# Patient Record
Sex: Male | Born: 1970 | Race: Black or African American | Hispanic: No | Marital: Single | Smoking: Former smoker
Health system: Southern US, Community
[De-identification: ages and names within clinical notes are randomized; demographics above are authoritative.]

## PROBLEM LIST (undated history)

## (undated) DIAGNOSIS — I10 Essential (primary) hypertension: Secondary | ICD-10-CM

## (undated) DIAGNOSIS — N400 Enlarged prostate without lower urinary tract symptoms: Secondary | ICD-10-CM

## (undated) DIAGNOSIS — F1910 Other psychoactive substance abuse, uncomplicated: Secondary | ICD-10-CM

## (undated) HISTORY — PX: HEART TRANSPLANT: SHX268

---

## 2019-08-17 ENCOUNTER — Other Ambulatory Visit: Payer: Self-pay

## 2019-08-17 ENCOUNTER — Emergency Department (HOSPITAL_COMMUNITY)
Admission: EM | Admit: 2019-08-17 | Discharge: 2019-08-17 | Disposition: A | Discharge status: Home / Self Care | Payer: Self-pay | Attending: Emergency Medicine | Admitting: Emergency Medicine

## 2019-08-17 ENCOUNTER — Emergency Department (HOSPITAL_COMMUNITY): Payer: Self-pay

## 2019-08-17 ENCOUNTER — Encounter (HOSPITAL_COMMUNITY): Payer: Self-pay | Admitting: *Deleted

## 2019-08-17 DIAGNOSIS — N134 Hydroureter: Secondary | ICD-10-CM | POA: Insufficient documentation

## 2019-08-17 DIAGNOSIS — N1330 Unspecified hydronephrosis: Secondary | ICD-10-CM | POA: Insufficient documentation

## 2019-08-17 DIAGNOSIS — R112 Nausea with vomiting, unspecified: Secondary | ICD-10-CM | POA: Insufficient documentation

## 2019-08-17 DIAGNOSIS — N1339 Other hydronephrosis: Secondary | ICD-10-CM

## 2019-08-17 DIAGNOSIS — R109 Unspecified abdominal pain: Secondary | ICD-10-CM

## 2019-08-17 DIAGNOSIS — R10817 Generalized abdominal tenderness: Secondary | ICD-10-CM | POA: Insufficient documentation

## 2019-08-17 LAB — LIPASE, BLOOD: Lipase: 37 U/L (ref 11–51)

## 2019-08-17 LAB — URINALYSIS, ROUTINE W REFLEX MICROSCOPIC
Bilirubin Urine: NEGATIVE
Glucose, UA: NEGATIVE mg/dL
Hgb urine dipstick: NEGATIVE
Ketones, ur: 5 mg/dL — AB
Leukocytes,Ua: NEGATIVE
Nitrite: NEGATIVE
Protein, ur: NEGATIVE mg/dL
Specific Gravity, Urine: 1.008 (ref 1.005–1.030)
pH: 5 (ref 5.0–8.0)

## 2019-08-17 LAB — COMPREHENSIVE METABOLIC PANEL
ALT: 47 U/L — ABNORMAL HIGH (ref 0–44)
AST: 61 U/L — ABNORMAL HIGH (ref 15–41)
Albumin: 4.1 g/dL (ref 3.5–5.0)
Alkaline Phosphatase: 42 U/L (ref 38–126)
Anion gap: 12 (ref 5–15)
BUN: 26 mg/dL — ABNORMAL HIGH (ref 6–20)
CO2: 23 mmol/L (ref 22–32)
Calcium: 9 mg/dL (ref 8.9–10.3)
Chloride: 100 mmol/L (ref 98–111)
Creatinine, Ser: 2.08 mg/dL — ABNORMAL HIGH (ref 0.61–1.24)
GFR calc Af Amer: 42 mL/min — ABNORMAL LOW (ref >60)
GFR calc non Af Amer: 36 mL/min — ABNORMAL LOW (ref >60)
Glucose, Bld: 111 mg/dL — ABNORMAL HIGH (ref 70–99)
Potassium: 3.9 mmol/L (ref 3.5–5.1)
Sodium: 135 mmol/L (ref 135–145)
Total Bilirubin: 0.6 mg/dL (ref 0.3–1.2)
Total Protein: 7.2 g/dL (ref 6.5–8.1)

## 2019-08-17 LAB — CBC
HCT: 44.8 % (ref 39.0–52.0)
Hemoglobin: 15.2 g/dL (ref 13.0–17.0)
MCH: 28.4 pg (ref 26.0–34.0)
MCHC: 33.9 g/dL (ref 30.0–36.0)
MCV: 83.6 fL (ref 80.0–100.0)
Platelets: 250 10*3/uL (ref 150–400)
RBC: 5.36 MIL/uL (ref 4.22–5.81)
RDW: 13 % (ref 11.5–15.5)
WBC: 9.2 10*3/uL (ref 4.0–10.5)
nRBC: 0 % (ref 0.0–0.2)

## 2019-08-17 MED ORDER — MORPHINE SULFATE (PF) 4 MG/ML IV SOLN
4.0000 mg | Freq: Once | INTRAVENOUS | Status: AC
  Administered 2019-08-17: 4 mg via INTRAVENOUS
  Filled 2019-08-17: qty 1

## 2019-08-17 MED ORDER — ONDANSETRON HCL 4 MG/2ML IJ SOLN
4.0000 mg | Freq: Once | INTRAMUSCULAR | Status: AC
  Administered 2019-08-17: 4 mg via INTRAVENOUS
  Filled 2019-08-17: qty 2

## 2019-08-17 MED ORDER — SODIUM CHLORIDE 0.9 % IV BOLUS
1000.0000 mL | Freq: Once | INTRAVENOUS | Status: AC
  Administered 2019-08-17: 1000 mL via INTRAVENOUS

## 2019-08-17 MED ORDER — TAMSULOSIN HCL 0.4 MG PO CAPS: 0.4000 mg | ORAL_CAPSULE | Freq: Every day | ORAL | 0 refills | Status: DC

## 2019-08-17 MED ORDER — SODIUM CHLORIDE 0.9% FLUSH
3.0000 mL | Freq: Once | INTRAVENOUS | Status: AC
  Administered 2019-08-17: 3 mL via INTRAVENOUS

## 2019-08-17 NOTE — ED Notes (Signed)
Pt stated that he was able to urinate 2 separate times; he estimates each time to be approximately 10 to 20 mL

## 2019-08-17 NOTE — ED Notes (Signed)
Patient verbalizes understanding of discharge instructions. Opportunity for questioning and answers were provided. Armband removed by staff. Patient discharged from ED.  

## 2019-08-17 NOTE — ED Provider Notes (Addendum)
[facility] EMERGENCY DEPARTMENT Provider Note   CSN: [id number] Arrival date & time: 08/17/19  1427     History Chief Complaint  Patient presents with  . Abdominal Pain    Dylan Chaney is a 49 y.o. male.  49 y.o male with no PMH presents to the ED with a chief complaint of bilateral flank pain, abdominal pain for the past 3 days.  Patient reports sudden onset of symptoms on Thursday night, reports he has been unable to eat, has had multiple episodes of nonbloody, nonbilious emesis.  Describes a sharp sensation from bilateral back radiating into lower abdomen, there is no exacerbating or alleviating factors.  He also endorses difficulty urinating, states he is noted his urine to be darker in color in the past couple of days, has not seen any visible blood.  Has taken Tylenol to help with symptoms without much improvement.  Endorses no fever but does report feeling chills while at night.  No prior surgical history of his abdomen.  No prior history of kidney stones.  No pain no discharge, he is currently not sexually active.  No diarrhea.  The history is provided by the patient and medical records.       History reviewed. No pertinent past medical history.  There are no problems to display for this patient.   History reviewed. No pertinent surgical history.     No family history on file.  Social History   Tobacco Use  . Smoking status: Never Smoker  . Smokeless tobacco: Never Used  Substance Use Topics  . Alcohol use: Never  . Drug use: Not on file    Home Medications Prior to Admission medications   Not on File    Allergies    Patient has no known allergies.  Review of Systems   Review of Systems  Constitutional: Positive for appetite change (Anorexia.) and chills. Negative for fever.  HENT: Negative for sore throat.   Respiratory: Negative for shortness of breath.   Cardiovascular: Negative for chest pain.  Gastrointestinal: Positive for  abdominal pain, nausea and vomiting. Negative for blood in stool, constipation and diarrhea.  Genitourinary: Positive for difficulty urinating and flank pain. Negative for discharge, hematuria, penile pain, penile swelling, scrotal swelling and testicular pain.  Musculoskeletal: Negative for back pain.  Skin: Negative for pallor and wound.  Neurological: Negative for light-headedness and headaches.  All other systems reviewed and are negative.   Physical Exam Updated Vital Signs BP 133/74 (BP Location: Right Arm)   Pulse 63   Temp 99.5 F (37.5 C) (Oral)   Resp 14   Ht 6\' 1"  (1.854 m)   Wt 86.2 kg   SpO2 99%   BMI 25.07 kg/m   Physical Exam Vitals and nursing note reviewed.  Constitutional:      Appearance: He is well-developed. He is not ill-appearing or toxic-appearing.  HENT:     Head: Normocephalic and atraumatic.  Cardiovascular:     Rate and Rhythm: Normal rate.  Pulmonary:     Effort: Pulmonary effort is normal.     Breath sounds: No wheezing or rales.     Comments: No wheezing, rhonchi, rales. Abdominal:     General: Abdomen is flat. Bowel sounds are increased. There is no distension.     Palpations: Abdomen is soft.     Tenderness: There is abdominal tenderness in the right lower quadrant, suprapubic area and left lower quadrant. There is right CVA tenderness and guarding. There is no left  CVA tenderness. Negative signs include McBurney's sign.     Hernia: No hernia is present.     Comments: Bowel sounds are hyperactive, generalized tenderness to palpation on abdomen region, focally more so in the lower abdomen.  Skin:    General: Skin is warm and dry.  Neurological:     Mental Status: He is alert and oriented to person, place, and time.     ED Results / Procedures / Treatments   Labs (all labs ordered are listed, but only abnormal results are displayed) Labs Reviewed  COMPREHENSIVE METABOLIC PANEL - Abnormal; Notable for the following components:       Result Value   Glucose, Bld 111 (*)    BUN 26 (*)    Creatinine, Ser 2.08 (*)    AST 61 (*)    ALT 47 (*)    GFR calc non Af Amer 36 (*)    GFR calc Af Amer 42 (*)    All other components within normal limits  URINALYSIS, ROUTINE W REFLEX MICROSCOPIC - Abnormal; Notable for the following components:   Ketones, ur 5 (*)    All other components within normal limits  LIPASE, BLOOD  CBC    EKG None  Radiology CT Renal Stone Study  Result Date: 08/17/2019 CLINICAL DATA:  Bilateral flank pain and dysuria. EXAM: CT ABDOMEN AND PELVIS WITHOUT CONTRAST TECHNIQUE: Multidetector CT imaging of the abdomen and pelvis was performed following the standard protocol without IV contrast. COMPARISON:  None. FINDINGS: Lower chest: No acute abnormality. Hepatobiliary: No focal liver abnormality is seen. No gallstones, gallbladder wall thickening, or biliary dilatation. Pancreas: Unremarkable. No pancreatic ductal dilatation or surrounding inflammatory changes. Spleen: Normal in size without focal abnormality. Adrenals/Urinary Tract: Adrenal glands are unremarkable. Kidneys are normal in size without focal lesions. Mild to moderate severity bilateral hydronephrosis and hydroureter are seen. There is moderate to marked severity distention of the urinary bladder. Stomach/Bowel: Stomach is within normal limits. Appendix appears normal. No evidence of bowel wall thickening, distention, or inflammatory changes. Noninflamed diverticula are seen throughout the sigmoid colon. Vascular/Lymphatic: There is mild to moderate severity calcification of the abdominal aorta. No enlarged abdominal or pelvic lymph nodes. Reproductive: Moderate to marked severity prostate gland enlargement is noted. Other: No abdominal wall hernia or abnormality. No abdominopelvic ascites. Musculoskeletal: No acute or significant osseous findings. IMPRESSION: 1. Mild to moderate severity bilateral hydronephrosis and hydroureter, with moderate to marked  severity distention of the urinary bladder. 2. Moderate to marked severity prostate gland enlargement. 3. Noninflamed sigmoid diverticulosis. 4. Aortic atherosclerosis. Aortic Atherosclerosis (ICD10-I70.0). Electronically Signed   By: Virgina Norfolk M.D.   On: 08/17/2019 17:50    Procedures Procedures (including critical care time)  Medications Ordered in ED Medications  sodium chloride flush (NS) 0.9 % injection 3 mL (3 mLs Intravenous Given 08/17/19 1707)  sodium chloride 0.9 % bolus 1,000 mL (0 mLs Intravenous Stopped 08/17/19 1812)  morphine 4 MG/ML injection 4 mg (4 mg Intravenous Given 08/17/19 1706)  ondansetron (ZOFRAN) injection 4 mg (4 mg Intravenous Given 08/17/19 1706)  morphine 4 MG/ML injection 4 mg (4 mg Intravenous Given 08/17/19 1847)    ED Course  I have reviewed the triage vital signs and the nursing notes.  Pertinent labs & imaging results that were available during my care of the patient were reviewed by me and considered in my medical decision making (see chart for details).    MDM Rules/Calculators/A&P   Patient with no past medical history presents to  the ED with complaints of bilateral flank pain, lower abdominal pain which began 3 days ago.  Reports his pain is consistent, no alleviating or exacerbating factors.  Has taken Tylenol without improvement in symptoms.  Also endorses darker urine, reports he is having difficulty urinating and reports that he feels like he has to strain.  He has not seen any visible blood in his urine.  Has no prior history of kidney stones, no prior history of UTI.  He is currently not sexually active and has not been in several years, denies any penile drainage or testicular swelling.  During evaluation he is overall nontoxic, ill-appearing.  Does have right CVA tenderness on examination, generalized abdominal tenderness on palpation along with lower abdomen pain.  Lungs are clear to auscultation, rest of his exam is benign.  Urinalysis today  showed no nitrites, leukocytes, signs of infection.  CBC is without any leukocytosis, no signs of anemia.  He has had repetitive vomiting for the past 3 days along with some anorexia and no prior surgery to his abdomen.  Some suspicion for kidney stone versus, appendicitis versus diverticulitis.  He was provided with morphine, fluids, Zofran.   CT Renal showed:  1. Mild to moderate severity bilateral hydronephrosis and  hydroureter, with moderate to marked severity distention of the  urinary bladder.  2. Moderate to marked severity prostate gland enlargement.  3. Noninflamed sigmoid diverticulosis.  4. Aortic atherosclerosis.   6:56 PM Spoke to Dr. Clancy [facility], who recommended insertion of Foley catheter at this time.  He will also need to go home on tamsulosin 0.4 mg for the next 14 days.  Patient is to call the office on Monday morning and schedule an appointment for further discussion of his bladder distention and hydronephrosis management.   10:16 PM delayed in placing catheter on patient, he is refusing procedure at this time.  I have discussed with him that he will likely worsen if we are unable to drain his distended bladder, he would like to wait until he sees a specialist.  I discussed with Dr. Laverle Patter the catheter placement along with start him on tamsulosin. He is refusing care at this time, patient will be leaving the ED AGAINST MEDICAL ADVICE.  Vitals remained stable.   Portions of this note were generated with Scientist, clinical (histocompatibility and immunogenetics). Dictation errors may occur despite best attempts at proofreading.  Final Clinical Impression(s) / ED Diagnoses Final diagnoses:  Bilateral flank pain  Other hydronephrosis    Rx / DC Orders ED Discharge Orders         Ordered    tamsulosin (FLOMAX) 0.4 MG CAPS capsule  Daily,   Status:  Discontinued     08/17/19 1923           Claude Manges, PA-C 08/17/19 2218    Claude Manges, PA-C 08/17/19 2219    Claude Manges,  PA-C 08/17/19 2219    Charlynne Pander, MD 08/17/19 2228

## 2019-08-17 NOTE — ED Notes (Addendum)
Pt refused foley catheter by Joe the RN and this tech who assisted. This tech educated the pt on the purpose of the foley, and the consequences of further bladder complications if the foley was not inserted. When asked if pt felt pain in the abdomen, pt said " yes I noticed it four days ago." When the RN inserts the foley, pt grabs his penis and told us to stop and the sterile field was no longer sterile. Pt is getting dressed.

## 2019-08-17 NOTE — ED Notes (Signed)
Pt transported to CT ?

## 2019-08-17 NOTE — ED Triage Notes (Signed)
The pt is c/o abd pain abd flank pain diff urination with n and v  For 2-3 days

## 2019-08-17 NOTE — Discharge Instructions (Addendum)
Your CT today showed bilateral hydronephrosis.  We attempted to place a catheter to drain the urine, you refused this procedure.  Dr. Vevelyn Royals number is attached to your chart, please schedule an appointment tomorrow in order to be seen this upcoming week.

## 2019-10-15 ENCOUNTER — Encounter: Payer: Self-pay | Admitting: *Deleted

## 2019-10-15 NOTE — Congregational Nurse Program (Signed)
  Dept: [phone]   Congregational Nurse Program Note  Date of Encounter: 10/15/2019  Past Medical History: No past medical history on file.  Encounter Details:  CNP Questionnaire - 10/15/19 1057      Questionnaire   Patient Status Not Applicable    Race Black or African American    Location Patient Served At [employer] Not Applicable    Uninsured Uninsured (NEW 1x/quarter)    Food No food insecurities    Housing/Utilities No permanent housing    Transportation No transportation needs    Interpersonal Safety Yes, feel physically and emotionally safe where you currently live    Medication No medication insecurities    Medical Provider Yes    Referrals Not Applicable    ED Visit Averted Not Applicable    Life-Saving Intervention Made Not Applicable          Client's name was highlighted to see mental health nurse. When writer asked to see client, he reported he had not made an appointment but would have his blood pressure taken. Blood pressure checked WNL. Client sees Lavinia Sharps as his PCP.

## 2020-12-15 ENCOUNTER — Inpatient Hospital Stay (HOSPITAL_COMMUNITY)
Admission: EM | Admit: 2020-12-15 | Discharge: 2021-01-05 | DRG: 215 | Disposition: A | Discharge status: Home Health | Payer: Medicaid Other | Attending: Internal Medicine | Admitting: Internal Medicine

## 2020-12-15 ENCOUNTER — Emergency Department (HOSPITAL_COMMUNITY): Payer: Medicaid Other

## 2020-12-15 ENCOUNTER — Encounter (HOSPITAL_COMMUNITY): Payer: Self-pay | Admitting: Oncology

## 2020-12-15 ENCOUNTER — Other Ambulatory Visit: Payer: Self-pay

## 2020-12-15 DIAGNOSIS — K567 Ileus, unspecified: Secondary | ICD-10-CM

## 2020-12-15 DIAGNOSIS — I509 Heart failure, unspecified: Secondary | ICD-10-CM

## 2020-12-15 DIAGNOSIS — I471 Supraventricular tachycardia: Secondary | ICD-10-CM | POA: Diagnosis present

## 2020-12-15 DIAGNOSIS — Z7901 Long term (current) use of anticoagulants: Secondary | ICD-10-CM

## 2020-12-15 DIAGNOSIS — I130 Hypertensive heart and chronic kidney disease with heart failure and stage 1 through stage 4 chronic kidney disease, or unspecified chronic kidney disease: Secondary | ICD-10-CM | POA: Diagnosis present

## 2020-12-15 DIAGNOSIS — R778 Other specified abnormalities of plasma proteins: Secondary | ICD-10-CM | POA: Diagnosis present

## 2020-12-15 DIAGNOSIS — N1832 Chronic kidney disease, stage 3b: Secondary | ICD-10-CM | POA: Diagnosis present

## 2020-12-15 DIAGNOSIS — R54 Age-related physical debility: Secondary | ICD-10-CM | POA: Diagnosis present

## 2020-12-15 DIAGNOSIS — J9601 Acute respiratory failure with hypoxia: Secondary | ICD-10-CM | POA: Diagnosis not present

## 2020-12-15 DIAGNOSIS — I472 Ventricular tachycardia, unspecified: Secondary | ICD-10-CM | POA: Diagnosis not present

## 2020-12-15 DIAGNOSIS — N170 Acute kidney failure with tubular necrosis: Secondary | ICD-10-CM | POA: Diagnosis present

## 2020-12-15 DIAGNOSIS — I9589 Other hypotension: Secondary | ICD-10-CM | POA: Diagnosis not present

## 2020-12-15 DIAGNOSIS — K7200 Acute and subacute hepatic failure without coma: Secondary | ICD-10-CM

## 2020-12-15 DIAGNOSIS — N189 Chronic kidney disease, unspecified: Secondary | ICD-10-CM | POA: Diagnosis present

## 2020-12-15 DIAGNOSIS — E43 Unspecified severe protein-calorie malnutrition: Secondary | ICD-10-CM | POA: Insufficient documentation

## 2020-12-15 DIAGNOSIS — D62 Acute posthemorrhagic anemia: Secondary | ICD-10-CM | POA: Diagnosis not present

## 2020-12-15 DIAGNOSIS — K5900 Constipation, unspecified: Secondary | ICD-10-CM | POA: Diagnosis not present

## 2020-12-15 DIAGNOSIS — I82403 Acute embolism and thrombosis of unspecified deep veins of lower extremity, bilateral: Secondary | ICD-10-CM | POA: Diagnosis present

## 2020-12-15 DIAGNOSIS — I2699 Other pulmonary embolism without acute cor pulmonale: Secondary | ICD-10-CM | POA: Diagnosis present

## 2020-12-15 DIAGNOSIS — E872 Acidosis, unspecified: Secondary | ICD-10-CM | POA: Diagnosis not present

## 2020-12-15 DIAGNOSIS — E871 Hypo-osmolality and hyponatremia: Secondary | ICD-10-CM | POA: Diagnosis not present

## 2020-12-15 DIAGNOSIS — Z419 Encounter for procedure for purposes other than remedying health state, unspecified: Secondary | ICD-10-CM

## 2020-12-15 DIAGNOSIS — R7989 Other specified abnormal findings of blood chemistry: Secondary | ICD-10-CM | POA: Diagnosis present

## 2020-12-15 DIAGNOSIS — Z5900 Homelessness unspecified: Secondary | ICD-10-CM

## 2020-12-15 DIAGNOSIS — F1910 Other psychoactive substance abuse, uncomplicated: Secondary | ICD-10-CM | POA: Diagnosis present

## 2020-12-15 DIAGNOSIS — I429 Cardiomyopathy, unspecified: Secondary | ICD-10-CM | POA: Diagnosis present

## 2020-12-15 DIAGNOSIS — J9811 Atelectasis: Secondary | ICD-10-CM

## 2020-12-15 DIAGNOSIS — I2694 Multiple subsegmental pulmonary emboli without acute cor pulmonale: Secondary | ICD-10-CM

## 2020-12-15 DIAGNOSIS — I5043 Acute on chronic combined systolic (congestive) and diastolic (congestive) heart failure: Principal | ICD-10-CM | POA: Diagnosis present

## 2020-12-15 DIAGNOSIS — R570 Cardiogenic shock: Secondary | ICD-10-CM | POA: Diagnosis present

## 2020-12-15 DIAGNOSIS — R002 Palpitations: Secondary | ICD-10-CM | POA: Diagnosis present

## 2020-12-15 DIAGNOSIS — Z87891 Personal history of nicotine dependence: Secondary | ICD-10-CM

## 2020-12-15 DIAGNOSIS — K7290 Hepatic failure, unspecified without coma: Secondary | ICD-10-CM

## 2020-12-15 DIAGNOSIS — Z20822 Contact with and (suspected) exposure to covid-19: Secondary | ICD-10-CM | POA: Diagnosis present

## 2020-12-15 DIAGNOSIS — R0789 Other chest pain: Secondary | ICD-10-CM | POA: Diagnosis present

## 2020-12-15 DIAGNOSIS — N400 Enlarged prostate without lower urinary tract symptoms: Secondary | ICD-10-CM | POA: Diagnosis present

## 2020-12-15 DIAGNOSIS — Z79899 Other long term (current) drug therapy: Secondary | ICD-10-CM

## 2020-12-15 DIAGNOSIS — Z452 Encounter for adjustment and management of vascular access device: Secondary | ICD-10-CM

## 2020-12-15 DIAGNOSIS — I82441 Acute embolism and thrombosis of right tibial vein: Secondary | ICD-10-CM | POA: Diagnosis present

## 2020-12-15 DIAGNOSIS — D696 Thrombocytopenia, unspecified: Secondary | ICD-10-CM | POA: Diagnosis not present

## 2020-12-15 DIAGNOSIS — I5082 Biventricular heart failure: Secondary | ICD-10-CM | POA: Diagnosis present

## 2020-12-15 DIAGNOSIS — F1010 Alcohol abuse, uncomplicated: Secondary | ICD-10-CM | POA: Diagnosis present

## 2020-12-15 DIAGNOSIS — I2693 Single subsegmental pulmonary embolism without acute cor pulmonale: Secondary | ICD-10-CM | POA: Diagnosis present

## 2020-12-15 DIAGNOSIS — I493 Ventricular premature depolarization: Secondary | ICD-10-CM | POA: Diagnosis present

## 2020-12-15 DIAGNOSIS — I2721 Secondary pulmonary arterial hypertension: Secondary | ICD-10-CM

## 2020-12-15 DIAGNOSIS — D689 Coagulation defect, unspecified: Secondary | ICD-10-CM | POA: Diagnosis not present

## 2020-12-15 HISTORY — DX: Benign prostatic hyperplasia without lower urinary tract symptoms: N40.0

## 2020-12-15 HISTORY — DX: Other psychoactive substance abuse, uncomplicated: F19.10

## 2020-12-15 LAB — COMPREHENSIVE METABOLIC PANEL
ALT: 202 U/L — ABNORMAL HIGH (ref 0–44)
AST: 126 U/L — ABNORMAL HIGH (ref 15–41)
Albumin: 3.6 g/dL (ref 3.5–5.0)
Alkaline Phosphatase: 38 U/L (ref 38–126)
Anion gap: 12 (ref 5–15)
BUN: 20 mg/dL (ref 6–20)
CO2: 21 mmol/L — ABNORMAL LOW (ref 22–32)
Calcium: 9.5 mg/dL (ref 8.9–10.3)
Chloride: 107 mmol/L (ref 98–111)
Creatinine, Ser: 1.82 mg/dL — ABNORMAL HIGH (ref 0.61–1.24)
GFR, Estimated: 44 mL/min — ABNORMAL LOW (ref >60)
Glucose, Bld: 176 mg/dL — ABNORMAL HIGH (ref 70–99)
Potassium: 4.7 mmol/L (ref 3.5–5.1)
Sodium: 140 mmol/L (ref 135–145)
Total Bilirubin: 4.2 mg/dL — ABNORMAL HIGH (ref 0.3–1.2)
Total Protein: 6.3 g/dL — ABNORMAL LOW (ref 6.5–8.1)

## 2020-12-15 LAB — CBC WITH DIFFERENTIAL/PLATELET
Abs Immature Granulocytes: 0.03 10*3/uL (ref 0.00–0.07)
Basophils Absolute: 0 10*3/uL (ref 0.0–0.1)
Basophils Relative: 1 %
Eosinophils Absolute: 0 10*3/uL (ref 0.0–0.5)
Eosinophils Relative: 0 %
HCT: 45.1 % (ref 39.0–52.0)
Hemoglobin: 15.5 g/dL (ref 13.0–17.0)
Immature Granulocytes: 0 %
Lymphocytes Relative: 18 %
Lymphs Abs: 1.4 10*3/uL (ref 0.7–4.0)
MCH: 29.4 pg (ref 26.0–34.0)
MCHC: 34.4 g/dL (ref 30.0–36.0)
MCV: 85.6 fL (ref 80.0–100.0)
Monocytes Absolute: 1.1 10*3/uL — ABNORMAL HIGH (ref 0.1–1.0)
Monocytes Relative: 14 %
Neutro Abs: 5.2 10*3/uL (ref 1.7–7.7)
Neutrophils Relative %: 67 %
Platelets: 168 10*3/uL (ref 150–400)
RBC: 5.27 MIL/uL (ref 4.22–5.81)
RDW: 14 % (ref 11.5–15.5)
WBC: 7.8 10*3/uL (ref 4.0–10.5)
nRBC: 0 % (ref 0.0–0.2)

## 2020-12-15 LAB — LIPASE, BLOOD: Lipase: 73 U/L — ABNORMAL HIGH (ref 11–51)

## 2020-12-15 LAB — TROPONIN I (HIGH SENSITIVITY)
Troponin I (High Sensitivity): 43 ng/L — ABNORMAL HIGH (ref <18)
Troponin I (High Sensitivity): 47 ng/L — ABNORMAL HIGH (ref <18)

## 2020-12-15 LAB — BRAIN NATRIURETIC PEPTIDE: B Natriuretic Peptide: 2024.7 pg/mL — ABNORMAL HIGH (ref 0.0–100.0)

## 2020-12-15 MED ORDER — IOHEXOL 350 MG/ML SOLN
100.0000 mL | Freq: Once | INTRAVENOUS | Status: AC | PRN
  Administered 2020-12-15: 80 mL via INTRAVENOUS

## 2020-12-15 MED ORDER — ONDANSETRON HCL 4 MG/2ML IJ SOLN
4.0000 mg | Freq: Once | INTRAMUSCULAR | Status: AC
  Administered 2020-12-15: 4 mg via INTRAVENOUS
  Filled 2020-12-15: qty 2

## 2020-12-15 NOTE — ED Provider Notes (Signed)
Emergency Medicine Provider Triage Evaluation Note  Dylan Chaney , a 50 y.o. male  was evaluated in triage.  Pt complains of shortness of breath.  Patient stated symptoms started about 12 weeks ago.  Reports associated nausea/vomiting.  States he was evaluated at [facility] recently and was started on Phenergan but states this does not improve his symptoms.  States his symptoms worsen with any p.o. intake.  Reports upper abdominal pain that radiates through the chest and into the neck.  States he is not a smoker.  Denies a pulmonary history.  Physical Exam  There were no vitals taken for this visit. Gen:   Awake, no distress   Resp:  Normal effort  MSK:   Moves extremities without difficulty  Other:    Medical Decision Making  Medically screening exam initiated at 4:52 PM.  Appropriate orders placed.  Wilberto Console was informed that the remainder of the evaluation will be completed by another provider, this initial triage assessment does not replace that evaluation, and the importance of remaining in the ED until their evaluation is complete.   Placido Sou, PA-C 12/15/20 1652    Cheryll Cockayne, MD 12/15/20 1655

## 2020-12-15 NOTE — ED Triage Notes (Signed)
Per EMS, has not felt well for the last 12 weeks-was seen at [facility] a couple of days ago-was given an antiemetic-spitting up phlegm-states he has not gotten better since he was seen at [facility]

## 2020-12-16 ENCOUNTER — Encounter (HOSPITAL_COMMUNITY): Payer: Self-pay | Admitting: Family Medicine

## 2020-12-16 ENCOUNTER — Inpatient Hospital Stay (HOSPITAL_COMMUNITY): Payer: Medicaid Other

## 2020-12-16 DIAGNOSIS — R570 Cardiogenic shock: Secondary | ICD-10-CM | POA: Diagnosis present

## 2020-12-16 DIAGNOSIS — R0602 Shortness of breath: Secondary | ICD-10-CM | POA: Diagnosis present

## 2020-12-16 DIAGNOSIS — F1010 Alcohol abuse, uncomplicated: Secondary | ICD-10-CM | POA: Diagnosis present

## 2020-12-16 DIAGNOSIS — I5043 Acute on chronic combined systolic (congestive) and diastolic (congestive) heart failure: Secondary | ICD-10-CM | POA: Diagnosis present

## 2020-12-16 DIAGNOSIS — D689 Coagulation defect, unspecified: Secondary | ICD-10-CM | POA: Diagnosis not present

## 2020-12-16 DIAGNOSIS — I509 Heart failure, unspecified: Secondary | ICD-10-CM

## 2020-12-16 DIAGNOSIS — J9601 Acute respiratory failure with hypoxia: Secondary | ICD-10-CM | POA: Diagnosis not present

## 2020-12-16 DIAGNOSIS — I493 Ventricular premature depolarization: Secondary | ICD-10-CM | POA: Diagnosis present

## 2020-12-16 DIAGNOSIS — I2693 Single subsegmental pulmonary embolism without acute cor pulmonale: Secondary | ICD-10-CM | POA: Diagnosis not present

## 2020-12-16 DIAGNOSIS — E871 Hypo-osmolality and hyponatremia: Secondary | ICD-10-CM | POA: Diagnosis not present

## 2020-12-16 DIAGNOSIS — I97638 Postprocedural hematoma of a circulatory system organ or structure following other circulatory system procedure: Secondary | ICD-10-CM | POA: Diagnosis not present

## 2020-12-16 DIAGNOSIS — N183 Chronic kidney disease, stage 3 unspecified: Secondary | ICD-10-CM | POA: Diagnosis not present

## 2020-12-16 DIAGNOSIS — I5082 Biventricular heart failure: Secondary | ICD-10-CM | POA: Diagnosis present

## 2020-12-16 DIAGNOSIS — R778 Other specified abnormalities of plasma proteins: Secondary | ICD-10-CM

## 2020-12-16 DIAGNOSIS — D62 Acute posthemorrhagic anemia: Secondary | ICD-10-CM | POA: Diagnosis not present

## 2020-12-16 DIAGNOSIS — Z20822 Contact with and (suspected) exposure to covid-19: Secondary | ICD-10-CM | POA: Diagnosis present

## 2020-12-16 DIAGNOSIS — I9589 Other hypotension: Secondary | ICD-10-CM | POA: Diagnosis not present

## 2020-12-16 DIAGNOSIS — M79606 Pain in leg, unspecified: Secondary | ICD-10-CM

## 2020-12-16 DIAGNOSIS — N1832 Chronic kidney disease, stage 3b: Secondary | ICD-10-CM | POA: Diagnosis present

## 2020-12-16 DIAGNOSIS — N189 Chronic kidney disease, unspecified: Secondary | ICD-10-CM

## 2020-12-16 DIAGNOSIS — I2694 Multiple subsegmental pulmonary emboli without acute cor pulmonale: Secondary | ICD-10-CM

## 2020-12-16 DIAGNOSIS — I130 Hypertensive heart and chronic kidney disease with heart failure and stage 1 through stage 4 chronic kidney disease, or unspecified chronic kidney disease: Secondary | ICD-10-CM | POA: Diagnosis present

## 2020-12-16 DIAGNOSIS — I472 Ventricular tachycardia, unspecified: Secondary | ICD-10-CM | POA: Diagnosis not present

## 2020-12-16 DIAGNOSIS — D696 Thrombocytopenia, unspecified: Secondary | ICD-10-CM | POA: Diagnosis not present

## 2020-12-16 DIAGNOSIS — I82441 Acute embolism and thrombosis of right tibial vein: Secondary | ICD-10-CM | POA: Diagnosis present

## 2020-12-16 DIAGNOSIS — I429 Cardiomyopathy, unspecified: Secondary | ICD-10-CM | POA: Diagnosis present

## 2020-12-16 DIAGNOSIS — R7989 Other specified abnormal findings of blood chemistry: Secondary | ICD-10-CM | POA: Diagnosis present

## 2020-12-16 DIAGNOSIS — N170 Acute kidney failure with tubular necrosis: Secondary | ICD-10-CM | POA: Diagnosis present

## 2020-12-16 DIAGNOSIS — I471 Supraventricular tachycardia: Secondary | ICD-10-CM | POA: Diagnosis present

## 2020-12-16 DIAGNOSIS — I2699 Other pulmonary embolism without acute cor pulmonale: Secondary | ICD-10-CM | POA: Diagnosis present

## 2020-12-16 DIAGNOSIS — K7200 Acute and subacute hepatic failure without coma: Secondary | ICD-10-CM | POA: Diagnosis not present

## 2020-12-16 DIAGNOSIS — E872 Acidosis, unspecified: Secondary | ICD-10-CM | POA: Diagnosis not present

## 2020-12-16 DIAGNOSIS — F1910 Other psychoactive substance abuse, uncomplicated: Secondary | ICD-10-CM | POA: Diagnosis present

## 2020-12-16 DIAGNOSIS — I82403 Acute embolism and thrombosis of unspecified deep veins of lower extremity, bilateral: Secondary | ICD-10-CM | POA: Diagnosis present

## 2020-12-16 LAB — COMPREHENSIVE METABOLIC PANEL
ALT: 443 U/L — ABNORMAL HIGH (ref 0–44)
AST: 404 U/L — ABNORMAL HIGH (ref 15–41)
Albumin: 3.7 g/dL (ref 3.5–5.0)
Alkaline Phosphatase: 36 U/L — ABNORMAL LOW (ref 38–126)
Anion gap: 12 (ref 5–15)
BUN: 32 mg/dL — ABNORMAL HIGH (ref 6–20)
CO2: 19 mmol/L — ABNORMAL LOW (ref 22–32)
Calcium: 8.9 mg/dL (ref 8.9–10.3)
Chloride: 104 mmol/L (ref 98–111)
Creatinine, Ser: 1.83 mg/dL — ABNORMAL HIGH (ref 0.61–1.24)
GFR, Estimated: 44 mL/min — ABNORMAL LOW (ref >60)
Glucose, Bld: 161 mg/dL — ABNORMAL HIGH (ref 70–99)
Potassium: 4.7 mmol/L (ref 3.5–5.1)
Sodium: 135 mmol/L (ref 135–145)
Total Bilirubin: 4 mg/dL — ABNORMAL HIGH (ref 0.3–1.2)
Total Protein: 6 g/dL — ABNORMAL LOW (ref 6.5–8.1)

## 2020-12-16 LAB — PROTIME-INR
INR: 1.6 — ABNORMAL HIGH (ref 0.8–1.2)
Prothrombin Time: 18.6 seconds — ABNORMAL HIGH (ref 11.4–15.2)

## 2020-12-16 LAB — MAGNESIUM: Magnesium: 2.2 mg/dL (ref 1.7–2.4)

## 2020-12-16 LAB — CBC
HCT: 43.8 % (ref 39.0–52.0)
Hemoglobin: 15 g/dL (ref 13.0–17.0)
MCH: 28.9 pg (ref 26.0–34.0)
MCHC: 34.2 g/dL (ref 30.0–36.0)
MCV: 84.4 fL (ref 80.0–100.0)
Platelets: 163 10*3/uL (ref 150–400)
RBC: 5.19 MIL/uL (ref 4.22–5.81)
RDW: 14 % (ref 11.5–15.5)
WBC: 9.2 10*3/uL (ref 4.0–10.5)
nRBC: 0 % (ref 0.0–0.2)

## 2020-12-16 LAB — RESP PANEL BY RT-PCR (FLU A&B, COVID) ARPGX2
Influenza A by PCR: NEGATIVE
Influenza B by PCR: NEGATIVE
SARS Coronavirus 2 by RT PCR: NEGATIVE

## 2020-12-16 LAB — TSH: TSH: 2.489 u[IU]/mL (ref 0.350–4.500)

## 2020-12-16 LAB — HEPARIN LEVEL (UNFRACTIONATED)
Heparin Unfractionated: 0.5 IU/mL (ref 0.30–0.70)
Heparin Unfractionated: 0.48 IU/mL (ref 0.30–0.70)

## 2020-12-16 LAB — APTT: aPTT: 30 seconds (ref 24–36)

## 2020-12-16 LAB — HIV ANTIBODY (ROUTINE TESTING W REFLEX): HIV Screen 4th Generation wRfx: NONREACTIVE

## 2020-12-16 MED ORDER — SODIUM CHLORIDE 0.9 % IV BOLUS
250.0000 mL | Freq: Once | INTRAVENOUS | Status: AC
  Administered 2020-12-16: 250 mL via INTRAVENOUS

## 2020-12-16 MED ORDER — ACETAMINOPHEN 325 MG PO TABS
650.0000 mg | ORAL_TABLET | Freq: Four times a day (QID) | ORAL | Status: DC | PRN
  Administered 2020-12-17: 650 mg via ORAL
  Filled 2020-12-16: qty 2

## 2020-12-16 MED ORDER — ACETAMINOPHEN 650 MG RE SUPP: 650.0000 mg | Freq: Four times a day (QID) | RECTAL | Status: DC | PRN

## 2020-12-16 MED ORDER — FUROSEMIDE 10 MG/ML IJ SOLN
40.0000 mg | Freq: Once | INTRAMUSCULAR | Status: AC
  Administered 2020-12-16: 40 mg via INTRAVENOUS
  Filled 2020-12-16: qty 4

## 2020-12-16 MED ORDER — PANTOPRAZOLE SODIUM 40 MG PO TBEC
40.0000 mg | DELAYED_RELEASE_TABLET | Freq: Every day | ORAL | Status: DC
  Administered 2020-12-16 – 2021-01-05 (×20): 40 mg via ORAL
  Filled 2020-12-16 (×19): qty 1

## 2020-12-16 MED ORDER — ONDANSETRON HCL 4 MG PO TABS
4.0000 mg | ORAL_TABLET | Freq: Four times a day (QID) | ORAL | Status: DC | PRN
Start: 2020-12-16 — End: 2020-12-30

## 2020-12-16 MED ORDER — FUROSEMIDE 40 MG PO TABS
40.0000 mg | ORAL_TABLET | Freq: Two times a day (BID) | ORAL | Status: DC
  Administered 2020-12-16: 40 mg via ORAL
  Filled 2020-12-16: qty 1

## 2020-12-16 MED ORDER — FENTANYL CITRATE PF 50 MCG/ML IJ SOSY
25.0000 ug | PREFILLED_SYRINGE | INTRAMUSCULAR | Status: DC | PRN
  Administered 2020-12-16 (×3): 25 ug via INTRAVENOUS
  Filled 2020-12-16 (×3): qty 1

## 2020-12-16 MED ORDER — TAMSULOSIN HCL 0.4 MG PO CAPS
0.8000 mg | ORAL_CAPSULE | Freq: Every day | ORAL | Status: DC
  Administered 2020-12-16 – 2020-12-19 (×4): 0.8 mg via ORAL
  Filled 2020-12-16 (×4): qty 2

## 2020-12-16 MED ORDER — HEPARIN (PORCINE) 25000 UT/250ML-% IV SOLN
750.0000 [IU]/h | INTRAVENOUS | Status: DC
  Administered 2020-12-16 – 2020-12-20 (×4): 1500 [IU]/h via INTRAVENOUS
  Administered 2020-12-21: 1300 [IU]/h via INTRAVENOUS
  Filled 2020-12-16 (×9): qty 250

## 2020-12-16 MED ORDER — HEPARIN BOLUS VIA INFUSION
4000.0000 [IU] | Freq: Once | INTRAVENOUS | Status: AC
  Administered 2020-12-16: 4000 [IU] via INTRAVENOUS
  Filled 2020-12-16: qty 4000

## 2020-12-16 MED ORDER — ONDANSETRON HCL 4 MG/2ML IJ SOLN
4.0000 mg | Freq: Four times a day (QID) | INTRAMUSCULAR | Status: DC | PRN
Start: 2020-12-16 — End: 2020-12-30
  Administered 2020-12-17 – 2020-12-30 (×8): 4 mg via INTRAVENOUS
  Filled 2020-12-16 (×8): qty 2

## 2020-12-16 NOTE — Plan of Care (Signed)
  Problem: Education: Goal: Knowledge of General Education information will improve Description Including pain rating scale, medication(s)/side effects and non-pharmacologic comfort measures Outcome: Progressing   

## 2020-12-16 NOTE — Progress Notes (Addendum)
PROGRESS NOTE    Dylan Chaney  RWE:[id number] DOB: 1970-03-25 DOA: 12/15/2020 PCP: Lavinia Sharps, NP    Brief Narrative:   Dylan Chaney is a 50 year old male, presenting with shortness of breath, diagnosed with bilateral PE.  50 year old male, hx of htn, ckd, prostate hypertrophy, presented with new onset shortness of breath. He reports several weeks of difficult fluid intake, followed by leg pain with exertion, does not report the current lower extremity edema being present previously, he reports night fevers, chills, denies night sweats, and reports recent fever 10/3 and 10/4. He initially presented tachycardic, tachypneic, and lower normotensive to slightly hypotensive.  Current labs, 10/6. Na 135, k 4.7, cl 104, co2 19, glucose 161, crt 1.83, bun 32, mag 2.2, no notable cbc findings, wbc 9.2, hgb 15, plt 163.   BNP 2024 1700 10/5  Ast 404, alt 443, tbili 4  Covid negative  CTA chest, reveals Moderate amount of bilateral pulmonary embolism, Moderate severity cardiomegaly, Small right pleural effusion  Ekg, sinus tachycardia, multifocal pvc's not seen on 12 lead are prominent on monitor at bedside, frequent couplets, occasional trigemini. Poor r wave progression, and lvh.  Currently 98% on 3lpm o2 via nasal canula, not on home oxygen.  Heparin bolus given in ED and drip currently.  Cardiac Echo ordered and not performed as of 1100 10/6  Ct abd showed: Mass effect is seen involving the floor the urinary bladder by a markedly enlarged prostate gland.   LE [place] with several DVT    Assessment & Plan:   Principal Problem:   Pulmonary Embolism Active Problems:   Acute Congestive Heart Failure   Elevated LFTs   Elevated troponin   Chronic renal insufficiency    1: PE Current PESI score 181, high risk 30 day mortality New oxygen requirement JVD indicative of right heart strain Tachycardia and ectopic beats more prevalent Lower extremity pain with gastrocnemius  palpation, positive Homan sign  Plan: -avoid lasix that was considered with elevated bnp and LE edema, probably secondary to LE DVT -LE US shows DVT's -Continue Heparin -Consider consulting vascular surgery if DVT load is significant or worsening progression of PE   2: Acute CHF Probably acute in the setting of PE Signs of long term htn and development of hypertrophy Currently no signs of pulmonary edema JVD present Elevated BNP  Plan: -monitor -avoiding diuretics currently with PE -Cardiac echo ordered and awaiting  3:elevated LFT  1 year ago, ast 61, alt 47, tbili 0.6  Initial 1700 10/5 ast 126, alt 202, tbili 4.2   Current 0634 10/6 ast404, alt 443, tbili 4  Likely due to hypoperfusion secondary to PE   Plan: Monitor, trend, resolve PE   4:elevated troponin  43, 47,  secondary to PE  Plan: Monitor if any new changes in chest pain   5: chronic renal insufficiency: Previous creatinine 1 year ago, 2.08 Current 1.83   Plan: Renal dosage if required of indicated meds Monitor Revisit after PE resolution    Patient continue to be at high risk for worsening heart failure, worsening hepatic function, worsening renal function, death  Status is: Inpatient  Remains inpatient appropriate because:Hemodynamically unstable, Ongoing active pain requiring inpatient pain management, Ongoing diagnostic testing needed not appropriate for outpatient work up, IV treatments appropriate due to intensity of illness or inability to take PO, and Inpatient level of care appropriate due to severity of illness  Dispo: The patient is from: Home  Anticipated d/c is to: Home              Patient currently is not medically stable to d/c.   Difficult to place patient No       DVT prophylaxis: heparin Code Status:   full Family Communication:     Nutrition Status:  Heart healthy  Not interested currently with Nausea and vomiting     Skin  Documentation: No obvious rashes, lesions, wounds   Consultants:  Cardiology Consider Vascular after more imaging  Procedures:  CTA for pe Cardiac Echo LE [place] for dvt   Subjective: Uncomfortable short of breath, chest pain, nausea, vomiting, ill appearing.  Objective: Vitals:   12/16/20 0517 12/16/20 0530 12/16/20 0700 12/16/20 0827  BP: (!) 89/74 94/76 93/72  108/76  Pulse: (!) 103 (!) 101 83 (!) 102  Resp: 17 14 16 14   Temp:      TempSrc:      SpO2: 96% 97% 97% 94%  Weight:      Height:       No intake or output data in the 24 hours ending 12/16/20 1010 Filed Weights   12/15/20 1653  Weight: 92.1 kg    Examination:   General: Acutely ill appearing, pink, cool, dry to the touch, responsive, uncomfortable  Neurology: Awake and alert, non focal   E ENT: no pallor, slight ictarus, mucous membranes moist  Cardiovascular: Significant JVD, no s3, no murmur noted, no rubs, no gallops, regular rate with irregularities on noted ectopic beats, 1 lower ext edema. Positive Homan  Pulmonary: vesicular breath sounds bilaterally, adequate air movement, no wheezing, rhonchi or rales.  Gastrointestinal. Abdomen flat, no organomegaly, LUQ pain and tender to palpation, guarding of abd, RUQ tender to palpation, lower quadrants nontender  Skin. No rashes  Musculoskeletal: no joint deformities     Data Reviewed: I have personally reviewed following labs and imaging studies  CBC: Recent Labs  Lab 12/15/20 1716 12/16/20 0634  WBC 7.8 9.2  NEUTROABS 5.2  --   HGB 15.5 15.0  HCT 45.1 43.8  MCV 85.6 84.4  PLT 168 163   Basic Metabolic Panel: Recent Labs  Lab 12/15/20 1716 12/16/20 0634  NA 140 135  K 4.7 4.7  CL 107 104  CO2 21* 19*  GLUCOSE 176* 161*  BUN 20 32*  CREATININE 1.82* 1.83*  CALCIUM 9.5 8.9  MG  --  2.2   GFR: Estimated Creatinine Clearance: 52.4 mL/min (A) (by C-G formula based on SCr of 1.83 mg/dL (H)). Liver Function Tests: Recent Labs  Lab  12/15/20 1716 12/16/20 0634  AST 126* 404*  ALT 202* 443*  ALKPHOS 38 36*  BILITOT 4.2* 4.0*  PROT 6.3* 6.0*  ALBUMIN 3.6 3.7   Recent Labs  Lab 12/15/20 1716  LIPASE 73*   No results for input(s): AMMONIA in the last 168 hours. Coagulation Profile: Recent Labs  Lab 12/16/20 0040  INR 1.6*   Cardiac Enzymes: No results for input(s): CKTOTAL, CKMB, CKMBINDEX, TROPONINI in the last 168 hours. BNP (last 3 results) No results for input(s): PROBNP in the last 8760 hours. HbA1C: No results for input(s): HGBA1C in the last 72 hours. CBG: No results for input(s): GLUCAP in the last 168 hours. Lipid Profile: No results for input(s): CHOL, HDL, LDLCALC, TRIG, CHOLHDL, LDLDIRECT in the last 72 hours. Thyroid Function Tests: Recent Labs    12/16/20 0827  TSH 2.489   Anemia Panel: No results for input(s): VITAMINB12, FOLATE, FERRITIN, TIBC, IRON, RETICCTPCT in the  last 72 hours.    Radiology Studies: I have reviewed all of the imaging during this hospital visit personally     Scheduled Meds:  furosemide  40 mg Oral BID   pantoprazole  40 mg Oral Daily   tamsulosin  0.8 mg Oral QHS   Continuous Infusions:  heparin 1,500 Units/hr (12/16/20 0309)     LOS: 0 days        Randell Loop, student

## 2020-12-16 NOTE — ED Provider Notes (Signed)
[facility]-EMERGENCY DEPT Provider Note   CSN: [id number] Arrival date & time: 12/15/20  1627     History Chief Complaint  Patient presents with  . Chest Pain    Dylan Chaney is a 50 y.o. male here presenting with chest pain and abdominal pain.  Patient has been having chest pain and also abdominal pain for the last several weeks.  Patient actually went to [facility] several days ago and was thought to have stomach virus and was prescribed Phenergan which did not help.  He states that the pain is in the upper abdomen and radiates to the chest.  Patient has shortness of breath as well.  Patient states that his symptoms got worse so he came to the ER for evaluation.  Patient was noted to be tachycardic in triage.  The history is provided by the patient.      History reviewed. No pertinent past medical history.  There are no problems to display for this patient.   History reviewed. No pertinent surgical history.     No family history on file.  Social History   Tobacco Use  . Smoking status: Never  . Smokeless tobacco: Never  Vaping Use  . Vaping Use: Never used  Substance Use Topics  . Alcohol use: Not Currently  . Drug use: Not Currently    Home Medications Prior to Admission medications   Not on File    Allergies    Patient has no known allergies.  Review of Systems   Review of Systems  Respiratory:  Positive for shortness of breath.   Cardiovascular:  Positive for chest pain.  Gastrointestinal:  Positive for abdominal pain.  All other systems reviewed and are negative.  Physical Exam Updated Vital Signs BP 108/82   Pulse (!) 122   Temp 97.7 F (36.5 C) (Oral)   Resp 16   Ht 6' (1.829 m)   Wt 92.1 kg   SpO2 95%   BMI 27.53 kg/m   Physical Exam Vitals and nursing note reviewed.  Constitutional:      Comments: Chronically ill-appearing  HENT:     Head: Normocephalic.  Eyes:     Extraocular Movements: Extraocular movements  intact.     Pupils: Pupils are equal, round, and reactive to light.  Cardiovascular:     Rate and Rhythm: Regular rhythm. Tachycardia present.     Heart sounds: Normal heart sounds.  Pulmonary:     Comments: Bibasilar crackles. Abdominal:     Palpations: Abdomen is soft.     Comments: Mild epigastric tenderness  Musculoskeletal:        General: Normal range of motion.     Cervical back: Normal range of motion and neck supple.     Comments: 1+ edema bilaterally  Skin:    General: Skin is warm.     Capillary Refill: Capillary refill takes less than 2 seconds.  Neurological:     Mental Status: He is alert.  Psychiatric:        Mood and Affect: Mood normal.        Behavior: Behavior normal.    ED Results / Procedures / Treatments   Labs (all labs ordered are listed, but only abnormal results are displayed) Labs Reviewed  COMPREHENSIVE METABOLIC PANEL - Abnormal; Notable for the following components:      Result Value   CO2 21 (*)    Glucose, Bld 176 (*)    Creatinine, Ser 1.82 (*)    Total Protein 6.3 (*)  AST 126 (*)    ALT 202 (*)    Total Bilirubin 4.2 (*)    GFR, Estimated 44 (*)    All other components within normal limits  CBC WITH DIFFERENTIAL/PLATELET - Abnormal; Notable for the following components:   Monocytes Absolute 1.1 (*)    All other components within normal limits  LIPASE, BLOOD - Abnormal; Notable for the following components:   Lipase 73 (*)    All other components within normal limits  BRAIN NATRIURETIC PEPTIDE - Abnormal; Notable for the following components:   B Natriuretic Peptide 2,024.7 (*)    All other components within normal limits  TROPONIN I (HIGH SENSITIVITY) - Abnormal; Notable for the following components:   Troponin I (High Sensitivity) 43 (*)    All other components within normal limits  TROPONIN I (HIGH SENSITIVITY) - Abnormal; Notable for the following components:   Troponin I (High Sensitivity) 47 (*)    All other components  within normal limits  APTT  PROTIME-INR  CBC    EKG EKG Interpretation  Date/Time:  Wednesday December 15 2020 22:29:39 EDT Ventricular Rate:  115 PR Interval:  160 QRS Duration: 96 QT Interval:  352 QTC Calculation: 487 R Axis:   113 Text Interpretation: Sinus tachycardia Ventricular premature complex Probable left atrial enlargement Anterior infarct, old Nonspecific T abnormalities, lateral leads No previous ECGs available Confirmed by Richardean Canal ([id number]) on 12/15/2020 10:56:17 PM  Radiology DG Chest 2 View  Result Date: 12/15/2020 CLINICAL DATA:  Shortness of breath EXAM: CHEST - 2 VIEW COMPARISON:  None. FINDINGS: No pleural effusion. Cardiomegaly with globular cardiac configuration. No focal airspace disease or pneumothorax. IMPRESSION: Cardiomegaly with globular cardiac configuration which may be due to multi chamber enlargement or pericardial effusion. Electronically Signed   By: Jasmine Pang M.D.   On: 12/15/2020 17:41   CT Angio Chest PE W and/or Wo Contrast  Result Date: 12/16/2020 CLINICAL DATA:  Shortness of breath. EXAM: CT ANGIOGRAPHY CHEST WITH CONTRAST TECHNIQUE: Multidetector CT imaging of the chest was performed using the standard protocol during bolus administration of intravenous contrast. Multiplanar CT image reconstructions and MIPs were obtained to evaluate the vascular anatomy. CONTRAST:  51mL OMNIPAQUE IOHEXOL 350 MG/ML SOLN COMPARISON:  None. FINDINGS: Cardiovascular: A moderate amount of intraluminal low attenuation is seen within anterior upper lobe branches of the bilateral pulmonary arteries (axial CT images 30 through 33 and 40 through 42, CT series 4) and multiple lower lobe branches of the bilateral pulmonary arteries. There is mild to moderate severity cardiomegaly. No associated right heart strain or saddle embolus is identified. No pericardial effusion. Mediastinum/Nodes: No enlarged mediastinal, hilar, or axillary lymph nodes. Thyroid gland, trachea, and  esophagus demonstrate no significant findings. Lungs/Pleura: Very mild areas of atelectasis are seen within the bilateral lung bases. There is a small right pleural effusion. No pneumothorax is identified. Upper Abdomen: A 1.3 cm diameter cyst is seen within the posterior aspect of the mid right kidney. Musculoskeletal: No chest wall abnormality. No acute or significant osseous findings. Review of the MIP images confirms the above findings. IMPRESSION: 1. Moderate amount of bilateral pulmonary embolism. 2. Small right pleural effusion. 3. Moderate severity cardiomegaly. Electronically Signed   By: Aram Candela M.D.   On: 12/16/2020 00:29   CT ABDOMEN PELVIS W CONTRAST  Result Date: 12/16/2020 CLINICAL DATA:  Abdominal distension and shortness of breath. EXAM: CT ABDOMEN AND PELVIS WITH CONTRAST TECHNIQUE: Multidetector CT imaging of the abdomen and pelvis was performed using the  standard protocol following bolus administration of intravenous contrast. CONTRAST:  60mL OMNIPAQUE IOHEXOL 350 MG/ML SOLN COMPARISON:  August 17, 2019 FINDINGS: Lower chest: Intraluminal filling defects are seen involving multiple lower lobe branches of the bilateral pulmonary arteries. There is a small right pleural effusion. Mild posterolateral right basilar atelectasis is noted. Moderate severity cardiomegaly is noted. Hepatobiliary: No focal liver abnormality is seen. No gallstones, gallbladder wall thickening, or biliary dilatation. Pancreas: Unremarkable. No pancreatic ductal dilatation or surrounding inflammatory changes. Spleen: Normal in size without focal abnormality. Adrenals/Urinary Tract: Adrenal glands are unremarkable. Kidneys are normal in size, without renal calculi or hydronephrosis. A 1.4 cm diameter simple cyst is seen within the posterior aspect of the mid right kidney. Mass effect is seen involving the floor the urinary bladder by a markedly enlarged prostate gland. Stomach/Bowel: Stomach is within normal limits.  Appendix appears normal. No evidence of bowel wall thickening, distention, or inflammatory changes. Noninflamed diverticula are seen throughout the large bowel. Vascular/Lymphatic: Aortic atherosclerosis. No enlarged abdominal or pelvic lymph nodes. Reproductive: The prostate gland is markedly enlarged with extension into the base of the urinary bladder. This is increased in size when compared to the prior exam. Other: No abdominal wall hernia or abnormality. A small amount of posterior pelvic free fluid is seen. Musculoskeletal: No acute or significant osseous findings. IMPRESSION: 1. Multiple bilateral lower lobe pulmonary emboli. 2. Small right pleural effusion. 3. Moderate severity cardiomegaly. 4. Colonic diverticulosis. 5. Markedly enlarged prostate gland. 6. Small amount of posterior pelvic free fluid. Aortic Atherosclerosis (ICD10-I70.0). Electronically Signed   By: Aram Candela M.D.   On: 12/16/2020 00:34    Procedures Procedures   CRITICAL CARE Performed by: Richardean Canal   Total critical care time: 30 minutes  Critical care time was exclusive of separately billable procedures and treating other patients.  Critical care was necessary to treat or prevent imminent or life-threatening deterioration.  Critical care was time spent personally by me on the following activities: development of treatment plan with patient and/or surrogate as well as nursing, discussions with consultants, evaluation of patient's response to treatment, examination of patient, obtaining history from patient or surrogate, ordering and performing treatments and interventions, ordering and review of laboratory studies, ordering and review of radiographic studies, pulse oximetry and re-evaluation of patient's condition.    EMERGENCY DEPARTMENT [place] CARDIAC EXAM "Study: Limited Ultrasound of the Heart and Pericardium"  INDICATIONS:Chest pain Multiple views of the heart and pericardium were obtained in real-time with a  multi-frequency probe.  PERFORMED ST:MHDQQI IMAGES ARCHIVED?: Yes LIMITATIONS:  Body habitus VIEWS USED: Subcostal 4 chamber, Parasternal long axis, and Parasternal short axis INTERPRETATION: Probable elevated CVP and Decreased contractility EF around 20 %    Medications Ordered in ED Medications  heparin bolus via infusion 4,000 Units (has no administration in time range)  heparin ADULT infusion 100 units/mL (25000 units/25mL) (has no administration in time range)  ondansetron (ZOFRAN) injection 4 mg (4 mg Intravenous Given 12/15/20 2336)  iohexol (OMNIPAQUE) 350 MG/ML injection 100 mL (80 mLs Intravenous Contrast Given 12/15/20 2359)  furosemide (LASIX) injection 40 mg (40 mg Intravenous Given 12/16/20 0030)    ED Course  I have reviewed the triage vital signs and the nursing notes.  Pertinent labs & imaging results that were available during my care of the patient were reviewed by me and considered in my medical decision making (see chart for details).    MDM Rules/Calculators/A&P  Dylan Chaney is a 50 y.o. male here presenting with shortness of breath and epigastric pain.  Patient has been vomiting but he has some crackles bilateral bases. His chest x-ray showed possible pericardial effusion and I did a bedside echo and his EF appears to be around 20%.  I am concerned that his symptoms likely from new onset heart failure.  We will get a CTA chest to further assess and will get CT abdomen pelvis as well  1:02 AM CT showed bilateral PEs.  He also has cardiomegaly as well.  He also has pleural effusion.  Patient's BNP is 2000 and troponin is mildly elevated.  Patient started on heparin and given Lasix.  Hospitalist to admit for new onset heart failure likely from PE.   Final Clinical Impression(s) / ED Diagnoses Final diagnoses:  None    Rx / DC Orders ED Discharge Orders     None        Charlynne Pander, MD 12/16/20 [phone]

## 2020-12-16 NOTE — Progress Notes (Signed)
   12/16/20 2100  Assess: MEWS Score  Temp (!) 97.5 F (36.4 C)  BP (!) 87/62  Pulse Rate (!) 103  ECG Heart Rate (!) 102  Resp 20  Assess: MEWS Score  MEWS Temp 0  MEWS Systolic 1  MEWS Pulse 1  MEWS RR 0  MEWS LOC 0  MEWS Score 2  MEWS Score Color Yellow  Assess: if the MEWS score is Yellow or Red  Were vital signs taken at a resting state? Yes  Focused Assessment No change from prior assessment  Does the patient meet 2 or more of the SIRS criteria? No  MEWS guidelines implemented *See Row Information* No, vital signs rechecked  Treat  MEWS Interventions Other (Comment) (Notified charge nurse)  Pain Scale 0-10  Pain Score 0  Take Vital Signs  Increase Vital Sign Frequency  Yellow: Q 2hr X 2 then Q 4hr X 2, if remains yellow, continue Q 4hrs  Escalate  MEWS: Escalate Yellow: discuss with charge nurse/RN and consider discussing with provider and RRT  Notify: Charge Nurse/RN  Name of Charge Nurse/RN Notified Tam RN  Date Charge Nurse/RN Notified 12/16/20  Time Charge Nurse/RN Notified 2230  Assess: SIRS CRITERIA  SIRS Temperature  0  SIRS Pulse 1  SIRS Respirations  0  SIRS WBC 0  SIRS Score Sum  1

## 2020-12-16 NOTE — Plan of Care (Signed)
  Problem: Education: Goal: Knowledge of General Education information will improve Description: Including pain rating scale, medication(s)/side effects and non-pharmacologic comfort measures Outcome: Progressing   Problem: Health Behavior/Discharge Planning: Goal: Ability to manage health-related needs will improve Outcome: Progressing   Problem: Clinical Measurements: Goal: Diagnostic test results will improve Outcome: Progressing   

## 2020-12-16 NOTE — Progress Notes (Addendum)
ANTICOAGULATION CONSULT NOTE - Follow Up Consult  Pharmacy Consult for Heparin Indication: pulmonary embolus  No Known Allergies  Patient Measurements: Height: 6' (182.9 cm) Weight: 92.1 kg (203 lb) IBW/kg (Calculated) : 77.6 Heparin Dosing Weight: 92.1 kg  Vital Signs: BP: 108/76 (10/06 0827) Pulse Rate: 102 (10/06 0827)  Labs: Recent Labs    12/15/20 1716 12/15/20 2256 12/16/20 0040 12/16/20 0634 12/16/20 0827  HGB 15.5  --   --  15.0  --   HCT 45.1  --   --  43.8  --   PLT 168  --   --  163  --   APTT  --   --  30  --   --   LABPROT  --   --  18.6*  --   --   INR  --   --  1.6*  --   --   HEPARINUNFRC  --   --   --   --  0.50  CREATININE 1.82*  --   --  1.83*  --   TROPONINIHS 43* 47*  --   --   --     Estimated Creatinine Clearance: 52.4 mL/min (A) (by C-G formula based on SCr of 1.83 mg/dL (H)).   Medications:  Infusions:   heparin 1,500 Units/hr (12/16/20 0309)    Assessment: 50 yo male presents to ED on 10/5 with complains of SOB, chest pain, abdominal pain, N/V.  CT showed bilateral PEs.  Pharmacy consulted to dose heparin drip for PE.  No prior AC noted.   Today, 12/16/2020: Heparin level 0.5, therapeutic on heparin 1500 unit/hr CBC stable, WNL No bleeding or IV complications reported by RN Scr elevated, stable at 1.83 AST/ALT increased to 404/443  Goal of Therapy:  Heparin level 0.3-0.7 units/ml Monitor platelets by anticoagulation protocol: Yes   Plan:  Continue heparin IV infusion at 1500 units/hr Confirmatory Heparin level in 6 hours Daily heparin level and CBC   Lynann Beaver PharmD, BCPS Clinical Pharmacist [facility] main pharmacy [phone] 12/16/2020 9:51 AM    Addendum: Confirmatory heparin level 0.48 remains therapeutic on heparin 1500 units/hr. LE dopplers positive for bilateral DVTs No new bleeding or complications reported.   Plan: Continue heparin IV infusion at 1500 units/hr Daily heparin level and CBC Follow up long-term  anticoagulation plans.   Lynann Beaver PharmD, BCPS Clinical Pharmacist [facility] main pharmacy [phone] 12/16/2020 4:44 PM

## 2020-12-16 NOTE — Progress Notes (Signed)
BLE venous duplex has been completed.  Dr. Ella Jubilee given preliminary findings through secure chat.   Results can be found under chart review under CV PROC. 12/16/2020 12:26 PM Dreyson Mishkin RVT, RDMS

## 2020-12-16 NOTE — H&P (Signed)
History and Physical    Dylan Chaney VQM:[id number] DOB: 01/04/1971 DOA: 12/15/2020  PCP: Lavinia Sharps, NP   Patient coming from: Home   Chief Complaint: SOB, chest and abdominal pain  HPI: Dylan Chaney is a 50 y.o. male with medical history significant for remote drug and alcohol abuse, prostate enlargement, and renal insufficiency, now presenting to the emergency department with shortness of breath and pain all over, most notably in his chest and abdomen.  Patient reports that he developed shortness of breath and pain roughly 3 months ago.  He has had a cough productive of clear sputum but no fevers or chills.  He reports vague discomfort involving the upper abdomen, worse with any oral intake, and associated with nausea, few episodes of nonbloody vomiting, and poor appetite.  He also describes pain in his low back as well as pain with swelling in the bilateral lower legs.  All symptoms began roughly 3 months ago.  He denies any known history of heart or lung disease.  Reports that he previously used excessive amounts of alcohol but not in the past 7 or 8 years.  He has not used any illicit substances in a few years.  He has had some palpitations over the past 3 months but no loss of consciousness.  ED Course: Upon arrival to the ED, patient is found to be afebrile, saturating well on room air, mildly tachycardic, and with blood pressure 180/82.  EKG features sinus tachycardia 315 and PVCs.  Chemistry panel notable for creatinine 1.82, AST 126, ALT 202, and total bilirubin 4.2.  INR is 1.6, troponin 43 and then 47, and BNP 2025.  CTA chest reveals multiple bilateral lower lobe PEs without right heart strain.  CTA is also notable for cardiomegaly of moderate severity and small right pleural effusion.  CT of the abdomen and pelvis demonstrates markedly enlarged prostate without hydronephrosis.  Patient was treated with Zofran, IV Lasix, and started on IV heparin in the ED.  Review of Systems:   All other systems reviewed and apart from HPI, are negative.  Past Medical History:  Diagnosis Date   Prostate enlargement    Substance abuse (HCC)     History reviewed. No pertinent surgical history.  Social History:   reports that he has quit smoking. His smoking use included cigarettes. He has never used smokeless tobacco. He reports that he does not currently use alcohol. He reports that he does not currently use drugs after having used the following drugs: Cocaine and Marijuana.  No Known Allergies  Family History  Problem Relation Age of Onset   Sudden Cardiac Death Neg Hx      Prior to Admission medications   Medication Sig Start Date End Date Taking? Authorizing Provider  omeprazole (PRILOSEC) 20 MG capsule Take 20 mg by mouth daily. 12/06/20  Yes [provider]  promethazine (PHENERGAN) 25 MG tablet Take 25 mg by mouth 4 (four) times daily as needed. 12/06/20  Yes [provider]  tamsulosin (FLOMAX) 0.4 MG CAPS capsule Take 0.8 mg by mouth at bedtime. 11/23/20  Yes [provider]    Physical Exam: Vitals:   12/16/20 0015 12/16/20 0100 12/16/20 0130 12/16/20 0200  BP: 108/82 93/74 103/84 98/85  Pulse: (!) 122 (!) 113 (!) 112 (!) 111  Resp: 16 (!) 25 (!) 22 (!) 28  Temp:      TempSrc:      SpO2: 95% 93% 95% 93%  Weight:      Height:  Constitutional: NAD, calm  Eyes: PERTLA, lids and conjunctivae normal ENMT: Mucous membranes are moist. Posterior pharynx clear of any exudate or lesions.   Neck: supple, no masses  Respiratory:  no wheezing. No accessory muscle use.  Cardiovascular: S1 & S2 heard, regular rate and rhythm. Bilateral lower extremity swelling. Neck veins are distended. Abdomen: No distension, no tenderness, soft. Bowel sounds active.  Musculoskeletal: no clubbing / cyanosis. No joint deformity upper and lower extremities.   Skin: no significant rashes, lesions, ulcers. Warm, dry, well-perfused. Neurologic: CN 2-12  grossly intact. Moving all extremities. Alert and oriented.  Psychiatric: Pleasant. Cooperative.    Labs and Imaging on Admission: I have personally reviewed following labs and imaging studies  CBC: Recent Labs  Lab 12/15/20 1716  WBC 7.8  NEUTROABS 5.2  HGB 15.5  HCT 45.1  MCV 85.6  PLT 168   Basic Metabolic Panel: Recent Labs  Lab 12/15/20 1716  NA 140  K 4.7  CL 107  CO2 21*  GLUCOSE 176*  BUN 20  CREATININE 1.82*  CALCIUM 9.5   GFR: Estimated Creatinine Clearance: 52.7 mL/min (A) (by C-G formula based on SCr of 1.82 mg/dL (H)). Liver Function Tests: Recent Labs  Lab 12/15/20 1716  AST 126*  ALT 202*  ALKPHOS 38  BILITOT 4.2*  PROT 6.3*  ALBUMIN 3.6   Recent Labs  Lab 12/15/20 1716  LIPASE 73*   No results for input(s): AMMONIA in the last 168 hours. Coagulation Profile: Recent Labs  Lab 12/16/20 0040  INR 1.6*   Cardiac Enzymes: No results for input(s): CKTOTAL, CKMB, CKMBINDEX, TROPONINI in the last 168 hours. BNP (last 3 results) No results for input(s): PROBNP in the last 8760 hours. HbA1C: No results for input(s): HGBA1C in the last 72 hours. CBG: No results for input(s): GLUCAP in the last 168 hours. Lipid Profile: No results for input(s): CHOL, HDL, LDLCALC, TRIG, CHOLHDL, LDLDIRECT in the last 72 hours. Thyroid Function Tests: No results for input(s): TSH, T4TOTAL, FREET4, T3FREE, THYROIDAB in the last 72 hours. Anemia Panel: No results for input(s): VITAMINB12, FOLATE, FERRITIN, TIBC, IRON, RETICCTPCT in the last 72 hours. Urine analysis:    Component Value Date/Time   COLORURINE YELLOW 08/17/2019 1623   APPEARANCEUR CLEAR 08/17/2019 1623   LABSPEC 1.008 08/17/2019 1623   PHURINE 5.0 08/17/2019 1623   GLUCOSEU NEGATIVE 08/17/2019 1623   HGBUR NEGATIVE 08/17/2019 1623   BILIRUBINUR NEGATIVE 08/17/2019 1623   KETONESUR 5 (A) 08/17/2019 1623   PROTEINUR NEGATIVE 08/17/2019 1623   NITRITE NEGATIVE 08/17/2019 1623   LEUKOCYTESUR  NEGATIVE 08/17/2019 1623   Sepsis Labs: @LABRCNTIP (procalcitonin:4,lacticidven:4) )No results found for this or any previous visit (from the past 240 hour(s)).   Radiological Exams on Admission: DG Chest 2 View  Result Date: 12/15/2020 CLINICAL DATA:  Shortness of breath EXAM: CHEST - 2 VIEW COMPARISON:  None. FINDINGS: No pleural effusion. Cardiomegaly with globular cardiac configuration. No focal airspace disease or pneumothorax. IMPRESSION: Cardiomegaly with globular cardiac configuration which may be due to multi chamber enlargement or pericardial effusion. Electronically Signed   By: 02/14/2021 M.D.   On: 12/15/2020 17:41   CT Angio Chest PE W and/or Wo Contrast  Result Date: 12/16/2020 CLINICAL DATA:  Shortness of breath. EXAM: CT ANGIOGRAPHY CHEST WITH CONTRAST TECHNIQUE: Multidetector CT imaging of the chest was performed using the standard protocol during bolus administration of intravenous contrast. Multiplanar CT image reconstructions and MIPs were obtained to evaluate the vascular anatomy. CONTRAST:  67mL OMNIPAQUE IOHEXOL 350  MG/ML SOLN COMPARISON:  None. FINDINGS: Cardiovascular: A moderate amount of intraluminal low attenuation is seen within anterior upper lobe branches of the bilateral pulmonary arteries (axial CT images 30 through 33 and 40 through 42, CT series 4) and multiple lower lobe branches of the bilateral pulmonary arteries. There is mild to moderate severity cardiomegaly. No associated right heart strain or saddle embolus is identified. No pericardial effusion. Mediastinum/Nodes: No enlarged mediastinal, hilar, or axillary lymph nodes. Thyroid gland, trachea, and esophagus demonstrate no significant findings. Lungs/Pleura: Very mild areas of atelectasis are seen within the bilateral lung bases. There is a small right pleural effusion. No pneumothorax is identified. Upper Abdomen: A 1.3 cm diameter cyst is seen within the posterior aspect of the mid right kidney.  Musculoskeletal: No chest wall abnormality. No acute or significant osseous findings. Review of the MIP images confirms the above findings. IMPRESSION: 1. Moderate amount of bilateral pulmonary embolism. 2. Small right pleural effusion. 3. Moderate severity cardiomegaly. Electronically Signed   By: Aram Candela M.D.   On: 12/16/2020 00:29   CT ABDOMEN PELVIS W CONTRAST  Result Date: 12/16/2020 CLINICAL DATA:  Abdominal distension and shortness of breath. EXAM: CT ABDOMEN AND PELVIS WITH CONTRAST TECHNIQUE: Multidetector CT imaging of the abdomen and pelvis was performed using the standard protocol following bolus administration of intravenous contrast. CONTRAST:  30mL OMNIPAQUE IOHEXOL 350 MG/ML SOLN COMPARISON:  August 17, 2019 FINDINGS: Lower chest: Intraluminal filling defects are seen involving multiple lower lobe branches of the bilateral pulmonary arteries. There is a small right pleural effusion. Mild posterolateral right basilar atelectasis is noted. Moderate severity cardiomegaly is noted. Hepatobiliary: No focal liver abnormality is seen. No gallstones, gallbladder wall thickening, or biliary dilatation. Pancreas: Unremarkable. No pancreatic ductal dilatation or surrounding inflammatory changes. Spleen: Normal in size without focal abnormality. Adrenals/Urinary Tract: Adrenal glands are unremarkable. Kidneys are normal in size, without renal calculi or hydronephrosis. A 1.4 cm diameter simple cyst is seen within the posterior aspect of the mid right kidney. Mass effect is seen involving the floor the urinary bladder by a markedly enlarged prostate gland. Stomach/Bowel: Stomach is within normal limits. Appendix appears normal. No evidence of bowel wall thickening, distention, or inflammatory changes. Noninflamed diverticula are seen throughout the large bowel. Vascular/Lymphatic: Aortic atherosclerosis. No enlarged abdominal or pelvic lymph nodes. Reproductive: The prostate gland is markedly enlarged  with extension into the base of the urinary bladder. This is increased in size when compared to the prior exam. Other: No abdominal wall hernia or abnormality. A small amount of posterior pelvic free fluid is seen. Musculoskeletal: No acute or significant osseous findings. IMPRESSION: 1. Multiple bilateral lower lobe pulmonary emboli. 2. Small right pleural effusion. 3. Moderate severity cardiomegaly. 4. Colonic diverticulosis. 5. Markedly enlarged prostate gland. 6. Small amount of posterior pelvic free fluid. Aortic Atherosclerosis (ICD10-I70.0). Electronically Signed   By: Aram Candela M.D.   On: 12/16/2020 00:34    EKG: Independently reviewed. Sinus tachycardia (rate 115), PVCs.   Assessment/Plan   1. Pulmonary embolism  - Presents with 3 months of SOB and chest discomfort and is fount to have bilateral lower lobe PE without evidence for right heart strain on CT  - IV heparin started in ED  - BNP and troponin elevated but unlikely to be resulting from PE  - Check LE venous US, continue IV heparin for now while working up new CHF and transition to oral AC if coronary angiography not pursued   2. New CHF  - Presents  with ~3 months of SOB and chest discomfort and found to have BNP 2025 with cardiomegaly and small pleural effusion on CT  - Given IV Lasix in ED  - Check echocardiogram and TSH, continue cardiac monitoring, continue Lasix    3. Elevated LFTs  - AST 126, ALT 202, and t bili 4.2 in ED (transaminases slightly elevated and bili normal in June 2021)  - INR is 1.6  - No focal liver abnormality or biliary dilatation on CT in ED  - Possibly congestive hepatopathy, check viral hepatitis panel, trend LFTs    4. Renal insufficiency  - SCr is 1.82 on admission, was 2.08 in June 2021  - He has severe prostate enlargement but no hydronephrosis on CT in ED  - Likely CKD III  - Renally-dose medications, monitor closely while starting diuretics   5. Elevated troponin - HS troponin 43  then 47, not consistent with ACS but likely related to CHF and/or PE  - Follow-up echo results     DVT prophylaxis: IV heparin  Code Status: Full  Level of Care: Level of care: Telemetry Family Communication: none present  Disposition Plan:  Patient is from: Home  Anticipated d/c is to: TBD Anticipated d/c date is: 12/18/20 Patient currently: pending echocardiogram, LE venous dopplers, transition to oral anticoagulation, possible cardiology consultation after echo  Consults called: none  Admission status: Inpatient     Briscoe Deutscher, MD [facility]  12/16/2020, 4:57 AM

## 2020-12-16 NOTE — Progress Notes (Signed)
ANTICOAGULATION CONSULT NOTE - Initial Consult  Pharmacy Consult for heparin Indication: pulmonary embolus  No Known Allergies  Patient Measurements: Height: 6' (182.9 cm) Weight: 92.1 kg (203 lb) IBW/kg (Calculated) : 77.6 Heparin Dosing Weight: 92.1kg  Vital Signs: Temp: 97.7 F (36.5 C) (10/05 1650) Temp Source: Oral (10/05 1650) BP: 109/66 (10/05 2300) Pulse Rate: 111 (10/05 2300)  Labs: Recent Labs    12/15/20 1716 12/15/20 2256  HGB 15.5  --   HCT 45.1  --   PLT 168  --   CREATININE 1.82*  --   TROPONINIHS 43* 47*    Estimated Creatinine Clearance: 52.7 mL/min (A) (by C-G formula based on SCr of 1.82 mg/dL (H)).   Medical History: History reviewed. No pertinent past medical history.   Assessment: 50 yo male complains of SOB, reports associated N/V.   Pharmacy consulted to dose heparin drip for PE.  No prior AC noted.  CBC WNL, Scr 1.82 Baseline labs ordered STAT   Goal of Therapy:  Heparin level 0.3-0.7 units/ml Monitor platelets by anticoagulation protocol: Yes   Plan:  Heparin bolus 4000 units x 1 Start heparin drip at 1500 units/hr Heparin level in 6 hours Daily CBC   Arley Phenix RPh 12/16/2020, 12:39 AM

## 2020-12-17 ENCOUNTER — Inpatient Hospital Stay (HOSPITAL_COMMUNITY): Payer: Medicaid Other

## 2020-12-17 ENCOUNTER — Inpatient Hospital Stay (HOSPITAL_COMMUNITY)
Admission: EM | Disposition: A | Discharge status: Home Health | Payer: Self-pay | Source: Home / Self Care | Attending: Internal Medicine

## 2020-12-17 DIAGNOSIS — R570 Cardiogenic shock: Secondary | ICD-10-CM

## 2020-12-17 DIAGNOSIS — I2699 Other pulmonary embolism without acute cor pulmonale: Secondary | ICD-10-CM

## 2020-12-17 DIAGNOSIS — R079 Chest pain, unspecified: Secondary | ICD-10-CM

## 2020-12-17 DIAGNOSIS — I5021 Acute systolic (congestive) heart failure: Secondary | ICD-10-CM

## 2020-12-17 DIAGNOSIS — R0602 Shortness of breath: Secondary | ICD-10-CM

## 2020-12-17 DIAGNOSIS — I5082 Biventricular heart failure: Secondary | ICD-10-CM

## 2020-12-17 HISTORY — PX: RIGHT HEART CATH: CATH118263

## 2020-12-17 HISTORY — PX: ARTERIAL LINE INSERTION: CATH118227

## 2020-12-17 LAB — COMPREHENSIVE METABOLIC PANEL
ALT: 1248 U/L — ABNORMAL HIGH (ref 0–44)
AST: 1377 U/L — ABNORMAL HIGH (ref 15–41)
Albumin: 3.6 g/dL (ref 3.5–5.0)
Alkaline Phosphatase: 37 U/L — ABNORMAL LOW (ref 38–126)
Anion gap: 16 — ABNORMAL HIGH (ref 5–15)
BUN: 49 mg/dL — ABNORMAL HIGH (ref 6–20)
CO2: 18 mmol/L — ABNORMAL LOW (ref 22–32)
Calcium: 8.7 mg/dL — ABNORMAL LOW (ref 8.9–10.3)
Chloride: 100 mmol/L (ref 98–111)
Creatinine, Ser: 2.66 mg/dL — ABNORMAL HIGH (ref 0.61–1.24)
GFR, Estimated: 28 mL/min — ABNORMAL LOW (ref >60)
Glucose, Bld: 151 mg/dL — ABNORMAL HIGH (ref 70–99)
Potassium: 4.7 mmol/L (ref 3.5–5.1)
Sodium: 134 mmol/L — ABNORMAL LOW (ref 135–145)
Total Bilirubin: 4.7 mg/dL — ABNORMAL HIGH (ref 0.3–1.2)
Total Protein: 6 g/dL — ABNORMAL LOW (ref 6.5–8.1)

## 2020-12-17 LAB — POCT I-STAT EG7
Acid-base deficit: 2 mmol/L (ref 0.0–2.0)
Acid-base deficit: 4 mmol/L — ABNORMAL HIGH (ref 0.0–2.0)
Bicarbonate: 22.7 mmol/L (ref 20.0–28.0)
Bicarbonate: 21.1 mmol/L (ref 20.0–28.0)
Calcium, Ion: 1.11 mmol/L — ABNORMAL LOW (ref 1.15–1.40)
Calcium, Ion: 0.98 mmol/L — ABNORMAL LOW (ref 1.15–1.40)
HCT: 41 % (ref 39.0–52.0)
HCT: 38 % — ABNORMAL LOW (ref 39.0–52.0)
Hemoglobin: 13.9 g/dL (ref 13.0–17.0)
Hemoglobin: 12.9 g/dL — ABNORMAL LOW (ref 13.0–17.0)
O2 Saturation: 69 %
O2 Saturation: 63 %
Potassium: 4 mmol/L (ref 3.5–5.1)
Potassium: 3.6 mmol/L (ref 3.5–5.1)
Sodium: 135 mmol/L (ref 135–145)
Sodium: 139 mmol/L (ref 135–145)
TCO2: 24 mmol/L (ref 22–32)
TCO2: 22 mmol/L (ref 22–32)
pCO2, Ven: 39.6 mmHg — ABNORMAL LOW (ref 44.0–60.0)
pCO2, Ven: 38.2 mmHg — ABNORMAL LOW (ref 44.0–60.0)
pH, Ven: 7.365 (ref 7.250–7.430)
pH, Ven: 7.351 (ref 7.250–7.430)
pO2, Ven: 37 mmHg (ref 32.0–45.0)
pO2, Ven: 34 mmHg (ref 32.0–45.0)

## 2020-12-17 LAB — PROCALCITONIN: Procalcitonin: 1.22 ng/mL

## 2020-12-17 LAB — TROPONIN I (HIGH SENSITIVITY)
Troponin I (High Sensitivity): 35 ng/L — ABNORMAL HIGH (ref <18)
Troponin I (High Sensitivity): 40 ng/L — ABNORMAL HIGH (ref <18)

## 2020-12-17 LAB — COOXEMETRY PANEL
Carboxyhemoglobin: 0.8 % (ref 0.5–1.5)
Carboxyhemoglobin: 1 % (ref 0.5–1.5)
Methemoglobin: 0.7 % (ref 0.0–1.5)
Methemoglobin: 0.9 % (ref 0.0–1.5)
O2 Saturation: 59.7 %
O2 Saturation: 63.3 %
Total hemoglobin: 13.8 g/dL (ref 12.0–16.0)
Total hemoglobin: 13.8 g/dL (ref 12.0–16.0)

## 2020-12-17 LAB — MRSA NEXT GEN BY PCR, NASAL: MRSA by PCR Next Gen: NOT DETECTED

## 2020-12-17 LAB — PROTIME-INR
INR: 2 — ABNORMAL HIGH (ref 0.8–1.2)
Prothrombin Time: 23 seconds — ABNORMAL HIGH (ref 11.4–15.2)

## 2020-12-17 LAB — LACTIC ACID, PLASMA
Lactic Acid, Venous: 2.7 mmol/L (ref 0.5–1.9)
Lactic Acid, Venous: 1.8 mmol/L (ref 0.5–1.9)

## 2020-12-17 LAB — URINALYSIS, ROUTINE W REFLEX MICROSCOPIC
Bilirubin Urine: NEGATIVE
Glucose, UA: NEGATIVE mg/dL
Hgb urine dipstick: NEGATIVE
Ketones, ur: NEGATIVE mg/dL
Leukocytes,Ua: NEGATIVE
Nitrite: NEGATIVE
Protein, ur: NEGATIVE mg/dL
Specific Gravity, Urine: 1.01 (ref 1.005–1.030)
pH: 5 (ref 5.0–8.0)

## 2020-12-17 LAB — CBC
HCT: 41.8 % (ref 39.0–52.0)
Hemoglobin: 14.5 g/dL (ref 13.0–17.0)
MCH: 28.9 pg (ref 26.0–34.0)
MCHC: 34.7 g/dL (ref 30.0–36.0)
MCV: 83.4 fL (ref 80.0–100.0)
Platelets: 165 10*3/uL (ref 150–400)
RBC: 5.01 MIL/uL (ref 4.22–5.81)
RDW: 14.5 % (ref 11.5–15.5)
WBC: 11.1 10*3/uL — ABNORMAL HIGH (ref 4.0–10.5)
nRBC: 0.8 % — ABNORMAL HIGH (ref 0.0–0.2)

## 2020-12-17 LAB — ECHOCARDIOGRAM COMPLETE
AR max vel: 3.17 cm2
AV Area VTI: 2.94 cm2
AV Area mean vel: 2.69 cm2
AV Mean grad: 1 mmHg
AV Peak grad: 1.7 mmHg
Ao pk vel: 0.66 m/s
Area-P 1/2: 7.66 cm2
Height: 72 in
MV M vel: 3.79 m/s
MV Peak grad: 57.3 mmHg
S' Lateral: 6.9 cm
Single Plane A4C EF: 21.4 %
Weight: 3248 oz

## 2020-12-17 LAB — CK TOTAL AND CKMB (NOT AT ARMC)
CK, MB: 3.2 ng/mL (ref 0.5–5.0)
Relative Index: 2.1 (ref 0.0–2.5)
Total CK: 149 U/L (ref 49–397)

## 2020-12-17 LAB — MAGNESIUM: Magnesium: 2.3 mg/dL (ref 1.7–2.4)

## 2020-12-17 LAB — HEPARIN LEVEL (UNFRACTIONATED): Heparin Unfractionated: 0.39 IU/mL (ref 0.30–0.70)

## 2020-12-17 LAB — BRAIN NATRIURETIC PEPTIDE: B Natriuretic Peptide: 1742.2 pg/mL — ABNORMAL HIGH (ref 0.0–100.0)

## 2020-12-17 SURGERY — RIGHT HEART CATH
Anesthesia: LOCAL

## 2020-12-17 MED ORDER — SODIUM CHLORIDE 0.9 % IV SOLN
INTRAVENOUS | Status: AC | PRN
  Administered 2020-12-17: 10 mL/h via INTRAVENOUS

## 2020-12-17 MED ORDER — NOREPINEPHRINE 4 MG/250ML-% IV SOLN
2.0000 ug/min | INTRAVENOUS | Status: DC
Start: 2020-12-17 — End: 2020-12-17
  Filled 2020-12-17: qty 250

## 2020-12-17 MED ORDER — MIDAZOLAM HCL 2 MG/2ML IJ SOLN
INTRAMUSCULAR | Status: DC | PRN
  Administered 2020-12-17: 1 mg via INTRAVENOUS

## 2020-12-17 MED ORDER — LIDOCAINE HCL (PF) 1 % IJ SOLN
INTRAMUSCULAR | Status: AC
  Filled 2020-12-17: qty 30

## 2020-12-17 MED ORDER — MILRINONE LACTATE IN DEXTROSE 20-5 MG/100ML-% IV SOLN
0.5000 ug/kg/min | INTRAVENOUS | Status: DC
  Administered 2020-12-17: 0.125 ug/kg/min via INTRAVENOUS
  Administered 2020-12-18: 0.25 ug/kg/min via INTRAVENOUS
  Administered 2020-12-18 – 2020-12-20 (×4): 0.375 ug/kg/min via INTRAVENOUS
  Administered 2020-12-21: .5 ug/kg/min via INTRAVENOUS
  Administered 2020-12-21 – 2020-12-23 (×9): 0.5 ug/kg/min via INTRAVENOUS
  Administered 2020-12-24: .5 ug/kg/min via INTRAVENOUS
  Administered 2020-12-24 – 2021-01-05 (×39): 0.5 ug/kg/min via INTRAVENOUS
  Filled 2020-12-17 (×2): qty 100
  Filled 2020-12-17: qty 200
  Filled 2020-12-17 (×57): qty 100

## 2020-12-17 MED ORDER — HEPARIN (PORCINE) IN NACL 1000-0.9 UT/500ML-% IV SOLN
INTRAVENOUS | Status: AC
  Filled 2020-12-17: qty 500

## 2020-12-17 MED ORDER — LIDOCAINE HCL (PF) 1 % IJ SOLN
INTRAMUSCULAR | Status: DC | PRN
  Administered 2020-12-17: 4 mL via SUBCUTANEOUS

## 2020-12-17 MED ORDER — FUROSEMIDE 10 MG/ML IJ SOLN
40.0000 mg | Freq: Two times a day (BID) | INTRAMUSCULAR | Status: DC
  Administered 2020-12-17 – 2020-12-19 (×5): 40 mg via INTRAVENOUS
  Filled 2020-12-17 (×5): qty 4

## 2020-12-17 MED ORDER — MIDAZOLAM HCL 2 MG/2ML IJ SOLN
INTRAMUSCULAR | Status: AC
  Filled 2020-12-17: qty 2

## 2020-12-17 MED ORDER — FENTANYL CITRATE PF 50 MCG/ML IJ SOSY
25.0000 ug | PREFILLED_SYRINGE | INTRAMUSCULAR | Status: DC | PRN
  Administered 2020-12-21: 25 ug via INTRAVENOUS
  Filled 2020-12-17 (×3): qty 1

## 2020-12-17 MED ORDER — CHLORHEXIDINE GLUCONATE CLOTH 2 % EX PADS
6.0000 | MEDICATED_PAD | Freq: Every day | CUTANEOUS | Status: DC
  Administered 2020-12-17 – 2021-01-05 (×17): 6 via TOPICAL

## 2020-12-17 MED ORDER — FENTANYL CITRATE (PF) 100 MCG/2ML IJ SOLN
25.0000 ug | INTRAMUSCULAR | Status: DC | PRN
Start: 2020-12-17 — End: 2020-12-17

## 2020-12-17 MED ORDER — TRAMADOL HCL 50 MG PO TABS
100.0000 mg | ORAL_TABLET | Freq: Four times a day (QID) | ORAL | Status: DC | PRN
  Administered 2020-12-17 – 2020-12-21 (×11): 100 mg via ORAL
  Filled 2020-12-17 (×11): qty 2

## 2020-12-17 MED ORDER — NOREPINEPHRINE 4 MG/250ML-% IV SOLN
0.0000 ug/min | INTRAVENOUS | Status: DC
  Administered 2020-12-17: 2 ug/min via INTRAVENOUS
  Administered 2020-12-18: 7 ug/min via INTRAVENOUS
  Administered 2020-12-18: 5 ug/min via INTRAVENOUS
  Administered 2020-12-19 (×2): 9 ug/min via INTRAVENOUS
  Administered 2020-12-20 – 2020-12-21 (×5): 15 ug/min via INTRAVENOUS
  Administered 2020-12-21: 21 ug/min via INTRAVENOUS
  Administered 2020-12-21 (×2): 15 ug/min via INTRAVENOUS
  Administered 2020-12-22: 14 ug/min via INTRAVENOUS
  Administered 2020-12-22: 16 ug/min via INTRAVENOUS
  Filled 2020-12-17 (×16): qty 250

## 2020-12-17 SURGICAL SUPPLY — 8 items
CATH SWAN GANZ VIP 7.5F (CATHETERS) ×1 IMPLANT
KIT ARTERIAL CATH 20G 4.45CM (CATHETERS) ×1 IMPLANT
MAT PREVALON FULL STRYKER (MISCELLANEOUS) ×1 IMPLANT
PACK CARDIAC CATHETERIZATION (CUSTOM PROCEDURE TRAY) ×2 IMPLANT
SHEATH PINNACLE 8F 10CM (SHEATH) ×1 IMPLANT
SHEATH PROBE COVER 6X72 (BAG) ×1 IMPLANT
SLEEVE REPOSITIONING LENGTH 30 (MISCELLANEOUS) ×1 IMPLANT
TRANSDUCER W/STOPCOCK (MISCELLANEOUS) ×2 IMPLANT

## 2020-12-17 NOTE — Progress Notes (Signed)
PROGRESS NOTE    Dylan Chaney  EVO:[id number] DOB: 1970-09-13 DOA: 12/15/2020 PCP: Lavinia Sharps, NP    Brief Narrative:  Dylan Chaney was admitted to he hospital with the working diagnosis of acute pulmonary embolism now complicated with shock.   50 year old male with a past medical history for prostate enlargement and chronic kidney disease along with remote history of drug and alcohol abuse who presented with chest pain, abdominal pain and dyspnea.  Ongoing symptoms for about 3 months, but worse over the last few days, associated with palpitations.  On his initial physical examination blood pressure 108/82, 93/74, heart rate 113, respiratory rate 28, oxygen saturation 93%, his lungs were clear to auscultation bilaterally, heart S1-S2, present, rhythmic, soft abdomen, positive lower extremity edema.  Sodium 140, potassium 4.7, chloride 107, bicarb 21, glucose 176, BUN 20, creatinine 1.82, AST 126, ALT 202, BNP 2024, high sensitive troponin 43-47, white count 7.8, hemoglobin 15.5, hematocrit 45.1, platelets 168. SARS COVID-19 negative.  Chest radiograph with cardiomegaly, no infiltrates.  His chest CT is positive for moderate amount of bilateral pulm embolism.  Moderate to severe cardiomegaly no signs of right heart strain or saddle embolism.  No pericardial effusion.  EKG 115 bpm, normal axis, normal intervals, sinus rhythm, Q-wave V1-V4, positive PVCs, no significant ST segment or T wave changes.  Patient was placed on IV heparin for anticoagulation. Persistent hypotension, refractive to iV fluids.   Worsening hepatic cellular injury, worsening renal failure.  [place] lower extremities with bilateral DVT.   Assessment & Plan:   Principal Problem:   CHF (congestive heart failure) (HCC) Active Problems:   Pulmonary embolism (HCC)   Elevated LFTs   Elevated troponin   Chronic renal insufficiency   Acute pulmonary embolism, bilateral lower extremity DVT, complicated with shock.  Acute systolic heart failure.  Patient with blood pressure this am systolic in the 80's. MAP is more than 65 mmHg, but has sings of hypoperfusion with worsening hepatocellular injury, worsening renal function.   Plan: Follow up on echocardiogram. Patient will likely need vasopressors.  Close follow up renal function and liver function.  Continue anticoagulation with IV heparin.    Will consult critical care.   2. AKI on CKD stage 3a, positive anion gap metabolic acidosis. Patient with worsening renal function his serum cr is up 2,66, K is 4,7 and serum bicarbonate 18, anion gap 16.  Plan continue hemodynamic support, may need vasopressors, patient refractive to IV fluids.   3.  Acute liver failure, hepatocellular injury.  Worsening liver enzymes elevation.  AST 1,377, ALT 1,248, INR 2.0.  Not encephalopathic.   Plan to transfer to higher level of care. Possible liver shock. Further work up with liver US.     Patient continue to be at high risk for worsening shock. Patient with imminent risk for deterioration, critical care time 60 minutes.   Status is: Inpatient  Remains inpatient appropriate because:Inpatient level of care appropriate due to severity of illness  Dispo: The patient is from: Home              Anticipated d/c is to: Home              Patient currently is not medically stable to d/c.   Difficult to place patient No   DVT prophylaxis: Heparin   Code Status:   Full code   Family Communication:  No family at the bedside      Consultants:  PCCM  Subjective: Patient having headache this morning, persistent  chest pain and dyspnea. Positive nausea but not vomiting.   Objective: Vitals:   12/16/20 1732 12/16/20 2100 12/16/20 2300 12/17/20 0500  BP: 101/81 (!) 87/62 (!) 87/74 (!) 88/75  Pulse: 100 (!) 103 (!) 103 98  Resp: 20 20  19   Temp: (!) 97.5 F (36.4 C) (!) 97.5 F (36.4 C) 98 F (36.7 C) 98.7 F (37.1 C)  TempSrc: Oral Oral Axillary Axillary   SpO2: 98%  97%   Weight:      Height:        Intake/Output Summary (Last 24 hours) at 12/17/2020 0908 Last data filed at 12/17/2020 0600 Gross per 24 hour  Intake 1020.06 ml  Output 200 ml  Net 820.06 ml   Filed Weights   12/15/20 1653  Weight: 92.1 kg    Examination:   General: deconditioned and ill looking appearing  Neurology: Awake and alert, non focal  E ENT: mild  pallor, no icterus, oral mucosa moist Cardiovascular: positive JVD. S1-S2 present, rhythmic, no gallops, rubs, or murmurs. No lower extremity edema. Cold lower and upper extremities.  Pulmonary:  positive breath sounds bilaterally, no wheezing, rhonchi or rales. Gastrointestinal. Abdomen  soft, mild tender to palpation.  Skin. No rashes Musculoskeletal: no joint deformities     Data Reviewed: I have personally reviewed following labs and imaging studies  CBC: Recent Labs  Lab 12/15/20 1716 12/16/20 0634 12/17/20 0457  WBC 7.8 9.2 11.1*  NEUTROABS 5.2  --   --   HGB 15.5 15.0 14.5  HCT 45.1 43.8 41.8  MCV 85.6 84.4 83.4  PLT 168 163 165   Basic Metabolic Panel: Recent Labs  Lab 12/15/20 1716 12/16/20 0634 12/17/20 0457  NA 140 135 134*  K 4.7 4.7 4.7  CL 107 104 100  CO2 21* 19* 18*  GLUCOSE 176* 161* 151*  BUN 20 32* 49*  CREATININE 1.82* 1.83* 2.66*  CALCIUM 9.5 8.9 8.7*  MG  --  2.2 2.3   GFR: Estimated Creatinine Clearance: 36.1 mL/min (A) (by C-G formula based on SCr of 2.66 mg/dL (H)). Liver Function Tests: Recent Labs  Lab 12/15/20 1716 12/16/20 0634 12/17/20 0457  AST 126* 404* 1,377*  ALT 202* 443* 1,248*  ALKPHOS 38 36* 37*  BILITOT 4.2* 4.0* 4.7*  PROT 6.3* 6.0* 6.0*  ALBUMIN 3.6 3.7 3.6   Recent Labs  Lab 12/15/20 1716  LIPASE 73*   No results for input(s): AMMONIA in the last 168 hours. Coagulation Profile: Recent Labs  Lab 12/16/20 0040 12/17/20 0457  INR 1.6* 2.0*   Cardiac Enzymes: No results for input(s): CKTOTAL, CKMB, CKMBINDEX, TROPONINI in  the last 168 hours. BNP (last 3 results) No results for input(s): PROBNP in the last 8760 hours. HbA1C: No results for input(s): HGBA1C in the last 72 hours. CBG: No results for input(s): GLUCAP in the last 168 hours. Lipid Profile: No results for input(s): CHOL, HDL, LDLCALC, TRIG, CHOLHDL, LDLDIRECT in the last 72 hours. Thyroid Function Tests: Recent Labs    12/16/20 0827  TSH 2.489   Anemia Panel: No results for input(s): VITAMINB12, FOLATE, FERRITIN, TIBC, IRON, RETICCTPCT in the last 72 hours.    Radiology Studies: I have reviewed all of the imaging during this hospital visit personally     Scheduled Meds:  pantoprazole  40 mg Oral Daily   tamsulosin  0.8 mg Oral QHS   Continuous Infusions:  heparin 1,500 Units/hr (12/16/20 1058)     LOS: 1 day  Natthew Marlatt Gerome Apley, MD

## 2020-12-17 NOTE — Consult Note (Signed)
NAME:  Dylan Chaney, MRN:  [id number], DOB:  Sep 13, 1970, LOS: 1 ADMISSION DATE:  12/15/2020, CONSULTATION DATE:  12/17/2020 REFERRING MD:  Dr. Cathlean Sauer, CHIEF COMPLAINT:  Shock    History of Present Illness:  Dylan Chaney is a 50 y.o. male with a past medical history significant for prostate enlargement, chronic kidney disease, and remote history of alcohol and substance abuse who presented to the emergency room for complaints of chest and back pain with associated shortness of breath.  Patient states he started feeling unwell approximately 8 to 12 weeks ago with major symptom being chest discomfort and abdominal pain with associated nausea and vomiting.  When the chest pain and shortness of breath began and patient decided to present to the emergency department.  On arrival to ED patient was seen hypotensive, tachycardic, tachypneic, and borderline hypoxic.  Given concern for cardiogenic component and/or pulmonary embolism patient underwent CT of chest and abdomen which revealed moderate bilateral pulmonary embolism with severe cardiomegaly.  Patient was admitted per hospitalist team.  By morning but 10/7 patient was seen with worsening shock signs with progressive hypotension, tachycardia, and hypothermia.  L lab work also indicated multisystem organ dysfunction including worsening AKI, and worsening transaminitis indicating shock liver.  PCCM consulted for further management and transfer to ICU  Pertinent  Medical History  Enlarged prostate CKD Remote history of alcohol and substance abuse  Significant Hospital Events: Including procedures, antibiotic start and stop dates in addition to other pertinent events   10/6 admitted for bilateral PE and likely new onset congestive heart failure 10/7 worsening shock state resulting in PCCM consulted and transferred to ICU  Interim History / Subjective:  Seen lying in bed weak with reported continued chest and back pain with mild dyspnea  Objective    Blood pressure (!) 88/75, pulse 98, temperature 98.7 F (37.1 C), temperature source Axillary, resp. rate 19, height 6' (1.829 m), weight 92.1 kg, SpO2 97 %.        Intake/Output Summary (Last 24 hours) at 12/17/2020 1005 Last data filed at 12/17/2020 0600 Gross per 24 hour  Intake 1020.06 ml  Output 200 ml  Net 820.06 ml   Filed Weights   12/15/20 1653  Weight: 92.1 kg    Examination: General: Acute ill-appearing middle-aged gentleman lying in bed in mild distress HEENT: [place]/AT, MM pink/moist, PERRL,  Neuro: Oriented x3, nonfocal, frail-appearing CV: s1s2 regular rate and rhythm, no murmur, rubs, or gallops,  PULM: Tachypnea with pulmonary splinting seen Chaney to pain, diminished air entry bilaterally, no accessory muscle use GI: soft, bowel sounds active in all 4 quadrants, non-tender, non-distended Extremities: warm/dry, nonpitting generalized edema  Skin: no rashes or lesions  Resolved Hospital Problem list     Assessment & Plan:  Undifferentiated shock  -High suspicion for cardiogenic shock given hypotension, tachycardia, anion gap metabolic acidosis, cool skin with depressed EF on POC ultrasound. Formal ECHO pending  -Can not rule out hypovolemic vs septic shock at this time  P: Transfer to  ICU Supplemental oxygen as need for hypoxia  Pan cultures  Check procalcitonin  Aggressive IV hydration MAP goal > 65  Obtain lactic acid Monitor urine output  New onset congestive heart failure  -POC ultrasound in ED with estimated EF 20% P: Continuous telemetry  Obtain/monitor BNP Strict intake and output  Daily weight to assess volume status Closely monitor renal function and electrolytes  Ensure hemodynamic control, may need pressor support  Assessment of possible ischemic disease and valvular disease with 12  lead EKG  Acute bilateral PE -CTA chest with moderate amount of bilateral pulmonary embolism, small right pleural effusion and moderate severity  cardiomegaly. -PESI score 101 Bilateral lower extremityy DVT -Doppler positive for DVT right posterior tibial and DVT left peroneal veins. P: Given location of clot mechanical embolectomy not viable  If shock state were to worsen could consider systemic tPA May need to consider IVC filter Continue systemic heparin drip  Stat ECHO Bedrest  No SCD   Acute Kidney Injury superimposed on CKD stage 3a -in the setting of shock as above, Creatinine on admit 1.82 since uptrended to 2.66 with minimal urine output  Acute anion gap metabolic acidosis  P: Consult nephrology  Follow renal function  Monitor urine output Trend Bmet Avoid nephrotoxins' Ensure adequate renal perfusion  Consider Bicarb supplementation vs correction of lactic acidosis if present   Shock liver -LFT elevated on admit with significant increase 10/7 AST 1,377, ALT 1,248, Alk phos 37, total billirubin 4.7 Coagulopathy  -INR 2.0, PTT 23.0 P: Treat underlying cause as above  Avoid hepatotoxins   Hyponatremia  P: Trend Bmet  IV hydration    Best Practice   Diet/type: NPO DVT prophylaxis: systemic heparin GI prophylaxis: PPI Lines: N/A Foley:  N/A Code Status:  full code Last date of multidisciplinary goals of care discussion: Pending   Labs   CBC: Recent Labs  Lab 12/15/20 1716 12/16/20 0634 12/17/20 0457  WBC 7.8 9.2 11.1*  NEUTROABS 5.2  --   --   HGB 15.5 15.0 14.5  HCT 45.1 43.8 41.8  MCV 85.6 84.4 83.4  PLT 168 163 585    Basic Metabolic Panel: Recent Labs  Lab 12/15/20 1716 12/16/20 0634 12/17/20 0457  NA 140 135 134*  K 4.7 4.7 4.7  CL 107 104 100  CO2 21* 19* 18*  GLUCOSE 176* 161* 151*  BUN 20 32* 49*  CREATININE 1.82* 1.83* 2.66*  CALCIUM 9.5 8.9 8.7*  MG  --  2.2 2.3   GFR: Estimated Creatinine Clearance: 36.1 mL/min (A) (by C-G formula based on SCr of 2.66 mg/dL (H)). Recent Labs  Lab 12/15/20 1716 12/16/20 0634 12/17/20 0457  WBC 7.8 9.2 11.1*    Liver  Function Tests: Recent Labs  Lab 12/15/20 1716 12/16/20 0634 12/17/20 0457  AST 126* 404* 1,377*  ALT 202* 443* 1,248*  ALKPHOS 38 36* 37*  BILITOT 4.2* 4.0* 4.7*  PROT 6.3* 6.0* 6.0*  ALBUMIN 3.6 3.7 3.6   Recent Labs  Lab 12/15/20 1716  LIPASE 73*   No results for input(s): AMMONIA in the last 168 hours.  ABG No results found for: PHART, PCO2ART, PO2ART, HCO3, TCO2, ACIDBASEDEF, O2SAT   Coagulation Profile: Recent Labs  Lab 12/16/20 0040 12/17/20 0457  INR 1.6* 2.0*    Cardiac Enzymes: No results for input(s): CKTOTAL, CKMB, CKMBINDEX, TROPONINI in the last 168 hours.  HbA1C: No results found for: HGBA1C  CBG: No results for input(s): GLUCAP in the last 168 hours.  Review of Systems:   Please see the history of present illness. All other systems reviewed and are negative   Past Medical History:  He,  has a past medical history of Prostate enlargement and Substance abuse ([place]).   Surgical History:  History reviewed. No pertinent surgical history.   Social History:   reports that he has quit smoking. His smoking use included cigarettes. He has never used smokeless tobacco. He reports that he does not currently use alcohol. He reports that he does not  currently use drugs after having used the following drugs: Cocaine and Marijuana.   Family History:  His family history is negative for Sudden Cardiac Death.   Allergies No Known Allergies   Home Medications  Prior to Admission medications   Medication Sig Start Date End Date Taking? Authorizing Provider  omeprazole (PRILOSEC) 20 MG capsule Take 20 mg by mouth daily. 12/06/20  Yes [provider]  promethazine (PHENERGAN) 25 MG tablet Take 25 mg by mouth 4 (four) times daily as needed. 12/06/20  Yes [provider]  tamsulosin (FLOMAX) 0.4 MG CAPS capsule Take 0.8 mg by mouth at bedtime. 11/23/20  Yes [provider]     Critical care time:   CRITICAL CARE Performed by: Charlcie Prisco  D. Harris   Total critical care time: 50 minutes  Critical care time was exclusive of separately billable procedures and treating other patients.  Critical care was necessary to treat or prevent imminent or life-threatening deterioration.  Critical care was time spent personally by me on the following activities: development of treatment plan with patient and/or surrogate as well as nursing, discussions with consultants, evaluation of patient's response to treatment, examination of patient, obtaining history from patient or surrogate, ordering and performing treatments and interventions, ordering and review of laboratory studies, ordering and review of radiographic studies, pulse oximetry and re-evaluation of patient's condition.  Oliviarose Punch D. Kenton Kingfisher, NP-C  Pulmonary & Critical Care Personal contact information can be found on Amion  12/17/2020, 11:00 AM

## 2020-12-17 NOTE — Progress Notes (Signed)
Attempted 2D echocardiogram, however patient is very nauseated at this time. Will attempt again when patient is feeling better, as schedule permits.  12/17/2020 9:09 AM Eula Fried., MHA, RVT, RDCS, RDMS

## 2020-12-17 NOTE — Progress Notes (Signed)
ANTICOAGULATION CONSULT NOTE - Follow Up Consult  Pharmacy Consult for Heparin Indication: pulmonary embolus  No Known Allergies  Patient Measurements: Height: 6' (182.9 cm) Weight: 92.1 kg (203 lb) IBW/kg (Calculated) : 77.6 Heparin Dosing Weight: 92.1 kg  Vital Signs: Temp: 98 F (36.7 C) (10/06 2300) Temp Source: Axillary (10/06 2300) BP: 87/74 (10/06 2300) Pulse Rate: 103 (10/06 2300)  Labs: Recent Labs    12/15/20 1716 12/15/20 2256 12/16/20 0040 12/16/20 0634 12/16/20 0827 12/16/20 1500 12/17/20 0457  HGB 15.5  --   --  15.0  --   --  14.5  HCT 45.1  --   --  43.8  --   --  41.8  PLT 168  --   --  163  --   --  165  APTT  --   --  30  --   --   --   --   LABPROT  --   --  18.6*  --   --   --  23.0*  INR  --   --  1.6*  --   --   --  2.0*  HEPARINUNFRC  --   --   --   --  0.50 0.48 0.39  CREATININE 1.82*  --   --  1.83*  --   --  2.66*  TROPONINIHS 43* 47*  --   --   --   --   --      Estimated Creatinine Clearance: 36.1 mL/min (A) (by C-G formula based on SCr of 2.66 mg/dL (H)).   Medications:  Infusions:   heparin 1,500 Units/hr (12/16/20 1058)    Assessment: 50 yo male presents to ED on 10/5 with complains of SOB, chest pain, abdominal pain, N/V.  CT showed bilateral PEs.  Pharmacy consulted to dose heparin drip for PE.  No prior AC noted.   Today, 12/17/2020: Heparin level 0.39,  therapeutic on heparin 1500 unit/hr CBC WNL INR 2.0 Scr up to 2.66 No bleeding or IV complications reported   Goal of Therapy:  Heparin level 0.3-0.7 units/ml Monitor platelets by anticoagulation protocol: Yes   Plan:  Continue heparin IV infusion at 1500 units/hr Daily heparin level and CBC  Follow up long-term anticoagulation plans.   Arley Phenix RPh 12/17/2020, 5:38 AM

## 2020-12-17 NOTE — TOC Initial Note (Addendum)
Transition of Care [facility]) - Initial/Assessment Note    Patient Details  Name: Dylan Chaney MRN: [id number] Date of Birth: 1970-03-19  Transition of Care [facility]) CM/SW Contact:    Crystin Lechtenberg, LCSWA Phone Number: 12/17/2020, 4:21 PM  Clinical Narrative:                 HF CSW briefly spoke with [name] at bedside he provided the CSW with his case worker's name and number, [name] [phone] and provided verbal permission for the CSW to outreach April. [name] reported his cell phone number is [phone] and confirmed that he has been living at the [facility] [address] [place] [address].   CSW will continue to follow throughout discharge.    Barriers to Discharge: Continued Medical Work up, Inadequate or no insurance   Patient Goals and CMS Choice        Expected Discharge Plan and Services   In-house Referral: Clinical Social Work     Living arrangements for the past 2 months: Hotel/Motel                                      Prior Living Arrangements/Services Living arrangements for the past 2 months: Hotel/Motel   Patient language and need for interpreter reviewed:: Yes        Need for Family Participation in Patient Care: No (Comment) Care giver support system in place?: No (comment)   Criminal Activity/Legal Involvement Pertinent to Current Situation/Hospitalization: No - Comment as needed  Activities of Daily Living Home Assistive Devices/Equipment: Eyeglasses ADL Screening (condition at time of admission) Patient's cognitive ability adequate to safely complete daily activities?: Yes Is the patient deaf or have difficulty hearing?: No Does the patient have difficulty seeing, even when wearing glasses/contacts?: No Does the patient have difficulty concentrating, remembering, or making decisions?: No Patient able to express need for assistance with ADLs?: Yes Does the patient have difficulty dressing or bathing?: Yes  (secondary to weakness) Independently performs ADLs?: No Communication: Independent Dressing (OT): Needs assistance Is this a change from baseline?: Change from baseline, expected to last >3 days Grooming: Independent Feeding: Independent Bathing: Needs assistance Is this a change from baseline?: Change from baseline, expected to last >3 days Toileting: Needs assistance Is this a change from baseline?: Change from baseline, expected to last >3days In/Out Bed: Needs assistance Is this a change from baseline?: Change from baseline, expected to last >3 days Walks in Home: Needs assistance Is this a change from baseline?: Change from baseline, expected to last >3 days Does the patient have difficulty walking or climbing stairs?: Yes (secondary to weakness) Weakness of Legs: Both Weakness of Arms/Hands: None  Permission Sought/Granted   Permission granted to share information with : Yes, Verbal Permission Granted  Share Information with NAME: [name]  Permission granted to share info w AGENCY: Case Worker  Permission granted to share info w Relationship: Case Worker  Permission granted to share info w Contact Information: [phone]  Emotional Assessment Appearance:: Appears stated age Attitude/Demeanor/Rapport: Engaged, Lethargic Affect (typically observed): Quiet, Pleasant Orientation: : Oriented to Self, Oriented to Place, Oriented to  Time, Oriented to Situation   Psych Involvement: No (comment)  Admission diagnosis:  Pulmonary embolism (HCC) [I26.99] Acute CHF (congestive heart failure) (HCC) [I50.9] Heart failure, unspecified HF chronicity, unspecified heart failure type (HCC) [I50.9] Multiple subsegmental pulmonary emboli without acute cor pulmonale (HCC) [I26.94]  Patient Active Problem List   Diagnosis Date Noted   Cardiogenic shock [facility])    CHF (congestive heart failure) (HCC) 12/16/2020   Pulmonary embolism (HCC) 12/16/2020   Elevated LFTs 12/16/2020    Elevated troponin 12/16/2020   Chronic renal insufficiency 12/16/2020   Heart failure [facility])    PCP:  Dylan Sharps, NP Pharmacy:   [facility] - [place], [place] - [address] [place] [address] Phone: [phone] Fax: [phone]     Social Determinants of Health (SDOH) Interventions Housing Interventions: Other (Comment) (Pt. reports living at a [facility] [address] [place])  Readmission Risk Interventions No flowsheet data found.  Dylan Chaney, MSW, LCSWA [phone] Heart Failure Social Worker

## 2020-12-17 NOTE — Procedures (Signed)
Central Venous Catheter Insertion Procedure Note  Dylan Chaney  [id number]  Aug 23, 1970  Date:12/17/20  Time:12:47 PM   Provider Performing:Jalea Bronaugh D. Harris   Procedure: Insertion of Non-tunneled Central Venous [phone]) with US guidance ([id number])   Indication(s) Medication administration  Consent Risks of the procedure as well as the alternatives and risks of each were explained to the patient and/or caregiver.  Consent for the procedure was obtained and is signed in the bedside chart  Anesthesia Topical only with 1% lidocaine   Timeout Verified patient identification, verified procedure, site/side was marked, verified correct patient position, special equipment/implants available, medications/allergies/relevant history reviewed, required imaging and test results available.  Sterile Technique Maximal sterile technique including full sterile barrier drape, hand hygiene, sterile gown, sterile gloves, mask, hair covering, sterile ultrasound probe cover (if used).  Procedure Description Area of catheter insertion was cleaned with chlorhexidine and draped in sterile fashion.  With real-time ultrasound guidance a central venous catheter was placed into the left internal jugular vein. Nonpulsatile blood flow and easy flushing noted in all ports.  The catheter was sutured in place and sterile dressing applied.  Complications/Tolerance None; patient tolerated the procedure well. Chest X-ray is ordered to verify placement for internal jugular or subclavian cannulation.   Chest x-ray is not ordered for femoral cannulation.  EBL Minimal  Specimen(s) None  Dylan Chaney D. [name], NP-C [place] [facility] Personal contact information can be found on Amion  12/17/2020, 12:47 PM

## 2020-12-17 NOTE — TOC Progression Note (Signed)
Transition of Care [facility]) - Progression Note    Patient Details  Name: Dylan Chaney MRN: [id number] Date of Birth: Mar 10, 1971  Transition of Care [facility]) CM/SW Contact  Starlette Thurow, Olegario Messier, RN Phone Number: 12/17/2020, 11:04 AM  Clinical Narrative:  Noted for transfer to step down.          Expected Discharge Plan and Services                                                 Social Determinants of Health (SDOH) Interventions    Readmission Risk Interventions No flowsheet data found.

## 2020-12-17 NOTE — Consult Note (Addendum)
Advanced Heart Failure Team Consult Note   Primary Physician: Lavinia Sharps, NP PCP-Cardiologist:  None  Reason for Consultation: Cardiogenic Shock  HPI:    Dylan Chaney is seen today for evaluation of cardiogenic shock at the request of Dr Ella Jubilee.   Mr Klemann is a 50 year old with history of enlarged prostate, CKD IIIB, remote ETOH/drug abuse. No previous history of coronary disease.   Presented to [facility] with chest pain and increased shortness of breath. CTA with bilateral PE. Echo completed with severely reduced EF <20%. Hypotensive, tachycardic today. Concern for cardiogenic shock. LFTS and renal funciton Transferred to [facility] for Advanced HF Team consultation. CVC placed prior to transfer. CO-OX 59% and lactic acid 1.8. Started  Take to cath lab for swan placement.   Cardiac Testing.  Echo 12/17/20 EF < 20% RV severely reduced.   Review of Systems: [y] = yes, [ ]  = no   General: Weight gain [ ] ; Weight loss [ ] ; Anorexia [ ] ; Fatigue [ ] ; Fever [ ] ; Chills [ ] ; Weakness [ ]   Cardiac: Chest pain/pressure [ ] ; Resting SOB [ ] ; Exertional SOB [ ] ; Orthopnea [ ] ; Pedal Edema [ ] ; Palpitations [ ] ; Syncope [ ] ; Presyncope [ ] ; Paroxysmal nocturnal dyspnea[ ]   Pulmonary: Cough [ ] ; Wheezing[ ] ; Hemoptysis[ ] ; Sputum [ ] ; Snoring [ ]   GI: Vomiting[ ] ; Dysphagia[ ] ; Melena[ ] ; Hematochezia [ ] ; Heartburn[ ] ; Abdominal pain [ ] ; Constipation [ ] ; Diarrhea [ ] ; BRBPR [ ]   GU: Hematuria[ ] ; Dysuria [ ] ; Nocturia[ ]   Vascular: Pain in legs with walking [ ] ; Pain in feet with lying flat [ ] ; Non-healing sores [ ] ; Stroke [ ] ; TIA [ ] ; Slurred speech [ ] ;  Neuro: Headaches[ ] ; Vertigo[ ] ; Seizures[ ] ; Paresthesias[ ] ;Blurred vision [ ] ; Diplopia [ ] ; Vision changes [ ]   Ortho/Skin: Arthritis [ ] ; Joint pain [ ] ; Muscle pain [ ] ; Joint swelling [ ] ; Back Pain [ ] ; Rash [ ]   Psych: Depression[ ] ; Anxiety[ ]   Heme: Bleeding problems [ ] ; Clotting disorders [ ] ; Anemia [ ]   Endocrine:  Diabetes [ ] ; Thyroid dysfunction[ ]   Home Medications Prior to Admission medications   Medication Sig Start Date End Date Taking? Authorizing Provider  omeprazole (PRILOSEC) 20 MG capsule Take 20 mg by mouth daily. 12/06/20  Yes [provider]  promethazine (PHENERGAN) 25 MG tablet Take 25 mg by mouth 4 (four) times daily as needed. 12/06/20  Yes [provider]  tamsulosin (FLOMAX) 0.4 MG CAPS capsule Take 0.8 mg by mouth at bedtime. 11/23/20  Yes [provider]    Past Medical History: Past Medical History:  Diagnosis Date   Prostate enlargement    Substance abuse (HCC)     Past Surgical History: History reviewed. No pertinent surgical history.  Family History: Family History  Problem Relation Age of Onset   Sudden Cardiac Death Neg Hx     Social History: Social History   Socioeconomic History   Marital status: Single    Spouse name: Not on file   Number of children: Not on file   Years of education: Not on file   Highest education level: Not on file  Occupational History   Not on file  Tobacco Use   Smoking status: Former    Types: Cigarettes   Smokeless tobacco: Never  Vaping Use   Vaping Use: Never used  Substance and Sexual Activity   Alcohol use: Not Currently  Comment: none since 2014   Drug use: Not Currently    Types: Cocaine, Marijuana    Comment: none since 2018   Sexual activity: Not on file  Other Topics Concern   Not on file  Social History Narrative   Not on file   Social Determinants of Health   Financial Resource Strain: Not on file  Food Insecurity: Not on file  Transportation Needs: Not on file  Physical Activity: Not on file  Stress: Not on file  Social Connections: Not on file    Allergies:  No Known Allergies  Objective:    Vital Signs:   Temp:  [97.5 F (36.4 C)-98.7 F (37.1 C)] 98.7 F (37.1 C) (10/07 0500) Pulse Rate:  [90-103] 98 (10/07 0500) Resp:  [11-20] 14 (10/07 0800) BP:  (83-103)/(62-81) 88/75 (10/07 0500) SpO2:  [96 %-100 %] 97 % (10/06 2300)    Weight change: Filed Weights   12/15/20 1653  Weight: 92.1 kg    Intake/Output:   Intake/Output Summary (Last 24 hours) at 12/17/2020 1325 Last data filed at 12/17/2020 0600 Gross per 24 hour  Intake 520.06 ml  Output 200 ml  Net 320.06 ml      Physical Exam    General:  Seen in the cath lab.  HEENT: normal Neck: supple. JVP 6-7 . Carotids 2+ bilat; no bruits. No lymphadenopathy or thyromegaly appreciated. Cor: PMI nondisplaced. Tachy Regular rate & rhythm. No rubs, gallops or murmurs. Lungs: clear Abdomen: soft, nontender, nondistended. No hepatosplenomegaly. No bruits or masses. Good bowel sounds. Extremities: no cyanosis, clubbing, rash, edema Neuro: alert & orientedx3, cranial nerves grossly intact. moves all 4 extremities w/o difficulty. Affect pleasant   Telemetry   Sinus Tach 100s   EKG    Sinus Tach   Labs   Basic Metabolic Panel: Recent Labs  Lab 12/15/20 1716 12/16/20 0634 12/17/20 0457  NA 140 135 134*  K 4.7 4.7 4.7  CL 107 104 100  CO2 21* 19* 18*  GLUCOSE 176* 161* 151*  BUN 20 32* 49*  CREATININE 1.82* 1.83* 2.66*  CALCIUM 9.5 8.9 8.7*  MG  --  2.2 2.3    Liver Function Tests: Recent Labs  Lab 12/15/20 1716 12/16/20 0634 12/17/20 0457  AST 126* 404* 1,377*  ALT 202* 443* 1,248*  ALKPHOS 38 36* 37*  BILITOT 4.2* 4.0* 4.7*  PROT 6.3* 6.0* 6.0*  ALBUMIN 3.6 3.7 3.6   Recent Labs  Lab 12/15/20 1716  LIPASE 73*   No results for input(s): AMMONIA in the last 168 hours.  CBC: Recent Labs  Lab 12/15/20 1716 12/16/20 0634 12/17/20 0457  WBC 7.8 9.2 11.1*  NEUTROABS 5.2  --   --   HGB 15.5 15.0 14.5  HCT 45.1 43.8 41.8  MCV 85.6 84.4 83.4  PLT 168 163 165    Cardiac Enzymes: No results for input(s): CKTOTAL, CKMB, CKMBINDEX, TROPONINI in the last 168 hours.  BNP: BNP (last 3 results) Recent Labs    12/15/20 1716 12/17/20 0457  BNP  2,024.7* 1,742.2*    ProBNP (last 3 results) No results for input(s): PROBNP in the last 8760 hours.   CBG: No results for input(s): GLUCAP in the last 168 hours.  Coagulation Studies: Recent Labs    12/16/20 0040 12/17/20 0457  LABPROT 18.6* 23.0*  INR 1.6* 2.0*     Imaging   ECHOCARDIOGRAM COMPLETE  Result Date: 12/17/2020    ECHOCARDIOGRAM REPORT   Patient Name:   Jeannetta Nap Date of Exam:  12/17/2020 Medical Rec #:  [id number]      Height:       72.0 in Accession #:    [id number]     Weight:       203.0 lb Date of Birth:  October 28, 1970       BSA:          2.144 m Patient Age:    50 years       BP:           88/75 mmHg Patient Gender: M              HR:           96 bpm. Exam Location:  Inpatient Procedure: 2D Echo STAT ECHO Indications:    Pulmonary embolism, elevated troponin  History:        Patient has no prior history of Echocardiogram examinations. No                 known history.  Sonographer:    Gertie Fey RDMS, RVT, RDCS Referring Phys: [id number] TIMOTHY S OPYD IMPRESSIONS  1. Left ventricular ejection fraction, by estimation, is <20%. The left ventricle has severely decreased function. The left ventricle demonstrates global hypokinesis. The left ventricular internal cavity size was severely dilated. Left ventricular diastolic parameters are consistent with Grade III diastolic dysfunction (restrictive).  2. Right ventricular systolic function is severely reduced. The right ventricular size is mildly enlarged. There is moderately elevated pulmonary artery systolic pressure. The estimated right ventricular systolic pressure is 48.6 mmHg.  3. Left atrial size was severely dilated.  4. Right atrial size was severely dilated.  5. Functional MR from dilated LV. The mitral valve is normal in structure. Severe mitral valve regurgitation. No evidence of mitral stenosis.  6. Tricuspid valve regurgitation is moderate.  7. The aortic valve is normal in structure. Aortic valve regurgitation  is trivial. No aortic stenosis is present.  8. The inferior vena cava is normal in size with greater than 50% respiratory variability, suggesting right atrial pressure of 3 mmHg. FINDINGS  Left Ventricle: Left ventricular ejection fraction, by estimation, is <20%. The left ventricle has severely decreased function. The left ventricle demonstrates global hypokinesis. The left ventricular internal cavity size was severely dilated. There is no left ventricular hypertrophy. Left ventricular diastolic parameters are consistent with Grade III diastolic dysfunction (restrictive). Right Ventricle: The right ventricular size is mildly enlarged. No increase in right ventricular wall thickness. Right ventricular systolic function is severely reduced. There is moderately elevated pulmonary artery systolic pressure. The tricuspid regurgitant velocity is 2.90 m/s, and with an assumed right atrial pressure of 15 mmHg, the estimated right ventricular systolic pressure is 48.6 mmHg. Left Atrium: Left atrial size was severely dilated. Right Atrium: Right atrial size was severely dilated. Pericardium: There is no evidence of pericardial effusion. Mitral Valve: Functional MR from dilated LV. The mitral valve is normal in structure. Severe mitral valve regurgitation, with posteriorly-directed jet. No evidence of mitral valve stenosis. Tricuspid Valve: The tricuspid valve is normal in structure. Tricuspid valve regurgitation is moderate . No evidence of tricuspid stenosis. Aortic Valve: The aortic valve is normal in structure. Aortic valve regurgitation is trivial. No aortic stenosis is present. Aortic valve mean gradient measures 1.0 mmHg. Aortic valve peak gradient measures 1.7 mmHg. Aortic valve area, by VTI measures 2.94 cm. Pulmonic Valve: The pulmonic valve was normal in structure. Pulmonic valve regurgitation is mild to moderate. No evidence of pulmonic stenosis. Aorta: The aortic root is  normal in size and structure. Venous: The  inferior vena cava is normal in size with greater than 50% respiratory variability, suggesting right atrial pressure of 3 mmHg. IAS/Shunts: No atrial level shunt detected by color flow Doppler.  LEFT VENTRICLE PLAX 2D LVIDd:         7.60 cm LVIDs:         6.90 cm LV PW:         0.62 cm LV IVS:        0.60 cm LVOT diam:     2.30 cm LV SV:         26 LV SV Index:   12 LVOT Area:     4.15 cm  LV Volumes (MOD) LV vol d, MOD A4C: 192.0 ml LV vol s, MOD A4C: 151.0 ml LV SV MOD A4C:     192.0 ml RIGHT VENTRICLE RV S prime:     7.20 cm/s TAPSE (M-mode): 1.1 cm LEFT ATRIUM              Index        RIGHT ATRIUM           Index LA diam:        5.35 cm  2.50 cm/m   RA Area:     40.80 cm LA Vol (A2C):   170.0 ml 79.29 ml/m  RA Volume:   183.00 ml 85.35 ml/m LA Vol (A4C):   102.0 ml 47.57 ml/m LA Biplane Vol: 145.0 ml 67.63 ml/m  AORTIC VALVE                    PULMONIC VALVE AV Area (Vmax):    3.17 cm     PR End Diast Vel: 7.62 msec AV Area (Vmean):   2.69 cm AV Area (VTI):     2.94 cm AV Vmax:           65.90 cm/s AV Vmean:          52.200 cm/s AV VTI:            0.088 m AV Peak Grad:      1.7 mmHg AV Mean Grad:      1.0 mmHg LVOT Vmax:         50.30 cm/s LVOT Vmean:        33.800 cm/s LVOT VTI:          0.062 m LVOT/AV VTI ratio: 0.71  AORTA Ao Root diam: 2.70 cm MITRAL VALVE                TRICUSPID VALVE MV Area (PHT): 7.66 cm     TR Peak grad:   33.6 mmHg MV Decel Time: 99 msec      TR Vmax:        290.00 cm/s MR Peak grad: 57.3 mmHg MR Mean grad: 36.5 mmHg     SHUNTS MR Vmax:      378.50 cm/s   Systemic VTI:  0.06 m MR Vmean:     285.0 cm/s    Systemic Diam: 2.30 cm MV E velocity: 111.00 cm/s MV A velocity: 29.10 cm/s MV E/A ratio:  3.81 Donato Schultz MD Electronically signed by Donato Schultz MD Signature Date/Time: 12/17/2020/11:31:02 AM    Final      Medications:     Current Medications:  Chlorhexidine Gluconate Cloth  6 each Topical Daily   pantoprazole  40 mg Oral Daily   tamsulosin  0.8 mg Oral QHS  Infusions:  heparin 1,500 Units/hr (12/17/20 1206)   norepinephrine (LEVOPHED) Adult infusion 2 mcg/min (12/17/20 1229)      Assessment/Plan    Acute Biventricular Systolic Heart Failure -->Cardiogenic Shock  -ECHO with severely reduced EF < 20%. RV severely reduced -Evidence of MSOF in the setting of shock  - Blood Cx obtained.  - On Norepi 2 mcg.  - CO-OX 59%. Lactic Acid 1.8 - Swan + a line now.  - HS Trop 35> 40. Hold off on LHC with creatinine > 2.   2. Acute PE  CTA + bilateral PE  - On Heparin drip   3. Elevated LFTs  Rising in setting of shock   4. AKI on CKD 3B - baseline SCr 1.8-2.0 - due to ATN from shock   5. H/O ETOH/Substance Abuse.  - denies active use   No family listed and he does not have anyone called about his condition.    Length of Stay: 1  Amy Clegg, NP  12/17/2020, 1:25 PM  Advanced Heart Failure Team Pager [phone] (M-F; 7a - 5p)  Please contact [facility] for night-coverage after hours (4p -7a ) and weekends on [email or web address]  Agree with above. 50 y/o male as above with no known heart disease.   Presented with 3-4 months of progressive HF symptoms. CT scan felt to have bilateral small PEs. F/u echo shows severe biventricular dysfunction with EF < 20. Developed shock and transferred emergently here.   Taken to cath lab. Hemodynamics not too bad on low -dose NE  General:  Weak appearing. No resp difficulty HEENT: normal Neck: supple. JVD to ear  Carotids 2+ bilat; no bruits. No lymphadenopathy or thryomegaly appreciated. Cor: PMI nondisplaced. Regular rate & rhythm. + s3 Lungs: clear Abdomen: soft, nontender, nondistended. No hepatosplenomegaly. No bruits or masses. Good bowel sounds. Extremities: no cyanosis, clubbing, rash, edema Neuro: alert & orientedx3, cranial nerves grossly intact. moves all 4 extremities w/o difficulty. Affect pleasant  He has severe biventricular HF. Given appearance on echo suspect this is probably  somewhat longstanding. Course now c/b bilateral PEs likely due to stasis. Hemodynamics currently not as bad as I expected btu he is at very high risk for decompensation. Keep Swan in and keep in ICU.   Not candidate for advanced therapies or transplant with RV failure, CKD and social situation.   CRITICAL CARE Performed by: Arvilla Meres  Total critical care time: 55 minutes  Critical care time was exclusive of separately billable procedures and treating other patients.  Critical care was necessary to treat or prevent imminent or life-threatening deterioration.  Critical care was time spent personally by me (independent of midlevel providers or residents) on the following activities: development of treatment plan with patient and/or surrogate as well as nursing, discussions with consultants, evaluation of patient's response to treatment, examination of patient, obtaining history from patient or surrogate, ordering and performing treatments and interventions, ordering and review of laboratory studies, ordering and review of radiographic studies, pulse oximetry and re-evaluation of patient's condition.  Arvilla Meres, MD  5:02 PM

## 2020-12-17 NOTE — Progress Notes (Signed)
   Swan placed this afternoon.  RA 14 PA 51/23 (36)  PCWP 29  CO 4.5  CI 2.1    Hemodynamics Worse RA 10-11 PA 50/25 (36)  SVR 1696  CO 3.3 CI 1. 5   Increase norepi to 5 mcg add milrinone 0.125 mcg and start IV lasix 40 mg twice a day.    Discussed with Dr Gala Romney.   Emberleigh Reily NP-C  4:52 PM

## 2020-12-17 NOTE — Progress Notes (Addendum)
ANTICOAGULATION CONSULT NOTE - Follow Up Consult  Pharmacy Consult for Heparin Indication: pulmonary embolus  No Known Allergies  Patient Measurements: Height: 6\' 1"  (185.4 cm) Weight: 89.5 kg (197 lb 5 oz) IBW/kg (Calculated) : 79.9 Heparin Dosing Weight: 92.1 kg  Vital Signs: Temp: 98.1 F (36.7 C) (10/07 1515) Temp Source: Oral (10/07 1515) BP: 100/81 (10/07 1515) Pulse Rate: 100 (10/07 1545)  Labs: Recent Labs    12/15/20 1716 12/15/20 2256 12/16/20 0040 12/16/20 0634 12/16/20 0827 12/16/20 1500 12/17/20 0457 12/17/20 1122 12/17/20 1235 12/17/20 1423  HGB 15.5  --   --  15.0  --   --  14.5  --   --  12.9*  13.9  HCT 45.1  --   --  43.8  --   --  41.8  --   --  38.0*  41.0  PLT 168  --   --  163  --   --  165  --   --   --   APTT  --   --  30  --   --   --   --   --   --   --   LABPROT  --   --  18.6*  --   --   --  23.0*  --   --   --   INR  --   --  1.6*  --   --   --  2.0*  --   --   --   HEPARINUNFRC  --   --   --   --  0.50 0.48 0.39  --   --   --   CREATININE 1.82*  --   --  1.83*  --   --  2.66*  --   --   --   CKTOTAL  --   --   --   --   --   --   --  149  --   --   CKMB  --   --   --   --   --   --   --  3.2  --   --   TROPONINIHS 43* 47*  --   --   --   --   --  35* 40*  --      Estimated Creatinine Clearance: 37.1 mL/min (A) (by C-G formula based on SCr of 2.66 mg/dL (H)).   Medications:  Infusions:   heparin 1,500 Units/hr (12/17/20 1307)   norepinephrine (LEVOPHED) Adult infusion 2 mcg/min (12/17/20 1307)    Assessment: 50 yo male with acute bilateral PE, moderate clot burden, no right heart strain.  Pharmacy consulted to dose heparin drip for PE.  No prior AC noted.   HL 0.39 this morning, dropped slightly after first level. H/H, plt stable. LFTs are up due to shock. INR up to 2. No signes of bleeding per RN, old blood around IJ.   Goal of Therapy:  Heparin level 0.3-0.7 units/ml Monitor platelets by anticoagulation protocol: Yes    Plan:  Continue heparin IV infusion 1500 units/hr Daily heparin level and CBC Monitor for signs of bleeding Follow up long-term anticoagulation plans.    44, PharmD, BCPS, BCCP Clinical Pharmacist  Please check AMION for all [facility] Pharmacy phone numbers After 10:00 PM, call Main Pharmacy [phone]

## 2020-12-17 NOTE — Progress Notes (Signed)
2D echocardiogram completed.  12/17/2020 11:03 AM Eula Fried., MHA, RVT, RDCS, RDMS

## 2020-12-17 NOTE — Progress Notes (Addendum)
Echocardiogram with sever systolic dysfunction, patient clinically in cardiogenic shock Will call advance heart failure team.   11:51 I spoke with heart failure team, patient will be placed on norepinephrine infusion. Transfer to ICU 2H in [facility]. I called patient placement for urgent transfer.

## 2020-12-17 NOTE — Progress Notes (Signed)
This RN received report from [facility] on 4E for patient to receive closer monitoring. Patient being transferred to [facility]. This RN gave report to [place] [facility] [place] on 2 Heart at [employer]. Patient updated, all questions answered, patient verbalized understanding.

## 2020-12-18 LAB — URINE CULTURE: Culture: NO GROWTH

## 2020-12-18 LAB — COOXEMETRY PANEL
Carboxyhemoglobin: 0.8 % (ref 0.5–1.5)
Carboxyhemoglobin: 0.9 % (ref 0.5–1.5)
Carboxyhemoglobin: 1 % (ref 0.5–1.5)
Methemoglobin: 0.9 % (ref 0.0–1.5)
Methemoglobin: 0.9 % (ref 0.0–1.5)
Methemoglobin: 0.8 % (ref 0.0–1.5)
O2 Saturation: 39.8 %
O2 Saturation: 47 %
O2 Saturation: 56.1 %
Total hemoglobin: 13.7 g/dL (ref 12.0–16.0)
Total hemoglobin: 13.9 g/dL (ref 12.0–16.0)
Total hemoglobin: 13.5 g/dL (ref 12.0–16.0)

## 2020-12-18 LAB — HEPARIN LEVEL (UNFRACTIONATED): Heparin Unfractionated: 0.64 IU/mL (ref 0.30–0.70)

## 2020-12-18 LAB — COMPREHENSIVE METABOLIC PANEL
ALT: 1118 U/L — ABNORMAL HIGH (ref 0–44)
AST: 678 U/L — ABNORMAL HIGH (ref 15–41)
Albumin: 3.2 g/dL — ABNORMAL LOW (ref 3.5–5.0)
Alkaline Phosphatase: 36 U/L — ABNORMAL LOW (ref 38–126)
Anion gap: 11 (ref 5–15)
BUN: 36 mg/dL — ABNORMAL HIGH (ref 6–20)
CO2: 23 mmol/L (ref 22–32)
Calcium: 8.2 mg/dL — ABNORMAL LOW (ref 8.9–10.3)
Chloride: 99 mmol/L (ref 98–111)
Creatinine, Ser: 1.77 mg/dL — ABNORMAL HIGH (ref 0.61–1.24)
GFR, Estimated: 46 mL/min — ABNORMAL LOW (ref >60)
Glucose, Bld: 158 mg/dL — ABNORMAL HIGH (ref 70–99)
Potassium: 3.6 mmol/L (ref 3.5–5.1)
Sodium: 133 mmol/L — ABNORMAL LOW (ref 135–145)
Total Bilirubin: 4.2 mg/dL — ABNORMAL HIGH (ref 0.3–1.2)
Total Protein: 5.5 g/dL — ABNORMAL LOW (ref 6.5–8.1)

## 2020-12-18 LAB — CBC
HCT: 38.7 % — ABNORMAL LOW (ref 39.0–52.0)
Hemoglobin: 13.4 g/dL (ref 13.0–17.0)
MCH: 28.8 pg (ref 26.0–34.0)
MCHC: 34.6 g/dL (ref 30.0–36.0)
MCV: 83 fL (ref 80.0–100.0)
Platelets: 137 10*3/uL — ABNORMAL LOW (ref 150–400)
RBC: 4.66 MIL/uL (ref 4.22–5.81)
RDW: 14 % (ref 11.5–15.5)
WBC: 6.3 10*3/uL (ref 4.0–10.5)
nRBC: 0.5 % — ABNORMAL HIGH (ref 0.0–0.2)

## 2020-12-18 LAB — ABO/RH: ABO/RH(D): B POS

## 2020-12-18 LAB — PROCALCITONIN: Procalcitonin: 1.27 ng/mL

## 2020-12-18 LAB — MAGNESIUM: Magnesium: 2 mg/dL (ref 1.7–2.4)

## 2020-12-18 LAB — HEPATITIS C ANTIBODY: HCV Ab: 4.7 s/co ratio — ABNORMAL HIGH (ref 0.0–0.9)

## 2020-12-18 MED ORDER — POTASSIUM CHLORIDE CRYS ER 20 MEQ PO TBCR
40.0000 meq | EXTENDED_RELEASE_TABLET | Freq: Once | ORAL | Status: AC
  Administered 2020-12-18: 40 meq via ORAL
  Filled 2020-12-18: qty 2

## 2020-12-18 MED ORDER — DIGOXIN 125 MCG PO TABS
0.1250 mg | ORAL_TABLET | Freq: Every day | ORAL | Status: DC
  Administered 2020-12-18 – 2021-01-05 (×18): 0.125 mg via ORAL
  Filled 2020-12-18 (×18): qty 1

## 2020-12-18 MED ORDER — ASPIRIN EC 81 MG PO TBEC
81.0000 mg | DELAYED_RELEASE_TABLET | Freq: Every day | ORAL | Status: DC
  Administered 2020-12-18 – 2021-01-05 (×18): 81 mg via ORAL
  Filled 2020-12-18 (×18): qty 1

## 2020-12-18 NOTE — Progress Notes (Addendum)
NAME:  Dylan Chaney, MRN:  [id number], DOB:  12-16-1970, LOS: 2 ADMISSION DATE:  12/15/2020, CONSULTATION DATE:  12/17/2020 REFERRING MD:  Dr. Cathlean Sauer, CHIEF COMPLAINT:  Shock    History of Present Illness:  Dylan Chaney is a 50 y.o. male with a past medical history significant for prostate enlargement, chronic kidney disease, and remote history of alcohol and substance abuse who presented to the emergency room for complaints of chest and back pain with associated shortness of breath.  Patient states he started feeling unwell approximately 8 to 12 weeks ago with major symptom being chest discomfort and abdominal pain with associated nausea and vomiting.  When the chest pain and shortness of breath began and patient decided to present to the emergency department.  On arrival to ED patient was seen hypotensive, tachycardic, tachypneic, and borderline hypoxic.  Given concern for cardiogenic component and/or pulmonary embolism patient underwent CT of chest and abdomen which revealed moderate bilateral pulmonary embolism with severe cardiomegaly.  Patient was admitted per hospitalist team.  By morning but 10/7 patient was seen with worsening shock signs with progressive hypotension, tachycardia, and hypothermia.  L lab work also indicated multisystem organ dysfunction including worsening AKI, and worsening transaminitis indicating shock liver.  PCCM consulted for further management and transfer to ICU  Pertinent  Medical History  Enlarged prostate CKD Remote history of alcohol and substance abuse  Significant Hospital Events: Including procedures, antibiotic start and stop dates in addition to other pertinent events   10/6 admitted for bilateral PE and likely new onset congestive heart failure 10/7 worsening shock state resulting in PCCM consulted and transferred to ICU 10/8 HF consulted for cardiogenic shock, cardiac cath  Interim History / Subjective:  S/p transfer to [facility] and cardiac cath  demonstrating biventricular heart failure   Eating breakfast. Reports SOB improved. Excellent UOP (5.1L) in the last 24 hours with vasopressor and diuretic support  Objective   Blood pressure 91/76, pulse (!) 121, temperature 98.8 F (37.1 C), resp. rate (!) 8, height _0  (1.854 m), weight 89.5 kg, SpO2 95 %. PAP: (39-53)/(20-31) 43/28 CVP:  [5 mmHg-16 mmHg] 14 mmHg CO:  [3.3 L/min-3.5 L/min] 3.3 L/min CI:  [1.5 L/min/m2-1.7 L/min/m2] 1.5 L/min/m2      Intake/Output Summary (Last 24 hours) at 12/18/2020 1106 Last data filed at 12/18/2020 1100 Gross per 24 hour  Intake 883.66 ml  Output 5850 ml  Net -4966.34 ml   Filed Weights   12/15/20 1653 12/17/20 1536  Weight: 92.1 kg 89.5 kg   Physical Exam: General: Well-appearing, no acute distress HENT: [place], AT, OP clear, MMM Eyes: EOMI, no scleral icterus Respiratory: Diminished breath sounds bilaterally.  No crackles, wheezing or rales Cardiovascular: RRR, -M/R/G, no JVD GI: BS+, soft, nontender Extremities:-Edema,-tenderness Neuro: AAO x4, CNII-XII grossly intact  Resolved Hospital Problem list     Assessment & Plan:  Cardiogenic shock (EF <20%) with biventricular failure with multisystem organ failure -Can not rule out hypovolemic vs septic shock at this time. Cultures sent P: Remain in ICU for vasopressor support for MAP goal >65 Continue levophed and milrinone Appreciate Heart failure team management. Diuresis Strict I/Os Hold on antibiotics given alternative cause for shock F/u cultures Trend co-ox  Acute bilateral PE with bilateral DVTs Acute hypoxemic respiratory failure -CTA chest with moderate amount of bilateral pulmonary embolism, small right pleural effusion and moderate severity cardiomegaly. -PESI score 101 -Doppler positive for DVT right posterior tibial and DVT left peroneal veins. P: Wean oxygen for goal SpO2 >88%  Given location of clot mechanical embolectomy not viable  If shock state were to worsen  could consider systemic tPA Continue systemic heparin drip  No SCD   Acute Kidney Injury superimposed on CKD stage 3a - improving -in the setting of shock as above, Creatinine on admit 1.82 since uptrended to 2.66 with minimal urine output  Acute anion gap metabolic acidosis  P: Follow renal function  Monitor urine output Trend Bmet Avoid nephrotoxins' Ensure adequate renal perfusion   Shock liver - improving -LFT elevated on admit with significant increase 10/7 AST 1,377, ALT 1,248, Alk phos 37, total billirubin 4.7 Coagulopathy  -INR 2.0, PTT 23.0 P: Treat underlying cause as above  Avoid hepatotoxins   Hyponatremia secondary to volume overload- stable P: Trend Bmet  Diuresis as above  Discussed with HF team. Pulmonary will be available as needed and HF team will be primary  Best Practice   Diet/type: Regular consistency (see orders) DVT prophylaxis: systemic heparin GI prophylaxis: PPI Lines: N/A Foley:  N/A Code Status:  full code Last date of multidisciplinary goals of care discussion: Pending   Labs   Improving WBC (normalized), LFTs, AKI. Mild hyponatremia  CBC: Recent Labs  Lab 12/15/20 1716 12/16/20 0634 12/17/20 0457 12/17/20 1423 12/18/20 0330  WBC 7.8 9.2 11.1*  --  6.3  NEUTROABS 5.2  --   --   --   --   HGB 15.5 15.0 14.5 12.9*  13.9 13.4  HCT 45.1 43.8 41.8 38.0*  41.0 38.7*  MCV 85.6 84.4 83.4  --  83.0  PLT 168 163 165  --  137*    Basic Metabolic Panel: Recent Labs  Lab 12/15/20 1716 12/16/20 0634 12/17/20 0457 12/17/20 1423 12/18/20 0330  NA 140 135 134* 139  135 133*  K 4.7 4.7 4.7 3.6  4.0 3.6  CL 107 104 100  --  99  CO2 21* 19* 18*  --  23  GLUCOSE 176* 161* 151*  --  158*  BUN 20 32* 49*  --  36*  CREATININE 1.82* 1.83* 2.66*  --  1.77*  CALCIUM 9.5 8.9 8.7*  --  8.2*  MG  --  2.2 2.3  --  2.0   GFR: Estimated Creatinine Clearance: 55.8 mL/min (A) (by C-G formula based on SCr of 1.77 mg/dL (H)). Recent Labs  Lab  12/15/20 1716 12/16/20 0634 12/17/20 0457 12/17/20 1115 12/17/20 1122 12/17/20 1235 12/18/20 0330  PROCALCITON  --   --   --   --  1.22  --  1.27  WBC 7.8 9.2 11.1*  --   --   --  6.3  LATICACIDVEN  --   --   --  2.7*  --  1.8  --     Liver Function Tests: Recent Labs  Lab 12/15/20 1716 12/16/20 0634 12/17/20 0457 12/18/20 0330  AST 126* 404* 1,377* 678*  ALT 202* 443* 1,248* 1,118*  ALKPHOS 38 36* 37* 36*  BILITOT 4.2* 4.0* 4.7* 4.2*  PROT 6.3* 6.0* 6.0* 5.5*  ALBUMIN 3.6 3.7 3.6 3.2*   Recent Labs  Lab 12/15/20 1716  LIPASE 73*   No results for input(s): AMMONIA in the last 168 hours.  The patient is critically ill with multiple organ systems failure and requires high complexity decision making for assessment and support, frequent evaluation and titration of therapies, application of advanced monitoring technologies and extensive interpretation of multiple databases.  Independent Critical Care Time: 70 Minutes.   Rodman Pickle, M.D. [facility] Pulmonary/Critical Care  Medicine 12/18/2020 11:07 AM   Please see Amion for pager number to reach on-call Pulmonary and Critical Care Team.

## 2020-12-18 NOTE — Progress Notes (Signed)
Advanced Heart Failure Rounding Note   Subjective:    Feels a bit better today. Still with some ab pain. Appetite still poor.   On NE 5 and milrinone 0.25. Also on heparin. Brisk urine output with IV lasix (out 5L)  Co-ox 63% -> 40%-> 47%  Scr 2.66 -> 1.77. LFTs coming down   Swan numbers done personally with RNs RA = 11 PA = 47/27 (35) PCW = 25 Thermo CO/CI = 3.0/1.4 SVR 1641 PVR 4.0 WU   Objective:   Weight Range:  Vital Signs:   Temp:  [97.3 F (36.3 C)-98.8 F (37.1 C)] 98.2 F (36.8 C) (10/08 1000) Pulse Rate:  [51-124] 110 (10/08 1000) Resp:  [0-30] 23 (10/08 1000) BP: (85-149)/(67-129) 95/83 (10/08 1000) SpO2:  [0 %-100 %] 95 % (10/08 1000) Arterial Line BP: (95-120)/(58-83) 97/69 (10/08 1000) Weight:  [89.5 kg] 89.5 kg (10/07 1536)    Weight change: Filed Weights   12/15/20 1653 12/17/20 1536  Weight: 92.1 kg 89.5 kg    Intake/Output:   Intake/Output Summary (Last 24 hours) at 12/18/2020 1054 Last data filed at 12/18/2020 1000 Gross per 24 hour  Intake 843.24 ml  Output 5850 ml  Net -5006.76 ml     Physical Exam: General:  Weak appearing. No resp difficulty HEENT: normal Neck: supple. RIJ swan. Carotids 2+ bilat; no bruits. No lymphadenopathy or thryomegaly appreciated. Cor: PMI nondisplaced. Regular tachy + s3  Lungs: clear Abdomen: soft, nontender, nondistended. No hepatosplenomegaly. No bruits or masses. Good bowel sounds. Extremities: no cyanosis, clubbing, rash, 1+ edema Neuro: alert & orientedx3, cranial nerves grossly intact. moves all 4 extremities w/o difficulty. Affect pleasant   Telemetry: Sinus tach 110-120 Personally reviewed  Labs: Basic Metabolic Panel: Recent Labs  Lab 12/15/20 1716 12/16/20 0634 12/17/20 0457 12/17/20 1423 12/18/20 0330  NA 140 135 134* 139  135 133*  K 4.7 4.7 4.7 3.6  4.0 3.6  CL 107 104 100  --  99  CO2 21* 19* 18*  --  23  GLUCOSE 176* 161* 151*  --  158*  BUN 20 32* 49*  --  36*   CREATININE 1.82* 1.83* 2.66*  --  1.77*  CALCIUM 9.5 8.9 8.7*  --  8.2*  MG  --  2.2 2.3  --  2.0    Liver Function Tests: Recent Labs  Lab 12/15/20 1716 12/16/20 0634 12/17/20 0457 12/18/20 0330  AST 126* 404* 1,377* 678*  ALT 202* 443* 1,248* 1,118*  ALKPHOS 38 36* 37* 36*  BILITOT 4.2* 4.0* 4.7* 4.2*  PROT 6.3* 6.0* 6.0* 5.5*  ALBUMIN 3.6 3.7 3.6 3.2*   Recent Labs  Lab 12/15/20 1716  LIPASE 73*   No results for input(s): AMMONIA in the last 168 hours.  CBC: Recent Labs  Lab 12/15/20 1716 12/16/20 0634 12/17/20 0457 12/17/20 1423 12/18/20 0330  WBC 7.8 9.2 11.1*  --  6.3  NEUTROABS 5.2  --   --   --   --   HGB 15.5 15.0 14.5 12.9*  13.9 13.4  HCT 45.1 43.8 41.8 38.0*  41.0 38.7*  MCV 85.6 84.4 83.4  --  83.0  PLT 168 163 165  --  137*    Cardiac Enzymes: Recent Labs  Lab 12/17/20 1122  CKTOTAL 149  CKMB 3.2    BNP: BNP (last 3 results) Recent Labs    12/15/20 1716 12/17/20 0457  BNP 2,024.7* 1,742.2*    ProBNP (last 3 results) No results for input(s): PROBNP in  the last 8760 hours.    Other results:  Imaging: CARDIAC CATHETERIZATION  Result Date: 12/17/2020 Findings: RA = 14 RV = 48/18 PA = 51/23 (36) PCW = 29 Fick cardiac output/index = 4.5/2.11 Thermo CO/Ci = 4.6/2.2 PVR = 1.5 SVR = 1120 FA sat = 95% PA sat = 63%, 69% Assessment: 1. Elevated biventricular pressures with moderately reduced output Plan/Discussion: He is at high-risk for decompensation. Admit to ICU. Follow closely. Arvilla Meres, MD 5:11 PM  US Abdomen Limited  Result Date: 12/17/2020 CLINICAL DATA:  A 50 year old male presents with liver failure. EXAM: ULTRASOUND ABDOMEN LIMITED RIGHT UPPER QUADRANT COMPARISON:  Comparison made with abdominal CT which was performed on December 16, 2020. FINDINGS: Gallbladder: Large gallstone measuring 2.2 cm surrounded by sludge in the gallbladder. No reported tenderness over the gallbladder. Mild wall thickening. Small amount of  pericholecystic fluid. Common bile duct: Diameter: 5.8 mm Liver: Increased hepatic echogenicity without visible lesion. Portal vein is patent on color Doppler imaging with normal direction of blood flow towards the liver. Other: None. IMPRESSION: Gallbladder wall thickening with cholelithiasis, no reported tenderness over the gallbladder and small amount of pericholecystic fluid. Findings are nonspecific, more likely related to congestive hepatopathy. If there is clinical concern for acute cholecystitis consider HIDA scan for further evaluation for added specificity. Sludge also in the gallbladder lumen. Hepatic steatosis. Electronically Signed   By: Donzetta Kohut M.D.   On: 12/17/2020 13:43   DG CHEST PORT 1 VIEW  Result Date: 12/17/2020 CLINICAL DATA:  Pulmonary arterial hypertension, central chest pain radiating to both sides, history CHF, pulmonary embolism, former smoker EXAM: PORTABLE CHEST 1 VIEW COMPARISON:  Portable exam 1740 hours compared to 1257 hours FINDINGS: RIGHT jugular Swan-Ganz catheter with tip projecting over proximal RIGHT pulmonary artery. LEFT jugular line with tip projecting over SVC. Enlargement of cardiac silhouette with pulmonary vascular congestion. Lungs clear. No infiltrate, pleural effusion, or pneumothorax. IMPRESSION: No pneumothorax following line placement. Enlargement of cardiac silhouette with pulmonary vascular congestion. Electronically Signed   By: Ulyses Southward M.D.   On: 12/17/2020 17:50   DG Chest Port 1 View  Result Date: 12/17/2020 CLINICAL DATA:  Central line placement in a 50 year old male. EXAM: PORTABLE CHEST 1 VIEW COMPARISON:  CT evaluation of December 16, 2020 and prior chest x-ray December 15, 2020. FINDINGS: Since the previous study there is been interval placement of a LEFT IJ central venous line that terminates at the caval to atrial junction. Heart size remains enlarged with globular appearance stable compared to previous imaging. Lungs are well inflated,  no consolidation, visible pneumothorax or sign of pleural effusion. EKG leads project over the chest. No acute skeletal process is noted on limited assessment. IMPRESSION: LEFT IJ central venous line placement with tip at the caval to atrial junction. No pneumothorax. Stable marked cardiomegaly. Electronically Signed   By: Donzetta Kohut M.D.   On: 12/17/2020 13:38   ECHOCARDIOGRAM COMPLETE  Result Date: 12/17/2020    ECHOCARDIOGRAM REPORT   Patient Name:   GARNELL PHENIX Date of Exam: 12/17/2020 Medical Rec #:  [id number]      Height:       72.0 in Accession #:    [id number]     Weight:       203.0 lb Date of Birth:  02-Sep-1970       BSA:          2.144 m Patient Age:    50 years       BP:  88/75 mmHg Patient Gender: M              HR:           96 bpm. Exam Location:  Inpatient Procedure: 2D Echo STAT ECHO Indications:    Pulmonary embolism, elevated troponin  History:        Patient has no prior history of Echocardiogram examinations. No                 known history.  Sonographer:    Gertie Fey RDMS, RVT, RDCS Referring Phys: [id number] TIMOTHY S OPYD IMPRESSIONS  1. Left ventricular ejection fraction, by estimation, is <20%. The left ventricle has severely decreased function. The left ventricle demonstrates global hypokinesis. The left ventricular internal cavity size was severely dilated. Left ventricular diastolic parameters are consistent with Grade III diastolic dysfunction (restrictive).  2. Right ventricular systolic function is severely reduced. The right ventricular size is mildly enlarged. There is moderately elevated pulmonary artery systolic pressure. The estimated right ventricular systolic pressure is 48.6 mmHg.  3. Left atrial size was severely dilated.  4. Right atrial size was severely dilated.  5. Functional MR from dilated LV. The mitral valve is normal in structure. Severe mitral valve regurgitation. No evidence of mitral stenosis.  6. Tricuspid valve regurgitation is moderate.   7. The aortic valve is normal in structure. Aortic valve regurgitation is trivial. No aortic stenosis is present.  8. The inferior vena cava is normal in size with greater than 50% respiratory variability, suggesting right atrial pressure of 3 mmHg. FINDINGS  Left Ventricle: Left ventricular ejection fraction, by estimation, is <20%. The left ventricle has severely decreased function. The left ventricle demonstrates global hypokinesis. The left ventricular internal cavity size was severely dilated. There is no left ventricular hypertrophy. Left ventricular diastolic parameters are consistent with Grade III diastolic dysfunction (restrictive). Right Ventricle: The right ventricular size is mildly enlarged. No increase in right ventricular wall thickness. Right ventricular systolic function is severely reduced. There is moderately elevated pulmonary artery systolic pressure. The tricuspid regurgitant velocity is 2.90 m/s, and with an assumed right atrial pressure of 15 mmHg, the estimated right ventricular systolic pressure is 48.6 mmHg. Left Atrium: Left atrial size was severely dilated. Right Atrium: Right atrial size was severely dilated. Pericardium: There is no evidence of pericardial effusion. Mitral Valve: Functional MR from dilated LV. The mitral valve is normal in structure. Severe mitral valve regurgitation, with posteriorly-directed jet. No evidence of mitral valve stenosis. Tricuspid Valve: The tricuspid valve is normal in structure. Tricuspid valve regurgitation is moderate . No evidence of tricuspid stenosis. Aortic Valve: The aortic valve is normal in structure. Aortic valve regurgitation is trivial. No aortic stenosis is present. Aortic valve mean gradient measures 1.0 mmHg. Aortic valve peak gradient measures 1.7 mmHg. Aortic valve area, by VTI measures 2.94 cm. Pulmonic Valve: The pulmonic valve was normal in structure. Pulmonic valve regurgitation is mild to moderate. No evidence of pulmonic  stenosis. Aorta: The aortic root is normal in size and structure. Venous: The inferior vena cava is normal in size with greater than 50% respiratory variability, suggesting right atrial pressure of 3 mmHg. IAS/Shunts: No atrial level shunt detected by color flow Doppler.  LEFT VENTRICLE PLAX 2D LVIDd:         7.60 cm LVIDs:         6.90 cm LV PW:         0.62 cm LV IVS:  0.60 cm LVOT diam:     2.30 cm LV SV:         26 LV SV Index:   12 LVOT Area:     4.15 cm  LV Volumes (MOD) LV vol d, MOD A4C: 192.0 ml LV vol s, MOD A4C: 151.0 ml LV SV MOD A4C:     192.0 ml RIGHT VENTRICLE RV S prime:     7.20 cm/s TAPSE (M-mode): 1.1 cm LEFT ATRIUM              Index        RIGHT ATRIUM           Index LA diam:        5.35 cm  2.50 cm/m   RA Area:     40.80 cm LA Vol (A2C):   170.0 ml 79.29 ml/m  RA Volume:   183.00 ml 85.35 ml/m LA Vol (A4C):   102.0 ml 47.57 ml/m LA Biplane Vol: 145.0 ml 67.63 ml/m  AORTIC VALVE                    PULMONIC VALVE AV Area (Vmax):    3.17 cm     PR End Diast Vel: 7.62 msec AV Area (Vmean):   2.69 cm AV Area (VTI):     2.94 cm AV Vmax:           65.90 cm/s AV Vmean:          52.200 cm/s AV VTI:            0.088 m AV Peak Grad:      1.7 mmHg AV Mean Grad:      1.0 mmHg LVOT Vmax:         50.30 cm/s LVOT Vmean:        33.800 cm/s LVOT VTI:          0.062 m LVOT/AV VTI ratio: 0.71  AORTA Ao Root diam: 2.70 cm MITRAL VALVE                TRICUSPID VALVE MV Area (PHT): 7.66 cm     TR Peak grad:   33.6 mmHg MV Decel Time: 99 msec      TR Vmax:        290.00 cm/s MR Peak grad: 57.3 mmHg MR Mean grad: 36.5 mmHg     SHUNTS MR Vmax:      378.50 cm/s   Systemic VTI:  0.06 m MR Vmean:     285.0 cm/s    Systemic Diam: 2.30 cm MV E velocity: 111.00 cm/s MV A velocity: 29.10 cm/s MV E/A ratio:  3.81 Donato Schultz MD Electronically signed by Donato Schultz MD Signature Date/Time: 12/17/2020/11:31:02 AM    Final    VAS [place] LOWER EXTREMITY VENOUS (DVT)  Result Date: 12/16/2020  Lower Venous DVT Study  Patient Name:  AVRAHAM BENISH  Date of Exam:   12/16/2020 Medical Rec #: [id number]       Accession #:    [id number] Date of Birth: 01-27-1971        Patient Gender: M Patient Age:   17 years Exam Location:  [facility] Procedure:      VAS [place] LOWER EXTREMITY VENOUS (DVT) Referring Phys: TIMOTHY OPYD --------------------------------------------------------------------------------  Indications: Pulmonary embolism, and Pain. Other Indications: Patient with SOB, abdominal pain and bilateral leg pain for 3  months. Comparison Study: No previous exams Performing Technologist: Jody Hill RVT, RDMS  Examination Guidelines: A complete evaluation includes B-mode imaging, spectral Doppler, color Doppler, and power Doppler as needed of all accessible portions of each vessel. Bilateral testing is considered an integral part of a complete examination. Limited examinations for reoccurring indications may be performed as noted. The reflux portion of the exam is performed with the patient in reverse Trendelenburg.  +---------+---------------+---------+-----------+----------+-----------------+ RIGHT    CompressibilityPhasicitySpontaneityPropertiesThrombus Aging    +---------+---------------+---------+-----------+----------+-----------------+ CFV      Full           Yes      Yes                                    +---------+---------------+---------+-----------+----------+-----------------+ SFJ      Full                                                           +---------+---------------+---------+-----------+----------+-----------------+ FV Prox  Full           Yes      Yes                                    +---------+---------------+---------+-----------+----------+-----------------+ FV Mid   Full           Yes      Yes                                    +---------+---------------+---------+-----------+----------+-----------------+ FV DistalFull           Yes      Yes                                     +---------+---------------+---------+-----------+----------+-----------------+ PFV      Full                                                           +---------+---------------+---------+-----------+----------+-----------------+ POP      Full           Yes      Yes                                    +---------+---------------+---------+-----------+----------+-----------------+ PTV      None           No       No                   Age Indeterminate +---------+---------------+---------+-----------+----------+-----------------+ PERO     Full                                                           +---------+---------------+---------+-----------+----------+-----------------+   +---------+---------------+---------+-----------+----------+--------------+  LEFT     CompressibilityPhasicitySpontaneityPropertiesThrombus Aging +---------+---------------+---------+-----------+----------+--------------+ CFV      Full           Yes      Yes                                 +---------+---------------+---------+-----------+----------+--------------+ SFJ      Full                                                        +---------+---------------+---------+-----------+----------+--------------+ FV Prox  Full           Yes      Yes                                 +---------+---------------+---------+-----------+----------+--------------+ FV Mid   Full           Yes      Yes                                 +---------+---------------+---------+-----------+----------+--------------+ FV DistalFull           Yes      Yes                                 +---------+---------------+---------+-----------+----------+--------------+ PFV      Full                                                        +---------+---------------+---------+-----------+----------+--------------+ POP      Full           Yes      Yes                                  +---------+---------------+---------+-----------+----------+--------------+ PTV      Full                                                        +---------+---------------+---------+-----------+----------+--------------+ PERO     None           No       No                   Acute          +---------+---------------+---------+-----------+----------+--------------+ VVI      None           No       No                   Acute          +---------+---------------+---------+-----------+----------+--------------+ Thrombus noted in several internal varicose and perforator veins located within the left calf.    Summary: BILATERAL: -No  evidence of popliteal cyst, bilaterally. RIGHT: - Findings consistent with age indeterminate deep vein thrombosis involving the right posterior tibial veins.  LEFT: - Findings consistent with acute deep vein thrombosis involving the left peroneal veins. - Findings consistent with acute superficial vein thrombosis involving the left varicosities or other superficial veins.  *See table(s) above for measurements and observations. Electronically signed by Heath Lark on 12/16/2020 at 2:26:47 PM.    Final      Medications:     Scheduled Medications:  Chlorhexidine Gluconate Cloth  6 each Topical Daily   furosemide  40 mg Intravenous BID   pantoprazole  40 mg Oral Daily   potassium chloride  40 mEq Oral Once   tamsulosin  0.8 mg Oral QHS    Infusions:  heparin 1,500 Units/hr (12/18/20 1000)   milrinone 0.25 mcg/kg/min (12/18/20 1000)   norepinephrine (LEVOPHED) Adult infusion 5 mcg/min (12/18/20 1000)    PRN Medications: fentaNYL (SUBLIMAZE) injection, ondansetron **OR** ondansetron (ZOFRAN) IV, traMADol   Assessment/Plan:   1. 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 RV severely reduced - Evidence of MSOF in the setting of shock  - Blood Cx obtained. NGTD - Echo suggests  NICM but he reports 1 month h/o severe R arm pain and CP prior to decompensation. Will need cath +/- cMRI next week - Co-ox remains low despite milrinone and NE. Titrate as needed. Will increase milrinone to 0.375 - Renal function improving - Continue IV lasix - Add digoxin 0.125  - Given social situation advanced therapies would be challenging but if he does not recover with medical therapy, he has extensive support from [facility]. We will work to get him Medicaid to keep transplant option on the table for him. Not VAD candidate with severe RV dysfunction - He is HCV positive. HIV nonreactive - Check blood type   2. Acute PE  - CTA + bilateral segmental/subsegmental PE likely due to low output - Continue Heparin drip    3. Shock liver - improving with hemodynamic support   4. AKI on CKD 3B - baseline SCr 1.8-2.0 - due to ATN from shock  - hopefully will continue to improve with hemodynamic support  5. HCV - will need viral RNA quant   6. 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 [facility] needs - I spoke with his caseworker April Anders [phone]). He was incarcerated almost a decade ago but not since. He has been homeless for years but recently moved into a hotel in June through the [facility]. 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 No substance use. Always first to volunteer to help other [employer] members move and do other tasks. Has a sister out of state but doesn't her to be contacted until he is improving. [employer] members are very attached to him and said they would drive him back and forth to [facility] for transplant eval,if needed. Other [employer] contact: Brother John: [phone]   CRITICAL CARE Performed by: Arvilla Meres  Total critical care time: 55 minutes  Critical care time was exclusive of separately billable  procedures and treating other patients.  Critical care was necessary to treat or prevent imminent or life-threatening deterioration.  Critical care was time spent personally by me (independent of  midlevel providers or residents) on the following activities: development of treatment plan with patient and/or surrogate as well as nursing, discussions with consultants, evaluation of patient's response to treatment, examination of patient, obtaining history from patient or surrogate, ordering and performing treatments and interventions, ordering and review of laboratory studies, ordering and review of radiographic studies, pulse oximetry and re-evaluation of patient's condition.    Length of Stay: 2   Arvilla Meres MD 12/18/2020, 10:54 AM  Advanced Heart Failure Team Pager [phone] (M-F; 7a - 4p)  Please contact [facility] for night-coverage after hours (4p -7a ) and weekends on [email or web address]

## 2020-12-18 NOTE — Progress Notes (Signed)
ANTICOAGULATION CONSULT NOTE - Follow Up Consult  Pharmacy Consult for Heparin Indication: pulmonary embolus  No Known Allergies  Patient Measurements: Height: 6\' 1"  (185.4 cm) Weight: 89.5 kg (197 lb 5 oz) IBW/kg (Calculated) : 79.9 Heparin Dosing Weight: 92.1 kg  Vital Signs: Temp: 97.5 F (36.4 C) (10/08 0700) Temp Source: Core (10/08 0400) BP: 101/75 (10/08 0700) Pulse Rate: 105 (10/08 0700)  Labs: Recent Labs    12/15/20 2256 12/16/20 0040 12/16/20 0634 12/16/20 0827 12/16/20 1500 12/17/20 0457 12/17/20 1122 12/17/20 1235 12/17/20 1423 12/18/20 0330  HGB  --   --  15.0  --   --  14.5  --   --  12.9*  13.9 13.4  HCT  --   --  43.8  --   --  41.8  --   --  38.0*  41.0 38.7*  PLT  --   --  163  --   --  165  --   --   --  137*  APTT  --  30  --   --   --   --   --   --   --   --   LABPROT  --  18.6*  --   --   --  23.0*  --   --   --   --   INR  --  1.6*  --   --   --  2.0*  --   --   --   --   HEPARINUNFRC  --   --   --    < > 0.48 0.39  --   --   --  0.64  CREATININE  --   --  1.83*  --   --  2.66*  --   --   --  1.77*  CKTOTAL  --   --   --   --   --   --  149  --   --   --   CKMB  --   --   --   --   --   --  3.2  --   --   --   TROPONINIHS 47*  --   --   --   --   --  35* 40*  --   --    < > = values in this interval not displayed.     Estimated Creatinine Clearance: 55.8 mL/min (A) (by C-G formula based on SCr of 1.77 mg/dL (H)).   Medications:  Infusions:   heparin 1,500 Units/hr (12/18/20 0700)   milrinone 0.25 mcg/kg/min (12/18/20 0700)   norepinephrine (LEVOPHED) Adult infusion 5 mcg/min (12/18/20 0700)    Assessment: 50 yo male with acute bilateral PE, moderate clot burden, no right heart strain.  Pharmacy consulted to dose heparin drip for PE.  No prior AC noted.   HL 0.64 this morning. H/H, plt stable. LFTs are up due to shock. INR up to 2 from liver dysfunction (no Coumadin given). No signs of bleeding per RN, old blood around IJ.  Goal  of Therapy:  Heparin level 0.3-0.7 units/ml Monitor platelets by anticoagulation protocol: Yes   Plan:  Continue heparin IV infusion 1500 units/hr Daily heparin level and CBC Monitor for signs of bleeding Follow up long-term anticoagulation plans.   44, Reece Leader, BCCP Clinical Pharmacist  12/18/2020 8:07 AM   [facility] pharmacy phone numbers are listed on [email or web address]

## 2020-12-18 NOTE — Progress Notes (Deleted)
NAME:  Dylan Chaney, MRN:  [id number], DOB:  March 10, 1971, LOS: 2 ADMISSION DATE:  12/15/2020, CONSULTATION DATE:  12/17/2020 REFERRING MD:  Dr. Ella Jubilee, CHIEF COMPLAINT:  Shock    History of Present Illness:  Dylan Chaney is a 50 y.o. male with a past medical history significant for prostate enlargement, chronic kidney disease, and remote history of alcohol and substance abuse with 2-3 months of feeling ill with nausea, poor intake, chest, and abdominal discomfort, with increasing chest pain and shortness of breath.  Found on workup to have bilateral pulmonary embolisms with echo showing EF <20% and severely depressed RV systolic function with AKI and transaminitis.  Started on heparin gtt. Transferred to [facility] for HF management.   Pertinent  Medical History  Enlarged prostate CKD IIIb Remote history of alcohol and substance abuse  Significant Hospital Events: Including procedures, antibiotic start and stop dates in addition to other pertinent events   10/6 admitted for bilateral PE and likely new onset congestive heart failure 10/7 worsening shock state resulting in PCCM consulted and transferred to ICU-> [facility] for HF management, cath lab-> RHC and swan placement  Interim History / Subjective:  NE increased to 35mcg/min and milrinone at 0.25 mcg/kg/min given worsening hemodynamics last evening  Co-ox 47 RA 16 PA 46/24 CO 3.3 CI 1.5  afebrile UOP 5.1L/ 24hrs, good response to diuresis -4.3L/ net -3.5L Improving sCr 2.66-> 1.77 On 1L [place]  Reports some intermittent SOB and CP, overall feeling better  Objective   Blood pressure 95/83, pulse (!) 110, temperature 98.2 F (36.8 C), resp. rate (!) 23, height 6\' 1"  (1.854 m), weight 89.5 kg, SpO2 95 %. PAP: (39-53)/(20-31) 49/30 CVP:  [5 mmHg-16 mmHg] 16 mmHg CO:  [3.3 L/min-3.5 L/min] 3.3 L/min CI:  [1.5 L/min/m2-1.7 L/min/m2] 1.5 L/min/m2      Intake/Output Summary (Last 24 hours) at 12/18/2020 1013 Last data filed at 12/18/2020  1000 Gross per 24 hour  Intake 843.24 ml  Output 5850 ml  Net -5006.76 ml   Filed Weights   12/15/20 1653 12/17/20 1536  Weight: 92.1 kg 89.5 kg   Examination: General: adult male sitting upright in bed resting, no acute distress HEENT: MM pink/moist Neuro: Alert, soft spoken, appropriate, MAE  CV: rr ir, no murmur, +2 pulses/ warm distally, R IJ PA cath, L IJ CVL, R radial Aline PULM:  non labored, clear and diminished in bases GI: soft, bs+, NT, voids Extremities: warm/dry, trace edema to LLE Skin: no rashes, tattoos   Resolved Hospital Problem list     Assessment & Plan:   Cardiogenic shock with biventricular heart failure  - suspected acute on chronic component of HF given progressive symptoms over several months - PCT 1.22-> 1.27, however afebrile, normal WBC, SVR 1696 (10/7)/ no distributive component  P: Per HF team- will assume primary care, discussed with Dr. 02/16/21 Trending hemodynamics, co-ox Diuresis per HF, lasix 40mg  BID currently, closely monitoring electrolytes NE at 5 and dobutamine per HF Strict I/Os, weight  Will need LHC when renal function normalizes  Trending LFTs  Acute bilateral PE -CTA chest with moderate amount of bilateral pulmonary embolism, small right pleural effusion and moderate severity cardiomegaly. -PESI score 101 Bilateral lower extremityy DVT -Doppler positive for DVT right posterior tibial and DVT left peroneal veins. - not candidate for mechanical embolectomy given clot location P: Heparin gtt per pharmacy ? Secondary to low flow state 2/2 biventricular HF Continue systemic heparin drip for now and eventually change to DOAC when stable/ renal  function normalizes No SCD   Acute Kidney Injury superimposed on CKD stage 3a -secondary to cardiogenic shock Acute anion gap metabolic acidosis - improving  BPH P: Improving sCr today and stable UOP/ response to diuretics as above Diuresis per HF Strict I/Os Trend BMP / urinary  output Replace electrolytes as indicated Avoid nephrotoxic agents, ensure adequate renal perfusion Continue flomax   Shock liver with coagulopathy secondary to cardiogenic shock P: Treat underlying cause as above  Avoid hepatotoxins  Serial LFT/ coags  Hyponatremia  P: Likely in the setting of HF Trend BMET  Best Practice   Diet/type: NPO DVT prophylaxis: systemic heparin GI prophylaxis: PPI Lines: N/A Foley:  N/A Code Status:  full code Last date of multidisciplinary goals of care discussion: Pending   Labs   CBC: Recent Labs  Lab 12/15/20 1716 12/16/20 0634 12/17/20 0457 12/17/20 1423 12/18/20 0330  WBC 7.8 9.2 11.1*  --  6.3  NEUTROABS 5.2  --   --   --   --   HGB 15.5 15.0 14.5 12.9*  13.9 13.4  HCT 45.1 43.8 41.8 38.0*  41.0 38.7*  MCV 85.6 84.4 83.4  --  83.0  PLT 168 163 165  --  137*    Basic Metabolic Panel: Recent Labs  Lab 12/15/20 1716 12/16/20 0634 12/17/20 0457 12/17/20 1423 12/18/20 0330  NA 140 135 134* 139  135 133*  K 4.7 4.7 4.7 3.6  4.0 3.6  CL 107 104 100  --  99  CO2 21* 19* 18*  --  23  GLUCOSE 176* 161* 151*  --  158*  BUN 20 32* 49*  --  36*  CREATININE 1.82* 1.83* 2.66*  --  1.77*  CALCIUM 9.5 8.9 8.7*  --  8.2*  MG  --  2.2 2.3  --  2.0   GFR: Estimated Creatinine Clearance: 55.8 mL/min (A) (by C-G formula based on SCr of 1.77 mg/dL (H)). Recent Labs  Lab 12/15/20 1716 12/16/20 0634 12/17/20 0457 12/17/20 1115 12/17/20 1122 12/17/20 1235 12/18/20 0330  PROCALCITON  --   --   --   --  1.22  --  1.27  WBC 7.8 9.2 11.1*  --   --   --  6.3  LATICACIDVEN  --   --   --  2.7*  --  1.8  --     Liver Function Tests: Recent Labs  Lab 12/15/20 1716 12/16/20 0634 12/17/20 0457 12/18/20 0330  AST 126* 404* 1,377* 678*  ALT 202* 443* 1,248* 1,118*  ALKPHOS 38 36* 37* 36*  BILITOT 4.2* 4.0* 4.7* 4.2*  PROT 6.3* 6.0* 6.0* 5.5*  ALBUMIN 3.6 3.7 3.6 3.2*   Recent Labs  Lab 12/15/20 1716  LIPASE 73*   No  results for input(s): AMMONIA in the last 168 hours.  ABG    Component Value Date/Time   HCO3 22.7 12/17/2020 1423   HCO3 21.1 12/17/2020 1423   TCO2 24 12/17/2020 1423   TCO2 22 12/17/2020 1423   ACIDBASEDEF 2.0 12/17/2020 1423   ACIDBASEDEF 4.0 (H) 12/17/2020 1423   O2SAT 47.0 12/18/2020 0858     Coagulation Profile: Recent Labs  Lab 12/16/20 0040 12/17/20 0457  INR 1.6* 2.0*    Cardiac Enzymes: Recent Labs  Lab 12/17/20 1122  CKTOTAL 149  CKMB 3.2    HbA1C: No results found for: HGBA1C  CBG: No results for input(s): GLUCAP in the last 168 hours.   Critical care time:   CRITICAL CARE Performed by:  Posey Boyer   Total critical care time: 30 minutes  Critical care time was exclusive of separately billable procedures and treating other patients.  Critical care was necessary to treat or prevent imminent or life-threatening deterioration.  Critical care was time spent personally by me on the following activities: development of treatment plan with patient and/or surrogate as well as nursing, discussions with consultants, evaluation of patient's response to treatment, examination of patient, obtaining history from patient or surrogate, ordering and performing treatments and interventions, ordering and review of laboratory studies, ordering and review of radiographic studies, pulse oximetry and re-evaluation of patient's condition.   Posey Boyer, ACNP [facility] 12/18/2020, 10:13 AM  See Amion for pager If no response to pager, please call PCCM consult pager After 7:00 pm call Elink

## 2020-12-19 LAB — BASIC METABOLIC PANEL
Anion gap: 12 (ref 5–15)
BUN: 20 mg/dL (ref 6–20)
CO2: 23 mmol/L (ref 22–32)
Calcium: 8.2 mg/dL — ABNORMAL LOW (ref 8.9–10.3)
Chloride: 91 mmol/L — ABNORMAL LOW (ref 98–111)
Creatinine, Ser: 1.26 mg/dL — ABNORMAL HIGH (ref 0.61–1.24)
GFR, Estimated: >60 mL/min (ref >60)
Glucose, Bld: 181 mg/dL — ABNORMAL HIGH (ref 70–99)
Potassium: 3.7 mmol/L (ref 3.5–5.1)
Sodium: 126 mmol/L — ABNORMAL LOW (ref 135–145)

## 2020-12-19 LAB — CBC
HCT: 36.3 % — ABNORMAL LOW (ref 39.0–52.0)
Hemoglobin: 12.9 g/dL — ABNORMAL LOW (ref 13.0–17.0)
MCH: 29.1 pg (ref 26.0–34.0)
MCHC: 35.5 g/dL (ref 30.0–36.0)
MCV: 81.9 fL (ref 80.0–100.0)
Platelets: 110 10*3/uL — ABNORMAL LOW (ref 150–400)
RBC: 4.43 MIL/uL (ref 4.22–5.81)
RDW: 13.9 % (ref 11.5–15.5)
WBC: 4.9 10*3/uL (ref 4.0–10.5)
nRBC: 0 % (ref 0.0–0.2)

## 2020-12-19 LAB — COMPREHENSIVE METABOLIC PANEL
ALT: 1084 U/L — ABNORMAL HIGH (ref 0–44)
AST: 603 U/L — ABNORMAL HIGH (ref 15–41)
Albumin: 3 g/dL — ABNORMAL LOW (ref 3.5–5.0)
Alkaline Phosphatase: 36 U/L — ABNORMAL LOW (ref 38–126)
Anion gap: 8 (ref 5–15)
BUN: 21 mg/dL — ABNORMAL HIGH (ref 6–20)
CO2: 26 mmol/L (ref 22–32)
Calcium: 8 mg/dL — ABNORMAL LOW (ref 8.9–10.3)
Chloride: 94 mmol/L — ABNORMAL LOW (ref 98–111)
Creatinine, Ser: 1.44 mg/dL — ABNORMAL HIGH (ref 0.61–1.24)
GFR, Estimated: 59 mL/min — ABNORMAL LOW (ref >60)
Glucose, Bld: 144 mg/dL — ABNORMAL HIGH (ref 70–99)
Potassium: 3.5 mmol/L (ref 3.5–5.1)
Sodium: 128 mmol/L — ABNORMAL LOW (ref 135–145)
Total Bilirubin: 3.1 mg/dL — ABNORMAL HIGH (ref 0.3–1.2)
Total Protein: 5.3 g/dL — ABNORMAL LOW (ref 6.5–8.1)

## 2020-12-19 LAB — COOXEMETRY PANEL
Carboxyhemoglobin: 1.3 % (ref 0.5–1.5)
Carboxyhemoglobin: 0.8 % (ref 0.5–1.5)
Carboxyhemoglobin: 1.3 % (ref 0.5–1.5)
Methemoglobin: 0.8 % (ref 0.0–1.5)
Methemoglobin: 0.8 % (ref 0.0–1.5)
Methemoglobin: 0.8 % (ref 0.0–1.5)
O2 Saturation: 63.3 %
O2 Saturation: 45 %
O2 Saturation: 60.2 %
Total hemoglobin: 13 g/dL (ref 12.0–16.0)
Total hemoglobin: 13.8 g/dL (ref 12.0–16.0)
Total hemoglobin: 13.3 g/dL (ref 12.0–16.0)

## 2020-12-19 LAB — MAGNESIUM
Magnesium: 1.8 mg/dL (ref 1.7–2.4)
Magnesium: 2.1 mg/dL (ref 1.7–2.4)

## 2020-12-19 LAB — TYPE AND SCREEN
ABO/RH(D): B POS
Antibody Screen: NEGATIVE

## 2020-12-19 LAB — HEPARIN LEVEL (UNFRACTIONATED): Heparin Unfractionated: 0.59 IU/mL (ref 0.30–0.70)

## 2020-12-19 LAB — PROTIME-INR
INR: 1.4 — ABNORMAL HIGH (ref 0.8–1.2)
Prothrombin Time: 17.3 seconds — ABNORMAL HIGH (ref 11.4–15.2)

## 2020-12-19 LAB — PROCALCITONIN: Procalcitonin: 0.95 ng/mL

## 2020-12-19 MED ORDER — DOCUSATE SODIUM 100 MG PO CAPS
100.0000 mg | ORAL_CAPSULE | Freq: Two times a day (BID) | ORAL | Status: DC
  Administered 2020-12-19 – 2021-01-05 (×33): 100 mg via ORAL
  Filled 2020-12-19 (×34): qty 1

## 2020-12-19 MED ORDER — MAGNESIUM SULFATE 2 GM/50ML IV SOLN
2.0000 g | Freq: Once | INTRAVENOUS | Status: AC
  Administered 2020-12-19: 2 g via INTRAVENOUS
  Filled 2020-12-19: qty 50

## 2020-12-19 MED ORDER — ALBUMIN HUMAN 25 % IV SOLN
12.5000 g | Freq: Once | INTRAVENOUS | Status: AC
  Administered 2020-12-19: 12.5 g via INTRAVENOUS

## 2020-12-19 MED ORDER — POTASSIUM CHLORIDE CRYS ER 20 MEQ PO TBCR
40.0000 meq | EXTENDED_RELEASE_TABLET | Freq: Two times a day (BID) | ORAL | Status: DC
  Administered 2020-12-19 – 2020-12-22 (×6): 40 meq via ORAL
  Filled 2020-12-19 (×6): qty 2

## 2020-12-19 NOTE — Progress Notes (Signed)
NAME:  Dylan Chaney, MRN:  [id number], DOB:  10-Sep-1970, LOS: 3 ADMISSION DATE:  12/15/2020, CONSULTATION DATE:  12/17/2020 REFERRING MD:  Dr. Cathlean Sauer, CHIEF COMPLAINT:  Shock    History of Present Illness:  Dylan Chaney is a 50 y.o. male with a past medical history significant for prostate enlargement, chronic kidney disease, and remote history of alcohol and substance abuse who presented to the emergency room for complaints of chest and back pain with associated shortness of breath.  Patient states he started feeling unwell approximately 8 to 12 weeks ago with major symptom being chest discomfort and abdominal pain with associated nausea and vomiting.  When the chest pain and shortness of breath began and patient decided to present to the emergency department.  On arrival to ED patient was seen hypotensive, tachycardic, tachypneic, and borderline hypoxic.  Given concern for cardiogenic component and/or pulmonary embolism patient underwent CT of chest and abdomen which revealed moderate bilateral pulmonary embolism with severe cardiomegaly.  Patient was admitted per hospitalist team.  By morning but 10/7 patient was seen with worsening shock signs with progressive hypotension, tachycardia, and hypothermia.  L lab work also indicated multisystem organ dysfunction including worsening AKI, and worsening transaminitis indicating shock liver.  PCCM consulted for further management and transfer to ICU  Pertinent  Medical History  Enlarged prostate CKD Remote history of alcohol and substance abuse  Significant Hospital Events: Including procedures, antibiotic start and stop dates in addition to other pertinent events   10/6 admitted for bilateral PE and likely new onset congestive heart failure 10/7 worsening shock state resulting in PCCM consulted and transferred to ICU 10/8 HF consulted for cardiogenic shock, cardiac cath 10/9 remains on pressors   Interim History / Subjective:   Making urine.  Remains in shock on pressors. Cardiac index and CO numbers better this morning. Discussed with nursing at bedside.   Objective   Blood pressure 96/61, pulse (!) 106, temperature 98.1 F (36.7 C), resp. rate 17, height '6\' 1"'  (1.854 m), weight 89.5 kg, SpO2 98 %. PAP: (39-49)/(20-30) 47/26 CVP:  [7 mmHg-16 mmHg] 13 mmHg PCWP:  [25 mmHg-26 mmHg] 25 mmHg CO:  [3 L/min-6 L/min] 6 L/min CI:  [1.4 L/min/m2-2.8 L/min/m2] 2.8 L/min/m2      Intake/Output Summary (Last 24 hours) at 12/19/2020 0840 Last data filed at 12/19/2020 0800 Gross per 24 hour  Intake 2427.28 ml  Output 4700 ml  Net -2272.72 ml   Filed Weights   12/15/20 1653 12/17/20 1536  Weight: 92.1 kg 89.5 kg   Physical Exam: General: male, sitting up, remains in shock on pressors HENT: NCAT, tracking  Respiratory: diminished breaths sounds BL  Cardiovascular: RRR, s1 s2  GI: BS+ Soft NT ND  Extremities: no edema, warm  Neuro: AAOX3   Resolved Hospital Problem list     Assessment & Plan:   Cardiogenic shock (EF <20%) with biventricular failure with multisystem organ failure P: ICU level care MAP >43mHg  Diuresis while on pressor support  Weaning NEPI Follow I&O Follow COOX  Acute bilateral PE with bilateral DVTs Acute hypoxemic respiratory failure -CTA chest with moderate amount of bilateral pulmonary embolism, small right pleural effusion and moderate severity cardiomegaly. -PESI score 101 -Doppler positive for DVT right posterior tibial and DVT left peroneal veins. P: Heparin ggt  No indication for alternative intervention at this time   Acute Kidney Injury superimposed on CKD stage 3a - improving -in the setting of shock as above, Creatinine on admit 1.82 since uptrended to  2.66 with minimal urine output  Acute anion gap metabolic acidosis  P: Follow UOP and renal function   Shock liver - improving -LFT elevated on admit with significant increase 10/7 AST 1,377, ALT 1,248, Alk phos 37, total billirubin  4.7 Coagulopathy  -INR 2.0, PTT 23.0 P: Related to hf  I suspect ill improve slowly   Hyponatremia secondary to volume overload- stable P: Follow BMET    Best Practice   Diet/type: Regular consistency (see orders) DVT prophylaxis: systemic heparin GI prophylaxis: PPI Lines: N/A Foley:  N/A Code Status:  full code Last date of multidisciplinary goals of care discussion: spoke with patient directly this AM    Labs   Improving WBC (normalized), LFTs, AKI. Mild hyponatremia  CBC: Recent Labs  Lab 12/15/20 1716 12/16/20 0634 12/17/20 0457 12/17/20 1423 12/18/20 0330 12/19/20 0500  WBC 7.8 9.2 11.1*  --  6.3 4.9  NEUTROABS 5.2  --   --   --   --   --   HGB 15.5 15.0 14.5 12.9*  13.9 13.4 12.9*  HCT 45.1 43.8 41.8 38.0*  41.0 38.7* 36.3*  MCV 85.6 84.4 83.4  --  83.0 81.9  PLT 168 163 165  --  137* 110*    Basic Metabolic Panel: Recent Labs  Lab 12/15/20 1716 12/16/20 0634 12/17/20 0457 12/17/20 1423 12/18/20 0330 12/19/20 0500  NA 140 135 134* 139  135 133* 128*  K 4.7 4.7 4.7 3.6  4.0 3.6 3.5  CL 107 104 100  --  99 94*  CO2 21* 19* 18*  --  23 26  GLUCOSE 176* 161* 151*  --  158* 144*  BUN 20 32* 49*  --  36* 21*  CREATININE 1.82* 1.83* 2.66*  --  1.77* 1.44*  CALCIUM 9.5 8.9 8.7*  --  8.2* 8.0*  MG  --  2.2 2.3  --  2.0 1.8   GFR: Estimated Creatinine Clearance: 68.6 mL/min (A) (by C-G formula based on SCr of 1.44 mg/dL (H)). Recent Labs  Lab 12/16/20 0634 12/17/20 0457 12/17/20 1115 12/17/20 1122 12/17/20 1235 12/18/20 0330 12/19/20 0500  PROCALCITON  --   --   --  1.22  --  1.27 0.95  WBC 9.2 11.1*  --   --   --  6.3 4.9  LATICACIDVEN  --   --  2.7*  --  1.8  --   --     Liver Function Tests: Recent Labs  Lab 12/15/20 1716 12/16/20 0634 12/17/20 0457 12/18/20 0330 12/19/20 0500  AST 126* 404* 1,377* 678* 603*  ALT 202* 443* 1,248* 1,118* 1,084*  ALKPHOS 38 36* 37* 36* 36*  BILITOT 4.2* 4.0* 4.7* 4.2* 3.1*  PROT 6.3* 6.0* 6.0*  5.5* 5.3*  ALBUMIN 3.6 3.7 3.6 3.2* 3.0*   Recent Labs  Lab 12/15/20 1716  LIPASE 73*   No results for input(s): AMMONIA in the last 168 hours.   This patient is critically ill with multiple organ system failure; which, requires frequent high complexity decision making, assessment, support, evaluation, and titration of therapies. This was completed through the application of advanced monitoring technologies and extensive interpretation of multiple databases. During this encounter critical care time was devoted to patient care services described in this note for 32 minutes.  Garner Nash, DO [facility] 12/19/2020 8:41 AM

## 2020-12-19 NOTE — Progress Notes (Addendum)
Pt CI noted to be trending down, with most recent being 1.3. Dr. Gala Romney notified. Co ox ordered, result of 45 reported to MD. Md updated on milrinone and norepinephrine drip rates, urinary output, and decrease PAD from 20s to low single digits, and most recent CVP. MD gave verbal order to d/c lasix and for pt to receive 1 20ml bottle of albumin, second bottle may be given after if no improvement in pt condition.

## 2020-12-19 NOTE — Progress Notes (Signed)
Advanced Heart Failure Rounding Note   Subjective:    Now on milrinone 0.375 and NE 9. Co-ox improved 56%-> 63%  Feels better. Less ab pain. No CP, SOB, orthopnea or PND.  Continues to diurese on lasix 40 IV bid.  Scr and LFTs continue to improve   Swan numbers done personally with RNs RA = 13 PA = 47/26 (35) PCW = 25 Thermo CO/CI = 6.0/2.8  SVR 733 PVR 1.8 WU   Objective:   Weight Range:  Vital Signs:   Temp:  [97.5 F (36.4 C)-98.8 F (37.1 C)] 98.4 F (36.9 C) (10/09 0900) Pulse Rate:  [98-121] 109 (10/09 0900) Resp:  [8-23] 17 (10/09 0900) BP: (86-101)/(61-83) 99/73 (10/09 0900) SpO2:  [86 %-98 %] 93 % (10/09 0900) Arterial Line BP: (81-101)/(55-71) 92/68 (10/09 0900)    Weight change: Filed Weights   12/15/20 1653 12/17/20 1536  Weight: 92.1 kg 89.5 kg    Intake/Output:   Intake/Output Summary (Last 24 hours) at 12/19/2020 0937 Last data filed at 12/19/2020 0900 Gross per 24 hour  Intake 2165.04 ml  Output 4300 ml  Net -2134.96 ml      Physical Exam: General:  Sitting up in chair No resp difficulty HEENT: normal Neck: supple. RIJ swan. Carotids 2+ bilat; no bruits. No lymphadenopathy or thryomegaly appreciated. Cor: PMI nondisplaced. Tachy regular +s3 Lungs: clear Abdomen: soft, nontender, nondistended. No hepatosplenomegaly. No bruits or masses. Good bowel sounds. Extremities: no cyanosis, clubbing, rash, edema Neuro: alert & orientedx3, cranial nerves grossly intact. moves all 4 extremities w/o difficulty. Affect pleasant  Telemetry: Sinus tach 100-115 Personally reviewed  Labs: Basic Metabolic Panel: Recent Labs  Lab 12/15/20 1716 12/16/20 0634 12/17/20 0457 12/17/20 1423 12/18/20 0330 12/19/20 0500  NA 140 135 134* 139  135 133* 128*  K 4.7 4.7 4.7 3.6  4.0 3.6 3.5  CL 107 104 100  --  99 94*  CO2 21* 19* 18*  --  23 26  GLUCOSE 176* 161* 151*  --  158* 144*  BUN 20 32* 49*  --  36* 21*  CREATININE 1.82* 1.83* 2.66*  --   1.77* 1.44*  CALCIUM 9.5 8.9 8.7*  --  8.2* 8.0*  MG  --  2.2 2.3  --  2.0 1.8     Liver Function Tests: Recent Labs  Lab 12/15/20 1716 12/16/20 0634 12/17/20 0457 12/18/20 0330 12/19/20 0500  AST 126* 404* 1,377* 678* 603*  ALT 202* 443* 1,248* 1,118* 1,084*  ALKPHOS 38 36* 37* 36* 36*  BILITOT 4.2* 4.0* 4.7* 4.2* 3.1*  PROT 6.3* 6.0* 6.0* 5.5* 5.3*  ALBUMIN 3.6 3.7 3.6 3.2* 3.0*    Recent Labs  Lab 12/15/20 1716  LIPASE 73*    No results for input(s): AMMONIA in the last 168 hours.  CBC: Recent Labs  Lab 12/15/20 1716 12/16/20 0634 12/17/20 0457 12/17/20 1423 12/18/20 0330 12/19/20 0500  WBC 7.8 9.2 11.1*  --  6.3 4.9  NEUTROABS 5.2  --   --   --   --   --   HGB 15.5 15.0 14.5 12.9*  13.9 13.4 12.9*  HCT 45.1 43.8 41.8 38.0*  41.0 38.7* 36.3*  MCV 85.6 84.4 83.4  --  83.0 81.9  PLT 168 163 165  --  137* 110*     Cardiac Enzymes: Recent Labs  Lab 12/17/20 1122  CKTOTAL 149  CKMB 3.2     BNP: BNP (last 3 results) Recent Labs    12/15/20 1716  12/17/20 0457  BNP 2,024.7* 1,742.2*     ProBNP (last 3 results) No results for input(s): PROBNP in the last 8760 hours.    Other results:  Imaging: CARDIAC CATHETERIZATION  Result Date: 12/17/2020 Findings: RA = 14 RV = 48/18 PA = 51/23 (36) PCW = 29 Fick cardiac output/index = 4.5/2.11 Thermo CO/Ci = 4.6/2.2 PVR = 1.5 SVR = 1120 FA sat = 95% PA sat = 63%, 69% Assessment: 1. Elevated biventricular pressures with moderately reduced output Plan/Discussion: He is at high-risk for decompensation. Admit to ICU. Follow closely. Arvilla Meres, MD 5:11 PM  US Abdomen Limited  Result Date: 12/17/2020 CLINICAL DATA:  A 50 year old male presents with liver failure. EXAM: ULTRASOUND ABDOMEN LIMITED RIGHT UPPER QUADRANT COMPARISON:  Comparison made with abdominal CT which was performed on December 16, 2020. FINDINGS: Gallbladder: Large gallstone measuring 2.2 cm surrounded by sludge in the gallbladder. No  reported tenderness over the gallbladder. Mild wall thickening. Small amount of pericholecystic fluid. Common bile duct: Diameter: 5.8 mm Liver: Increased hepatic echogenicity without visible lesion. Portal vein is patent on color Doppler imaging with normal direction of blood flow towards the liver. Other: None. IMPRESSION: Gallbladder wall thickening with cholelithiasis, no reported tenderness over the gallbladder and small amount of pericholecystic fluid. Findings are nonspecific, more likely related to congestive hepatopathy. If there is clinical concern for acute cholecystitis consider HIDA scan for further evaluation for added specificity. Sludge also in the gallbladder lumen. Hepatic steatosis. Electronically Signed   By: Donzetta Kohut M.D.   On: 12/17/2020 13:43   DG CHEST PORT 1 VIEW  Result Date: 12/17/2020 CLINICAL DATA:  Pulmonary arterial hypertension, central chest pain radiating to both sides, history CHF, pulmonary embolism, former smoker EXAM: PORTABLE CHEST 1 VIEW COMPARISON:  Portable exam 1740 hours compared to 1257 hours FINDINGS: RIGHT jugular Swan-Ganz catheter with tip projecting over proximal RIGHT pulmonary artery. LEFT jugular line with tip projecting over SVC. Enlargement of cardiac silhouette with pulmonary vascular congestion. Lungs clear. No infiltrate, pleural effusion, or pneumothorax. IMPRESSION: No pneumothorax following line placement. Enlargement of cardiac silhouette with pulmonary vascular congestion. Electronically Signed   By: Ulyses Southward M.D.   On: 12/17/2020 17:50   DG Chest Port 1 View  Result Date: 12/17/2020 CLINICAL DATA:  Central line placement in a 50 year old male. EXAM: PORTABLE CHEST 1 VIEW COMPARISON:  CT evaluation of December 16, 2020 and prior chest x-ray December 15, 2020. FINDINGS: Since the previous study there is been interval placement of a LEFT IJ central venous line that terminates at the caval to atrial junction. Heart size remains enlarged with  globular appearance stable compared to previous imaging. Lungs are well inflated, no consolidation, visible pneumothorax or sign of pleural effusion. EKG leads project over the chest. No acute skeletal process is noted on limited assessment. IMPRESSION: LEFT IJ central venous line placement with tip at the caval to atrial junction. No pneumothorax. Stable marked cardiomegaly. Electronically Signed   By: Donzetta Kohut M.D.   On: 12/17/2020 13:38   ECHOCARDIOGRAM COMPLETE  Result Date: 12/17/2020    ECHOCARDIOGRAM REPORT   Patient Name:   DAUNDRE BIEL Date of Exam: 12/17/2020 Medical Rec #:  [id number]      Height:       72.0 in Accession #:    [id number]     Weight:       203.0 lb Date of Birth:  1970-12-04       BSA:  2.144 m Patient Age:    50 years       BP:           88/75 mmHg Patient Gender: M              HR:           96 bpm. Exam Location:  Inpatient Procedure: 2D Echo STAT ECHO Indications:    Pulmonary embolism, elevated troponin  History:        Patient has no prior history of Echocardiogram examinations. No                 known history.  Sonographer:    Gertie Fey RDMS, RVT, RDCS Referring Phys: [id number] TIMOTHY S OPYD IMPRESSIONS  1. Left ventricular ejection fraction, by estimation, is <20%. The left ventricle has severely decreased function. The left ventricle demonstrates global hypokinesis. The left ventricular internal cavity size was severely dilated. Left ventricular diastolic parameters are consistent with Grade III diastolic dysfunction (restrictive).  2. Right ventricular systolic function is severely reduced. The right ventricular size is mildly enlarged. There is moderately elevated pulmonary artery systolic pressure. The estimated right ventricular systolic pressure is 48.6 mmHg.  3. Left atrial size was severely dilated.  4. Right atrial size was severely dilated.  5. Functional MR from dilated LV. The mitral valve is normal in structure. Severe mitral valve  regurgitation. No evidence of mitral stenosis.  6. Tricuspid valve regurgitation is moderate.  7. The aortic valve is normal in structure. Aortic valve regurgitation is trivial. No aortic stenosis is present.  8. The inferior vena cava is normal in size with greater than 50% respiratory variability, suggesting right atrial pressure of 3 mmHg. FINDINGS  Left Ventricle: Left ventricular ejection fraction, by estimation, is <20%. The left ventricle has severely decreased function. The left ventricle demonstrates global hypokinesis. The left ventricular internal cavity size was severely dilated. There is no left ventricular hypertrophy. Left ventricular diastolic parameters are consistent with Grade III diastolic dysfunction (restrictive). Right Ventricle: The right ventricular size is mildly enlarged. No increase in right ventricular wall thickness. Right ventricular systolic function is severely reduced. There is moderately elevated pulmonary artery systolic pressure. The tricuspid regurgitant velocity is 2.90 m/s, and with an assumed right atrial pressure of 15 mmHg, the estimated right ventricular systolic pressure is 48.6 mmHg. Left Atrium: Left atrial size was severely dilated. Right Atrium: Right atrial size was severely dilated. Pericardium: There is no evidence of pericardial effusion. Mitral Valve: Functional MR from dilated LV. The mitral valve is normal in structure. Severe mitral valve regurgitation, with posteriorly-directed jet. No evidence of mitral valve stenosis. Tricuspid Valve: The tricuspid valve is normal in structure. Tricuspid valve regurgitation is moderate . No evidence of tricuspid stenosis. Aortic Valve: The aortic valve is normal in structure. Aortic valve regurgitation is trivial. No aortic stenosis is present. Aortic valve mean gradient measures 1.0 mmHg. Aortic valve peak gradient measures 1.7 mmHg. Aortic valve area, by VTI measures 2.94 cm. Pulmonic Valve: The pulmonic valve was normal  in structure. Pulmonic valve regurgitation is mild to moderate. No evidence of pulmonic stenosis. Aorta: The aortic root is normal in size and structure. Venous: The inferior vena cava is normal in size with greater than 50% respiratory variability, suggesting right atrial pressure of 3 mmHg. IAS/Shunts: No atrial level shunt detected by color flow Doppler.  LEFT VENTRICLE PLAX 2D LVIDd:         7.60 cm LVIDs:  6.90 cm LV PW:         0.62 cm LV IVS:        0.60 cm LVOT diam:     2.30 cm LV SV:         26 LV SV Index:   12 LVOT Area:     4.15 cm  LV Volumes (MOD) LV vol d, MOD A4C: 192.0 ml LV vol s, MOD A4C: 151.0 ml LV SV MOD A4C:     192.0 ml RIGHT VENTRICLE RV S prime:     7.20 cm/s TAPSE (M-mode): 1.1 cm LEFT ATRIUM              Index        RIGHT ATRIUM           Index LA diam:        5.35 cm  2.50 cm/m   RA Area:     40.80 cm LA Vol (A2C):   170.0 ml 79.29 ml/m  RA Volume:   183.00 ml 85.35 ml/m LA Vol (A4C):   102.0 ml 47.57 ml/m LA Biplane Vol: 145.0 ml 67.63 ml/m  AORTIC VALVE                    PULMONIC VALVE AV Area (Vmax):    3.17 cm     PR End Diast Vel: 7.62 msec AV Area (Vmean):   2.69 cm AV Area (VTI):     2.94 cm AV Vmax:           65.90 cm/s AV Vmean:          52.200 cm/s AV VTI:            0.088 m AV Peak Grad:      1.7 mmHg AV Mean Grad:      1.0 mmHg LVOT Vmax:         50.30 cm/s LVOT Vmean:        33.800 cm/s LVOT VTI:          0.062 m LVOT/AV VTI ratio: 0.71  AORTA Ao Root diam: 2.70 cm MITRAL VALVE                TRICUSPID VALVE MV Area (PHT): 7.66 cm     TR Peak grad:   33.6 mmHg MV Decel Time: 99 msec      TR Vmax:        290.00 cm/s MR Peak grad: 57.3 mmHg MR Mean grad: 36.5 mmHg     SHUNTS MR Vmax:      378.50 cm/s   Systemic VTI:  0.06 m MR Vmean:     285.0 cm/s    Systemic Diam: 2.30 cm MV E velocity: 111.00 cm/s MV A velocity: 29.10 cm/s MV E/A ratio:  3.81 Donato Schultz MD Electronically signed by Donato Schultz MD Signature Date/Time: 12/17/2020/11:31:02 AM    Final       Medications:     Scheduled Medications:  aspirin EC  81 mg Oral Daily   Chlorhexidine Gluconate Cloth  6 each Topical Daily   digoxin  0.125 mg Oral Daily   furosemide  40 mg Intravenous BID   pantoprazole  40 mg Oral Daily   potassium chloride  40 mEq Oral BID   tamsulosin  0.8 mg Oral QHS    Infusions:  heparin 1,500 Units/hr (12/19/20 0900)   milrinone 0.375 mcg/kg/min (12/19/20 0900)   norepinephrine (LEVOPHED) Adult infusion 9 mcg/min (12/19/20 0900)    PRN Medications:  fentaNYL (SUBLIMAZE) injection, ondansetron **OR** ondansetron (ZOFRAN) IV, traMADol   Assessment/Plan:   1. 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 RV severely reduced - Evidence of MSOF in the setting of shock  - Blood Cx obtained. NGTD - Echo suggests NICM but he reports 1 month h/o severe R arm pain and CP prior to decompensation. Will need cath +/- cMRI next week - Co-ox improved on higher doses of milrinone and NE. Will try to wean gently Keep SBP > 90 MAP > 65 - Renal function improving - Continue IV lasix - Continue digoxin 0.125  - Given social situation advanced therapies would be challenging but if he does not recover with medical therapy, he has extensive support from [facility]. We will work to get him Medicaid to keep transplant option on the table for him. Not VAD candidate with severe RV dysfunction - He is HCV positive. HIV nonreactive - Blood type B+   2. Acute PE  - CTA + bilateral segmental/subsegmental PE likely due to low output - Continue Heparin drip    3. Shock liver - improving with hemodynamic support   4. AKI on CKD 3B - baseline SCr 1.8-2.0 - due to ATN from shock  - SCr 1.4 today - improved with hemodynamic support  5. HCV - will need viral RNA quant   6. 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 [facility] needs - I spoke with his caseworker April Anders [phone]). He was  incarcerated almost a decade ago but not since. He has been homeless for years but recently moved into a hotel in June through the [facility]. 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 No substance use. Always first to volunteer to help other [employer] members move and do other tasks. Has a sister out of state but doesn't her to be contacted until he is improving. [employer] members are very attached to him and said they would drive him back and forth to [facility] for transplant eval,if needed. Other [employer] contact: Brother John: [phone]   CRITICAL CARE Performed by: Arvilla Meres  Total critical care time: 35 minutes  Critical care time was exclusive of separately billable procedures and treating other patients.  Critical care was necessary to treat or prevent imminent or life-threatening deterioration.  Critical care was time spent personally by me (independent of midlevel providers or residents) on the following activities: development of treatment plan with patient and/or surrogate as well as nursing, discussions with consultants, evaluation of patient's response to treatment, examination of patient, obtaining history from patient or surrogate, ordering and performing treatments and interventions, ordering and review of laboratory studies, ordering and review of radiographic studies, pulse oximetry and re-evaluation of patient's condition.    Length of Stay: 3   Arvilla Meres MD 12/19/2020, 9:37 AM  Advanced Heart Failure Team Pager [phone] (M-F; 7a - 4p)  Please contact [facility] for night-coverage after hours (4p -7a ) and weekends on [email or web address]

## 2020-12-19 NOTE — Progress Notes (Addendum)
ANTICOAGULATION CONSULT NOTE - Follow Up Consult  Pharmacy Consult for Heparin Indication: pulmonary embolus  No Known Allergies  Patient Measurements: Height: 6\' 1"  (185.4 cm) Weight: 86.2 kg (190 lb 0.6 oz) IBW/kg (Calculated) : 79.9 Heparin Dosing Weight: 92.1 kg  Vital Signs: Temp: 97.9 F (36.6 C) (10/09 1100) Temp Source: Core (10/09 0400) BP: 91/58 (10/09 1100) Pulse Rate: 107 (10/09 1100)  Labs: Recent Labs    12/17/20 0457 12/17/20 1122 12/17/20 1235 12/17/20 1423 12/18/20 0330 12/19/20 0500  HGB 14.5  --   --  12.9*  13.9 13.4 12.9*  HCT 41.8  --   --  38.0*  41.0 38.7* 36.3*  PLT 165  --   --   --  137* 110*  LABPROT 23.0*  --   --   --   --  17.3*  INR 2.0*  --   --   --   --  1.4*  HEPARINUNFRC 0.39  --   --   --  0.64 0.59  CREATININE 2.66*  --   --   --  1.77* 1.44*  CKTOTAL  --  149  --   --   --   --   CKMB  --  3.2  --   --   --   --   TROPONINIHS  --  35* 40*  --   --   --      Estimated Creatinine Clearance: 68.6 mL/min (A) (by C-G formula based on SCr of 1.44 mg/dL (H)).   Medications:  Infusions:   heparin 1,500 Units/hr (12/19/20 1000)   magnesium sulfate bolus IVPB     milrinone 0.375 mcg/kg/min (12/19/20 1000)   norepinephrine (LEVOPHED) Adult infusion 9 mcg/min (12/19/20 1017)    Assessment: 50 yo male with acute bilateral PE, moderate clot burden, no right heart strain.  Pharmacy consulted to dose heparin drip for PE.  No prior AC noted.   HL 0.59 this morning. H/H, plt stable. LFTs are up due to shock. INR up to 2 > down to 1.4 from liver dysfunction (no Coumadin given). No signs of bleeding per RN, old blood around IJ.  CBC stable, platelet count falling.  Baseline 169 > 110.  Clinical suspicion for HIT low, 4T score ~ 4-5.  Goal of Therapy:  Heparin level 0.3-0.7 units/ml Monitor platelets by anticoagulation protocol: Yes   Plan:  Continue heparin IV infusion 1500 units/hr Daily heparin level and CBC Monitor for signs  of bleeding Will check HIT studies.  44, Reece Leader, BCCP Clinical Pharmacist  12/19/2020 11:01 AM   [facility] pharmacy phone numbers are listed on [email or web address]

## 2020-12-20 ENCOUNTER — Encounter (HOSPITAL_COMMUNITY): Payer: Self-pay | Admitting: Internal Medicine

## 2020-12-20 ENCOUNTER — Telehealth (HOSPITAL_COMMUNITY): Payer: Self-pay | Admitting: Licensed Clinical Social Worker

## 2020-12-20 ENCOUNTER — Inpatient Hospital Stay (HOSPITAL_COMMUNITY): Payer: Medicaid Other

## 2020-12-20 DIAGNOSIS — K7200 Acute and subacute hepatic failure without coma: Secondary | ICD-10-CM

## 2020-12-20 LAB — COOXEMETRY PANEL
Carboxyhemoglobin: 1 % (ref 0.5–1.5)
Carboxyhemoglobin: 1.3 % (ref 0.5–1.5)
Carboxyhemoglobin: 1.2 % (ref 0.5–1.5)
Methemoglobin: 0.8 % (ref 0.0–1.5)
Methemoglobin: 0.8 % (ref 0.0–1.5)
Methemoglobin: 0.8 % (ref 0.0–1.5)
O2 Saturation: 61.2 %
O2 Saturation: 49 %
O2 Saturation: 53.4 %
Total hemoglobin: 12.7 g/dL (ref 12.0–16.0)
Total hemoglobin: 13.5 g/dL (ref 12.0–16.0)
Total hemoglobin: 13.7 g/dL (ref 12.0–16.0)

## 2020-12-20 LAB — COMPREHENSIVE METABOLIC PANEL
ALT: 834 U/L — ABNORMAL HIGH (ref 0–44)
AST: 311 U/L — ABNORMAL HIGH (ref 15–41)
Albumin: 3.2 g/dL — ABNORMAL LOW (ref 3.5–5.0)
Alkaline Phosphatase: 36 U/L — ABNORMAL LOW (ref 38–126)
Anion gap: 10 (ref 5–15)
BUN: 17 mg/dL (ref 6–20)
CO2: 25 mmol/L (ref 22–32)
Calcium: 8.4 mg/dL — ABNORMAL LOW (ref 8.9–10.3)
Chloride: 92 mmol/L — ABNORMAL LOW (ref 98–111)
Creatinine, Ser: 1.18 mg/dL (ref 0.61–1.24)
GFR, Estimated: >60 mL/min (ref >60)
Glucose, Bld: 169 mg/dL — ABNORMAL HIGH (ref 70–99)
Potassium: 3.7 mmol/L (ref 3.5–5.1)
Sodium: 127 mmol/L — ABNORMAL LOW (ref 135–145)
Total Bilirubin: 2.9 mg/dL — ABNORMAL HIGH (ref 0.3–1.2)
Total Protein: 5.6 g/dL — ABNORMAL LOW (ref 6.5–8.1)

## 2020-12-20 LAB — CBC
HCT: 36.2 % — ABNORMAL LOW (ref 39.0–52.0)
Hemoglobin: 12.7 g/dL — ABNORMAL LOW (ref 13.0–17.0)
MCH: 28.7 pg (ref 26.0–34.0)
MCHC: 35.1 g/dL (ref 30.0–36.0)
MCV: 81.7 fL (ref 80.0–100.0)
Platelets: 93 10*3/uL — ABNORMAL LOW (ref 150–400)
RBC: 4.43 MIL/uL (ref 4.22–5.81)
RDW: 13.8 % (ref 11.5–15.5)
WBC: 4.6 10*3/uL (ref 4.0–10.5)
nRBC: 0 % (ref 0.0–0.2)

## 2020-12-20 LAB — HCV RNA QUANT
HCV Quantitative: NOT DETECTED IU/mL (ref >50)
HCV Quantitative: NOT DETECTED IU/mL (ref >50)

## 2020-12-20 LAB — HEPARIN LEVEL (UNFRACTIONATED)
Heparin Unfractionated: 0.87 IU/mL — ABNORMAL HIGH (ref 0.30–0.70)
Heparin Unfractionated: 0.73 IU/mL — ABNORMAL HIGH (ref 0.30–0.70)

## 2020-12-20 LAB — LACTIC ACID, PLASMA: Lactic Acid, Venous: 1.3 mmol/L (ref 0.5–1.9)

## 2020-12-20 LAB — HEPARIN INDUCED PLATELET AB (HIT ANTIBODY): Heparin Induced Plt Ab: 0.137 OD (ref 0.000–0.400)

## 2020-12-20 MED ORDER — POLYETHYLENE GLYCOL 3350 17 G PO PACK
17.0000 g | PACK | Freq: Every day | ORAL | Status: DC
  Administered 2020-12-20 – 2021-01-03 (×10): 17 g via ORAL
  Filled 2020-12-20 (×12): qty 1

## 2020-12-20 MED ORDER — TAMSULOSIN HCL 0.4 MG PO CAPS
0.4000 mg | ORAL_CAPSULE | Freq: Every day | ORAL | Status: DC
  Administered 2020-12-20 – 2021-01-05 (×16): 0.4 mg via ORAL
  Filled 2020-12-20 (×16): qty 1

## 2020-12-20 MED ORDER — SORBITOL 70 % SOLN
30.0000 mL | Freq: Once | Status: AC
  Administered 2020-12-20: 30 mL via ORAL
  Filled 2020-12-20: qty 30

## 2020-12-20 MED ORDER — TAMSULOSIN HCL 0.4 MG PO CAPS: 0.4000 mg | ORAL_CAPSULE | Freq: Every day | ORAL | Status: DC

## 2020-12-20 MED ORDER — BISACODYL 5 MG PO TBEC: 10.0000 mg | DELAYED_RELEASE_TABLET | Freq: Every day | ORAL | Status: DC | PRN

## 2020-12-20 MED ORDER — FUROSEMIDE 10 MG/ML IJ SOLN
80.0000 mg | Freq: Once | INTRAMUSCULAR | Status: AC
  Administered 2020-12-20: 80 mg via INTRAVENOUS
  Filled 2020-12-20: qty 8

## 2020-12-20 NOTE — Plan of Care (Signed)
  Problem: Skin Integrity: Goal: Risk for impaired skin integrity will decrease Outcome: Progressing   

## 2020-12-20 NOTE — TOC Initial Note (Signed)
Transition of Care [facility]) - Initial/Assessment Note    Patient Details  Name: Dylan Chaney MRN: [id number] Date of Birth: 1970/10/13  Transition of Care [facility]) CM/SW Contact:    Elliot Cousin, RN Phone Number: [phone] 12/20/2020, 3:15 PM  Clinical Narrative:                  HF TOC CM spoke to pt at bedside. Currently living in hotels. States he was working prior to admission. Pt does not have insurance and will need assistance with medications. Pt goes to [facility]. His PCP, Lavinia Sharps NP at clinic. Will continue to follow for dc needs.      Expected Discharge Plan: Home w Home Health Services Barriers to Discharge: Continued Medical Work up   Patient Goals and CMS Choice        Expected Discharge Plan and Services Expected Discharge Plan: Home w Home Health Services In-house Referral: Clinical Social Work Discharge Planning Services: CM Consult Post Acute Care Choice: Home Health Living arrangements for the past 2 months: Hotel/Motel                                      Prior Living Arrangements/Services Living arrangements for the past 2 months: Hotel/Motel Lives with:: Self Patient language and need for interpreter reviewed:: Yes        Need for Family Participation in Patient Care: No (Comment) Care giver support system in place?: No (comment)   Criminal Activity/Legal Involvement Pertinent to Current Situation/Hospitalization: No - Comment as needed  Activities of Daily Living Home Assistive Devices/Equipment: Eyeglasses ADL Screening (condition at time of admission) Patient's cognitive ability adequate to safely complete daily activities?: Yes Is the patient deaf or have difficulty hearing?: No Does the patient have difficulty seeing, even when wearing glasses/contacts?: No Does the patient have difficulty concentrating, remembering, or making decisions?: No Patient able to express need for assistance with ADLs?:  Yes Does the patient have difficulty dressing or bathing?: Yes (secondary to weakness) Independently performs ADLs?: No Communication: Independent Dressing (OT): Needs assistance Is this a change from baseline?: Change from baseline, expected to last >3 days Grooming: Independent Feeding: Independent Bathing: Needs assistance Is this a change from baseline?: Change from baseline, expected to last >3 days Toileting: Needs assistance Is this a change from baseline?: Change from baseline, expected to last >3days In/Out Bed: Needs assistance Is this a change from baseline?: Change from baseline, expected to last >3 days Walks in Home: Needs assistance Is this a change from baseline?: Change from baseline, expected to last >3 days Does the patient have difficulty walking or climbing stairs?: Yes (secondary to weakness) Weakness of Legs: Both Weakness of Arms/Hands: None  Permission Sought/Granted Permission sought to share information with : Case Manager, PCP, Family Supports Permission granted to share information with : Yes, Verbal Permission Granted  Share Information with NAME: [name]  Permission granted to share info w AGENCY: Case Worker  Permission granted to share info w Relationship: Case Worker  Permission granted to share info w Contact Information: [phone]  Emotional Assessment Appearance:: Appears stated age Attitude/Demeanor/Rapport: Gracious Affect (typically observed): Accepting Orientation: : Oriented to Self, Oriented to Place, Oriented to  Time, Oriented to Situation   Psych Involvement: No (comment)  Admission diagnosis:  Pulmonary embolism (HCC) [I26.99] Acute CHF (congestive heart failure) (HCC) [I50.9] Heart failure, unspecified HF chronicity, unspecified  heart failure type (HCC) [I50.9] Multiple subsegmental pulmonary emboli without acute cor pulmonale (HCC) [I26.94] Patient Active Problem List   Diagnosis Date Noted   Cardiogenic shock (HCC)     CHF (congestive heart failure) (HCC) 12/16/2020   Pulmonary embolism (HCC) 12/16/2020   Elevated LFTs 12/16/2020   Elevated troponin 12/16/2020   Chronic renal insufficiency 12/16/2020   Heart failure (HCC)    PCP:  Lavinia Sharps, NP Pharmacy:   [facility] [place], [place] - [address] [place] [address] Phone: [phone] Fax: [phone]     Social Determinants of Health (SDOH) Interventions Financial Strain Interventions: Artist Housing Interventions: Other (Comment) (Pt. reports living at a [facility] [address] [place]) Transportation Interventions: [employer]  Readmission Risk Interventions No flowsheet data found.

## 2020-12-20 NOTE — Progress Notes (Signed)
ANTICOAGULATION CONSULT NOTE - Follow Up Consult  Pharmacy Consult for Heparin Indication: pulmonary embolus  No Known Allergies  Patient Measurements: Height: 6\' 1"  (185.4 cm) Weight: 86.2 kg (190 lb 0.6 oz) IBW/kg (Calculated) : 79.9 Heparin Dosing Weight: 92.1 kg  Vital Signs: Temp: 98.1 F (36.7 C) (10/10 1545) Temp Source: Core (10/10 1145) BP: 107/80 (10/10 1400) Pulse Rate: 109 (10/10 1530)  Labs: Recent Labs    12/18/20 0330 12/19/20 0500 12/19/20 1942 12/20/20 0428 12/20/20 0749 12/20/20 1548  HGB 13.4 12.9*  --  12.7*  --   --   HCT 38.7* 36.3*  --  36.2*  --   --   PLT 137* 110*  --  93*  --   --   LABPROT  --  17.3*  --   --   --   --   INR  --  1.4*  --   --   --   --   HEPARINUNFRC 0.64 0.59  --  0.87*  --  0.73*  CREATININE 1.77* 1.44* 1.26*  --  1.18  --      Estimated Creatinine Clearance: 83.7 mL/min (by C-G formula based on SCr of 1.18 mg/dL).   Medications:  Infusions:   heparin 1,400 Units/hr (12/20/20 1556)   milrinone 0.375 mcg/kg/min (12/20/20 1807)   norepinephrine (LEVOPHED) Adult infusion 15 mcg/min (12/20/20 1806)    Assessment: 50 yo male with acute bilateral PE, moderate clot burden, no right heart strain.  Pharmacy consulted to dose heparin drip for PE.  No prior AC noted.   HL 0.7 at the top of goal after rate decrease this am  elevated this morning heparin drip rate 1400 uts/hr. Hgb stable, platelets continue to drift down to 93. Discussed with cardiology, low concern for HIT but antibody sent. Will continue heparin for now, LFTs are up due to shock. INR initially up to 2 > now trend down to 1.4 from liver dysfunction (no Coumadin given). No signs of bleeding per RN.   Goal of Therapy:  Heparin level 0.3-0.7 units/ml Monitor platelets by anticoagulation protocol: Yes   Plan:  Reduce IV heparin infusion to 1300 units/hr Daily heparin level and CBC Monitor for signs of bleeding Will check HIT studies.   44  Pharm.D. CPP, BCPS Clinical Pharmacist [phone] 12/20/2020 6:22 PM

## 2020-12-20 NOTE — Progress Notes (Signed)
Advanced Heart Failure Rounding Note   Subjective:    Now on milrinone 0.375 and NE 10.   Co-ox dropped last night. Lasix stopped and given some albumin and co-ox improved. 63% -> 45% -> 61%  Feels ok. Still weak. SOB with minimal. No orthopnea or PND.    Ernestine Conrad numbers done personally with RNs RA = 17 PA = 48/28 (36) PCW = UTW Thermo CO/CI = 3.9/1.8  SVR 1192 PVR 3.4 WU   Objective:   Weight Range:  Vital Signs:   Temp:  [97.3 F (36.3 C)-98.6 F (37 C)] 98.1 F (36.7 C) (10/10 0730) Pulse Rate:  [99-110] 101 (10/10 0730) Resp:  [4-24] 18 (10/10 0730) BP: (79-105)/(53-82) 97/76 (10/10 0500) SpO2:  [92 %-99 %] 96 % (10/10 0730) Arterial Line BP: (81-99)/(56-68) 88/57 (10/10 0730) Weight:  [86.2 kg] 86.2 kg (10/09 1000)    Weight change: Filed Weights   12/15/20 1653 12/17/20 1536 12/19/20 1000  Weight: 92.1 kg 89.5 kg 86.2 kg    Intake/Output:   Intake/Output Summary (Last 24 hours) at 12/20/2020 0811 Last data filed at 12/20/2020 0800 Gross per 24 hour  Intake 1592.77 ml  Output 2700 ml  Net -1107.23 ml      Physical Exam: General:  Sitting up. No resp difficulty HEENT: normal Neck: supple. RIJ swan. JVP to jaw Carotids 2+ bilat; no bruits. No lymphadenopathy or thryomegaly appreciated. Cor: PMI nondisplaced. Regular tachy + s3 Lungs: clear Abdomen: soft, nontender, nondistended. No hepatosplenomegaly. No bruits or masses. Good bowel sounds. Extremities: no cyanosis, clubbing, rash, edema Neuro: alert & orientedx3, cranial nerves grossly intact. moves all 4 extremities w/o difficulty. Affect pleasant   Telemetry: Sinus tach 100-110Personally reviewed  Labs: Basic Metabolic Panel: Recent Labs  Lab 12/16/20 0634 12/17/20 0457 12/17/20 1423 12/18/20 0330 12/19/20 0500 12/19/20 1942  NA 135 134* 139  135 133* 128* 126*  K 4.7 4.7 3.6  4.0 3.6 3.5 3.7  CL 104 100  --  99 94* 91*  CO2 19* 18*  --  23 26 23   GLUCOSE 161* 151*  --  158*  144* 181*  BUN 32* 49*  --  36* 21* 20  CREATININE 1.83* 2.66*  --  1.77* 1.44* 1.26*  CALCIUM 8.9 8.7*  --  8.2* 8.0* 8.2*  MG 2.2 2.3  --  2.0 1.8 2.1     Liver Function Tests: Recent Labs  Lab 12/15/20 1716 12/16/20 0634 12/17/20 0457 12/18/20 0330 12/19/20 0500  AST 126* 404* 1,377* 678* 603*  ALT 202* 443* 1,248* 1,118* 1,084*  ALKPHOS 38 36* 37* 36* 36*  BILITOT 4.2* 4.0* 4.7* 4.2* 3.1*  PROT 6.3* 6.0* 6.0* 5.5* 5.3*  ALBUMIN 3.6 3.7 3.6 3.2* 3.0*    Recent Labs  Lab 12/15/20 1716  LIPASE 73*    No results for input(s): AMMONIA in the last 168 hours.  CBC: Recent Labs  Lab 12/15/20 1716 12/16/20 0634 12/17/20 0457 12/17/20 1423 12/18/20 0330 12/19/20 0500 12/20/20 0428  WBC 7.8 9.2 11.1*  --  6.3 4.9 4.6  NEUTROABS 5.2  --   --   --   --   --   --   HGB 15.5 15.0 14.5 12.9*  13.9 13.4 12.9* 12.7*  HCT 45.1 43.8 41.8 38.0*  41.0 38.7* 36.3* 36.2*  MCV 85.6 84.4 83.4  --  83.0 81.9 81.7  PLT 168 163 165  --  137* 110* 93*     Cardiac Enzymes: Recent Labs  Lab 12/17/20  1122  CKTOTAL 149  CKMB 3.2     BNP: BNP (last 3 results) Recent Labs    12/15/20 1716 12/17/20 0457  BNP 2,024.7* 1,742.2*     ProBNP (last 3 results) No results for input(s): PROBNP in the last 8760 hours.    Other results:  Imaging: No results found.   Medications:     Scheduled Medications:  aspirin EC  81 mg Oral Daily   Chlorhexidine Gluconate Cloth  6 each Topical Daily   digoxin  0.125 mg Oral Daily   docusate sodium  100 mg Oral BID   pantoprazole  40 mg Oral Daily   polyethylene glycol  17 g Oral Daily   potassium chloride  40 mEq Oral BID   tamsulosin  0.4 mg Oral QPC supper    Infusions:  heparin 1,500 Units/hr (12/20/20 0800)   milrinone 0.375 mcg/kg/min (12/20/20 0800)   norepinephrine (LEVOPHED) Adult infusion 10 mcg/min (12/20/20 0800)    PRN Medications: bisacodyl, fentaNYL (SUBLIMAZE) injection, ondansetron **OR** ondansetron  (ZOFRAN) IV, traMADol   Assessment/Plan:   1. 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 RV severely reduced - Evidence of MSOF in the setting of shock  - Blood Cx obtained. NGTD - Echo suggests NICM but he reports 1 month h/o severe R arm pain and CP prior to decompensation. Will need cath +/- cMRI next week - Dropped his outputs overnight. Diuretics held and given albumin. Hemodynamics improved but still extremely tenuous.  - Continue milrinone and NE. Increase NE to 15 - Continue digoxin 0.125  - Given social situation advanced therapies would be challenging but if he does not recover with medical therapy, he has extensive support from [facility]. We will work to get him Medicaid to keep transplant option on the table for him. Not VAD candidate with severe RV dysfunction - He is HCV positive. HIV nonreactive - Blood type B+ - I spoke to Dr. Edwena Blow at [facility]. They would consider him for transplant evaluation (will need Medicaid first). Will plan to proceed with Impella 5.5 in next day or two as bridge. I have d/w TCTS   2. Acute PE  - CTA + bilateral segmental/subsegmental PE likely due to low output - Continue Heparin drip  - PLTs dropping. HIT panel sent. Follow closely    3. Shock liver - improving with hemodynamic support   4. AKI on CKD 3B - baseline SCr 1.8-2.0 - due to ATN from shock  - BMET pending  5. Thrombocytopenia - PLTs dropping. HIT panel sent. Follow closely  - can switch to bival as needed  6. HCV - will need viral RNA quant   7. 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 [facility] needs - I spoke with his caseworker April Anders [phone]). He was incarcerated almost a decade ago but not since. He has been homeless for years but recently moved into a hotel in June through the [facility]. He has been a model participant in their program. 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 No substance use. Always first to volunteer to help other [employer] members move and do other tasks. Has a sister out of state but doesn't her to be contacted until he is improving. [employer] members are very attached to him and said they  would drive him back and forth to [facility] for transplant eval,if needed. Other [employer] contact: Brother John: [phone]   CRITICAL CARE Performed by: Arvilla Meres  Total critical care time: 55 minutes  Critical care time was exclusive of separately billable procedures and treating other patients.  Critical care was necessary to treat or prevent imminent or life-threatening deterioration.  Critical care was time spent personally by me (independent of midlevel providers or residents) on the following activities: development of treatment plan with patient and/or surrogate as well as nursing, discussions with consultants, evaluation of patient's response to treatment, examination of patient, obtaining history from patient or surrogate, ordering and performing treatments and interventions, ordering and review of laboratory studies, ordering and review of radiographic studies, pulse oximetry and re-evaluation of patient's condition.    Length of Stay: 4   Arvilla Meres MD 12/20/2020, 8:11 AM  Advanced Heart Failure Team Pager [phone] (M-F; 7a - 4p)  Please contact [facility] for night-coverage after hours (4p -7a ) and weekends on [email or web address]

## 2020-12-20 NOTE — Progress Notes (Addendum)
NAME:  Dylan Chaney, MRN:  [id number], DOB:  Jul 23, 1970, LOS: 4 ADMISSION DATE:  12/15/2020, CONSULTATION DATE:  12/17/2020 REFERRING MD:  Dr. Ella Jubilee, CHIEF COMPLAINT:  Shock    History of Present Illness:  Dylan Chaney is a 50 y.o. male with a past medical history significant for prostate enlargement, chronic kidney disease, and remote history of alcohol and substance abuse who presented to the emergency room for complaints of chest and back pain with associated shortness of breath.  Patient states he started feeling unwell approximately 8 to 12 weeks ago with major symptom being chest discomfort and abdominal pain with associated nausea and vomiting.  When the chest pain and shortness of breath began and patient decided to present to the emergency department.  On arrival to ED patient was seen hypotensive, tachycardic, tachypneic, and borderline hypoxic.  Given concern for cardiogenic component and/or pulmonary embolism patient underwent CT of chest and abdomen which revealed moderate bilateral pulmonary embolism with severe cardiomegaly.  Patient was admitted per hospitalist team.  By morning but 10/7 patient was seen with worsening shock signs with progressive hypotension, tachycardia, and hypothermia.  L lab work also indicated multisystem organ dysfunction including worsening AKI, and worsening transaminitis indicating shock liver.  PCCM consulted for further management and transfer to ICU  Pertinent  Medical History  Enlarged prostate CKD Remote history of alcohol and substance abuse  Significant Hospital Events: Including procedures, antibiotic start and stop dates in addition to other pertinent events   10/6 admitted for bilateral PE and likely new onset congestive heart failure 10/7 worsening shock state resulting in PCCM consulted and transferred to ICU 10/8 HF consulted for cardiogenic shock, cardiac cath 10/9 remains on pressors   Interim History / Subjective:   No events,  trying to eat breakfast, tough to swallow food so he is taking time. Denies chest pain, hemoptysis, dizziness. Overall feelings of fatigue/malaise continue. On milrinone 0.375, levophed 10, heparin 1500 gtt CI remain low  Objective   Blood pressure 97/76, pulse 99, temperature (!) 97.5 F (36.4 C), resp. rate 11, height 6\' 1"  (1.854 m), weight 86.2 kg, SpO2 94 %. PAP: (39-51)/(3-32) 48/7 CVP:  [6 mmHg-15 mmHg] 12 mmHg PCWP:  [25 mmHg] 25 mmHg CO:  [2.7 L/min-6 L/min] 3.2 L/min CI:  [1.3 L/min/m2-2.8 L/min/m2] 1.5 L/min/m2      Intake/Output Summary (Last 24 hours) at 12/20/2020 0704 Last data filed at 12/20/2020 0600 Gross per 24 hour  Intake 1527.01 ml  Output 2950 ml  Net -1422.99 ml    Filed Weights   12/15/20 1653 12/17/20 1536 12/19/20 1000  Weight: 92.1 kg 89.5 kg 86.2 kg   Physical Exam: No distress lying in bed Heart sounds tachycardic, ext lukewarm Hypoactive BS, mildly distended Multiple central lines in place CDI Moves all 4 ext to command No sign edema  Coox 61% (stable). Plts dropping  Resolved Hospital Problem list     Assessment & Plan:   Cardiogenic shock (EF <20%) with biventricular failure with multisystem organ failure Acute bilateral PE with bilateral DVTs- a little unclear chicken-egg with above in terms of pathophysiology Acute hypoxemic respiratory failure Worsening thrombocytopenia- 4T score 3 (intermediate); PF4 pending, may need to consider switching to bival but will d/w heart failure team Constipation Shock liver Hyponatremia- related to deranged ADH axis in setting of low output state Cardiorenal syndrome- improved - Drop flomax in half - Inotropes and diuresis per CHF team - Levophed titrated to MAP 65 - Continue heparin gtt - Check CMP,  avoid nephrotoxins - Strengthen bowel regimen, check KUB  Overall hope this is stunning and CI will improve with euvolemia and anticoagulation.  Best Practice   Diet/type: Regular consistency  (see orders) DVT prophylaxis: systemic heparin GI prophylaxis: PPI Lines: N/A Foley:  N/A Code Status:  full code Last date of multidisciplinary goals of care discussion: per primary  Patient critically ill due to shock Interventions to address this today pressor titration Risk of deterioration without these interventions is high  I personally spent 34 minutes providing critical care not including any separately billable procedures  Myrla Halsted MD [facility]  Prefer epic messenger for cross cover needs If after hours, please call E-link

## 2020-12-20 NOTE — Progress Notes (Signed)
ANTICOAGULATION CONSULT NOTE - Follow Up Consult  Pharmacy Consult for Heparin Indication: pulmonary embolus  No Known Allergies  Patient Measurements: Height: 6\' 1"  (185.4 cm) Weight: 86.2 kg (190 lb 0.6 oz) IBW/kg (Calculated) : 79.9 Heparin Dosing Weight: 92.1 kg  Vital Signs: Temp: 98.1 F (36.7 C) (10/10 0730) Temp Source: Core (10/10 0800) BP: 97/76 (10/10 0500) Pulse Rate: 101 (10/10 0730)  Labs: Recent Labs    12/17/20 1122 12/17/20 1235 12/17/20 1423 12/18/20 0330 12/19/20 0500 12/19/20 1942 12/20/20 0428  HGB  --   --    < > 13.4 12.9*  --  12.7*  HCT  --   --    < > 38.7* 36.3*  --  36.2*  PLT  --   --   --  137* 110*  --  93*  LABPROT  --   --   --   --  17.3*  --   --   INR  --   --   --   --  1.4*  --   --   HEPARINUNFRC  --   --   --  0.64 0.59  --  0.87*  CREATININE  --   --   --  1.77* 1.44* 1.26*  --   CKTOTAL 149  --   --   --   --   --   --   CKMB 3.2  --   --   --   --   --   --   TROPONINIHS 35* 40*  --   --   --   --   --    < > = values in this interval not displayed.     Estimated Creatinine Clearance: 78.4 mL/min (A) (by C-G formula based on SCr of 1.26 mg/dL (H)).   Medications:  Infusions:   heparin 1,500 Units/hr (12/20/20 0800)   milrinone 0.375 mcg/kg/min (12/20/20 0800)   norepinephrine (LEVOPHED) Adult infusion 10 mcg/min (12/20/20 0800)    Assessment: 50 yo male with acute bilateral PE, moderate clot burden, no right heart strain.  Pharmacy consulted to dose heparin drip for PE.  No prior AC noted.   HL 0.87 elevated this morning. Hgb stable, platelets continue to drift down to 93. Discussed with cardiology, low concern for HIT but antibody sent. Will continue heparin for now, LFTs are up due to shock. INR up to 2 > down to 1.4 from liver dysfunction (no Coumadin given). No signs of bleeding per RN.  CBC stable, platelet count falling.  Baseline 169 > 110.  Clinical suspicion for HIT low, 4T score ~ 4-5.  Goal of Therapy:   Heparin level 0.3-0.7 units/ml Monitor platelets by anticoagulation protocol: Yes   Plan:  Reduce IV heparin infusion to 1400 units/hr Recheck level this afternoon Daily heparin level and CBC Monitor for signs of bleeding Will check HIT studies.  44 PharmD., BCPS Clinical Pharmacist 12/20/2020 8:19 AM

## 2020-12-20 NOTE — Progress Notes (Addendum)
  He continues to feel poorly. More SOB.   Now on NE 15 and milrinone 0.375. Co-ox 49%   CVP 15-17. Urine dark.   I have discussed with [facility] and plan will be for possible transplant eval once he has Medicaid.  Will plan mechanical support tomorrow with Impella 5.5 as bridge. If surgery team unavailable (or he decompensates sooner) will plan IABP first.   Additional CCT 35 mins.   Arvilla Meres, MD  6:58 PM   Addendum:  D/w Dr. Dorris Fetch. Will plan Impella 5.5 first case in am.   Arvilla Meres, MD  7:00 PM

## 2020-12-20 NOTE — Telephone Encounter (Signed)
CSW contacted Flonnie Hailstone at [employer] for assistance with medicaid screen for patient who is admitted to [facility]. Kenda referred case to Eustace Moore to complete medicaid screen. CSW will follow for support and follow up with medicaid needs. Lasandra Beech, LCSW, CCSW-MCS [phone]

## 2020-12-20 NOTE — TOC Progression Note (Addendum)
Transition of Care [facility]) - Progression Note    Patient Details  Name: Dylan Chaney MRN: [id number] Date of Birth: 1971/02/01  [facility] CM/SW Contact  Dylan Chaney, LCSWA Phone Number: 12/20/2020, 2:48 PM  Clinical Narrative:    HF CSW and RNCM spoke with Dylan Chaney at bedside and CSW completed a very brief SDOH with the patient who reported that he is living at the [facility] and works sometimes and does have some help through the [facility] and uses the bus for transportation. CSW discussed [facility] [employer] and Dylan Chaney reported that would be helpful for him and is agreeable for the CSW to enroll him in the service. Dylan Chaney doesn't have health insurance and is in need of health insurance for potential heart transplant. Dylan Chaney gave permission for CSW to contact his case worker Dylan Chaney [phone]. CSW contacted Dylan at [phone] with the [facility] and she provided information about Dylan Chaney and that he has been self-sufficient working at [employer] full-time and that he paid rent at [employer] already and they should be keeping his things there although she isn't sure for how long he is paid up till. Dylan reported that Dylan Chaney has been seeing the NP at the [facility] who works with [facility] and that he has an [employer] for getting his medications. CSW shared contact information with Dylan to coordinate support and resources as needed for Dylan Chaney.  CSW will continue to follow throughout discharge.     Barriers to Discharge: Continued Medical Work up, Inadequate or no insurance  Expected Discharge Plan and Services   In-house Referral: Clinical Social Work     Living arrangements for the past 2 months: Hotel/Motel                                       Social Determinants of Health (SDOH) Interventions Financial Strain Interventions: Artist Housing Interventions: Other (Comment) (Pt. reports living at a [facility] [address] [place]) Transportation Interventions: [employer]  Readmission Risk Interventions No flowsheet data found.  Dylan Chaney, MSW, LCSWA [phone] Heart Failure Social Worker

## 2020-12-21 ENCOUNTER — Inpatient Hospital Stay (HOSPITAL_COMMUNITY): Payer: Medicaid Other | Admitting: Certified Registered Nurse Anesthetist

## 2020-12-21 ENCOUNTER — Encounter (HOSPITAL_COMMUNITY)
Admission: EM | Disposition: A | Discharge status: Home Health | Payer: Self-pay | Source: Home / Self Care | Attending: Internal Medicine

## 2020-12-21 ENCOUNTER — Inpatient Hospital Stay (HOSPITAL_COMMUNITY): Payer: Medicaid Other

## 2020-12-21 DIAGNOSIS — I5043 Acute on chronic combined systolic (congestive) and diastolic (congestive) heart failure: Secondary | ICD-10-CM

## 2020-12-21 DIAGNOSIS — R570 Cardiogenic shock: Secondary | ICD-10-CM

## 2020-12-21 DIAGNOSIS — N183 Chronic kidney disease, stage 3 unspecified: Secondary | ICD-10-CM

## 2020-12-21 HISTORY — PX: PLACEMENT OF IMPELLA LEFT VENTRICULAR ASSIST DEVICE: SHX6519

## 2020-12-21 LAB — CBC
HCT: 37.8 % — ABNORMAL LOW (ref 39.0–52.0)
HCT: 34.8 % — ABNORMAL LOW (ref 39.0–52.0)
Hemoglobin: 12.9 g/dL — ABNORMAL LOW (ref 13.0–17.0)
Hemoglobin: 12.3 g/dL — ABNORMAL LOW (ref 13.0–17.0)
MCH: 28.1 pg (ref 26.0–34.0)
MCH: 28.9 pg (ref 26.0–34.0)
MCHC: 34.1 g/dL (ref 30.0–36.0)
MCHC: 35.3 g/dL (ref 30.0–36.0)
MCV: 82.4 fL (ref 80.0–100.0)
MCV: 81.7 fL (ref 80.0–100.0)
Platelets: 84 10*3/uL — ABNORMAL LOW (ref 150–400)
Platelets: 90 10*3/uL — ABNORMAL LOW (ref 150–400)
RBC: 4.59 MIL/uL (ref 4.22–5.81)
RBC: 4.26 MIL/uL (ref 4.22–5.81)
RDW: 13.6 % (ref 11.5–15.5)
RDW: 13.6 % (ref 11.5–15.5)
WBC: 4.7 10*3/uL (ref 4.0–10.5)
WBC: 7.1 10*3/uL (ref 4.0–10.5)
nRBC: 0 % (ref 0.0–0.2)
nRBC: 0 % (ref 0.0–0.2)

## 2020-12-21 LAB — BASIC METABOLIC PANEL
Anion gap: 11 (ref 5–15)
BUN: 15 mg/dL (ref 6–20)
CO2: 23 mmol/L (ref 22–32)
Calcium: 8.5 mg/dL — ABNORMAL LOW (ref 8.9–10.3)
Chloride: 92 mmol/L — ABNORMAL LOW (ref 98–111)
Creatinine, Ser: 1.12 mg/dL (ref 0.61–1.24)
GFR, Estimated: >60 mL/min (ref >60)
Glucose, Bld: 173 mg/dL — ABNORMAL HIGH (ref 70–99)
Potassium: 4.3 mmol/L (ref 3.5–5.1)
Sodium: 126 mmol/L — ABNORMAL LOW (ref 135–145)

## 2020-12-21 LAB — COOXEMETRY PANEL
Carboxyhemoglobin: 1 % (ref 0.5–1.5)
Methemoglobin: 0.8 % (ref 0.0–1.5)
O2 Saturation: 54.9 %
Total hemoglobin: 13.4 g/dL (ref 12.0–16.0)

## 2020-12-21 LAB — POCT I-STAT, CHEM 8
BUN: 15 mg/dL (ref 6–20)
Calcium, Ion: 1.14 mmol/L — ABNORMAL LOW (ref 1.15–1.40)
Chloride: 91 mmol/L — ABNORMAL LOW (ref 98–111)
Creatinine, Ser: 1.1 mg/dL (ref 0.61–1.24)
Glucose, Bld: 217 mg/dL — ABNORMAL HIGH (ref 70–99)
HCT: 39 % (ref 39.0–52.0)
Hemoglobin: 13.3 g/dL (ref 13.0–17.0)
Potassium: 4.4 mmol/L (ref 3.5–5.1)
Sodium: 126 mmol/L — ABNORMAL LOW (ref 135–145)
TCO2: 27 mmol/L (ref 22–32)

## 2020-12-21 LAB — POCT ACTIVATED CLOTTING TIME: Activated Clotting Time: 237 seconds

## 2020-12-21 LAB — FIBRINOGEN: Fibrinogen: 425 mg/dL (ref 210–475)

## 2020-12-21 LAB — HEPARIN LEVEL (UNFRACTIONATED)
Heparin Unfractionated: 0.38 IU/mL (ref 0.30–0.70)
Heparin Unfractionated: >1.1 IU/mL — ABNORMAL HIGH (ref 0.30–0.70)

## 2020-12-21 LAB — LACTATE DEHYDROGENASE: LDH: 243 U/L — ABNORMAL HIGH (ref 98–192)

## 2020-12-21 SURGERY — INSERTION, CARDIAC ASSIST DEVICE, IMPELLA
Anesthesia: General

## 2020-12-21 MED ORDER — PHENYLEPHRINE HCL (PRESSORS) 10 MG/ML IV SOLN
INTRAVENOUS | Status: DC | PRN
  Administered 2020-12-21: 80 ug via INTRAVENOUS
  Administered 2020-12-21 (×2): 40 ug via INTRAVENOUS
  Administered 2020-12-21 (×3): 80 ug via INTRAVENOUS

## 2020-12-21 MED ORDER — CEFAZOLIN SODIUM-DEXTROSE 2-3 GM-%(50ML) IV SOLR
INTRAVENOUS | Status: DC | PRN
  Administered 2020-12-21: 2 g via INTRAVENOUS

## 2020-12-21 MED ORDER — HEPARIN SODIUM (PORCINE) 1000 UNIT/ML IJ SOLN
INTRAMUSCULAR | Status: DC | PRN
  Administered 2020-12-21: 10000 [IU] via INTRAVENOUS

## 2020-12-21 MED ORDER — BLISTEX MEDICATED EX OINT
TOPICAL_OINTMENT | CUTANEOUS | Status: DC | PRN
  Filled 2020-12-21: qty 6.3

## 2020-12-21 MED ORDER — LACTATED RINGERS IV SOLN: INTRAVENOUS | Status: DC | PRN

## 2020-12-21 MED ORDER — MIDAZOLAM HCL 5 MG/5ML IJ SOLN
INTRAMUSCULAR | Status: DC | PRN
  Administered 2020-12-21: 1 mg via INTRAVENOUS

## 2020-12-21 MED ORDER — SODIUM CHLORIDE 0.9 % IV SOLN
INTRAVENOUS | Status: DC
  Administered 2020-12-27: 1000 mL via INTRAVENOUS

## 2020-12-21 MED ORDER — ALBUMIN HUMAN 25 % IV SOLN
12.5000 g | Freq: Once | INTRAVENOUS | Status: AC
  Administered 2020-12-21: 12.5 g via INTRAVENOUS
  Filled 2020-12-21: qty 50

## 2020-12-21 MED ORDER — HEPARIN SODIUM (PORCINE) 5000 UNIT/ML IJ SOLN
INTRAVENOUS | Status: DC
  Filled 2020-12-21 (×12): qty 10

## 2020-12-21 MED ORDER — FENTANYL CITRATE (PF) 250 MCG/5ML IJ SOLN
INTRAMUSCULAR | Status: DC | PRN
  Administered 2020-12-21: 200 ug via INTRAVENOUS

## 2020-12-21 MED ORDER — PROPOFOL 10 MG/ML IV BOLUS
INTRAVENOUS | Status: AC
  Filled 2020-12-21: qty 20

## 2020-12-21 MED ORDER — ROCURONIUM BROMIDE 10 MG/ML (PF) SYRINGE
PREFILLED_SYRINGE | INTRAVENOUS | Status: DC | PRN
  Administered 2020-12-21: 60 mg via INTRAVENOUS
  Administered 2020-12-21: 10 mg via INTRAVENOUS
  Administered 2020-12-21: 20 mg via INTRAVENOUS

## 2020-12-21 MED ORDER — HEPARIN 6000 UNIT IRRIGATION SOLUTION
Status: AC
  Filled 2020-12-21: qty 500

## 2020-12-21 MED ORDER — NON FORMULARY
Status: DC | PRN
  Administered 2020-12-21: 1000 mL

## 2020-12-21 MED ORDER — 0.9 % SODIUM CHLORIDE (POUR BTL) OPTIME
TOPICAL | Status: DC | PRN
  Administered 2020-12-21: 3000 mL

## 2020-12-21 MED ORDER — SUGAMMADEX SODIUM 200 MG/2ML IV SOLN
INTRAVENOUS | Status: DC | PRN
  Administered 2020-12-21: 200 mg via INTRAVENOUS

## 2020-12-21 MED ORDER — HEPARIN SODIUM (PORCINE) 5000 UNIT/ML IJ SOLN: INTRAVENOUS | Status: DC

## 2020-12-21 MED ORDER — ORAL CARE MOUTH RINSE
15.0000 mL | Freq: Two times a day (BID) | OROMUCOSAL | Status: DC
  Administered 2020-12-22 – 2020-12-27 (×10): 15 mL via OROMUCOSAL

## 2020-12-21 MED ORDER — ONDANSETRON HCL 4 MG/2ML IJ SOLN
INTRAMUSCULAR | Status: DC | PRN
  Administered 2020-12-21: 4 mg via INTRAVENOUS

## 2020-12-21 MED ORDER — HEMOSTATIC AGENTS (NO CHARGE) OPTIME
TOPICAL | Status: DC | PRN
  Administered 2020-12-21 (×2): 1 via TOPICAL

## 2020-12-21 MED ORDER — SODIUM CHLORIDE 0.9% IV SOLUTION: INTRAVENOUS | Status: DC | PRN

## 2020-12-21 MED ORDER — VANCOMYCIN HCL 1000 MG IV SOLR
INTRAVENOUS | Status: DC | PRN
  Administered 2020-12-21: 1000 mg via INTRAVENOUS

## 2020-12-21 MED ORDER — MIDAZOLAM HCL 2 MG/2ML IJ SOLN
INTRAMUSCULAR | Status: AC
  Filled 2020-12-21: qty 2

## 2020-12-21 MED ORDER — SODIUM CHLORIDE 0.9 % IV BOLUS
250.0000 mL | Freq: Once | INTRAVENOUS | Status: AC
  Administered 2020-12-21: 250 mL via INTRAVENOUS

## 2020-12-21 MED ORDER — FENTANYL CITRATE (PF) 250 MCG/5ML IJ SOLN
INTRAMUSCULAR | Status: AC
  Filled 2020-12-21: qty 5

## 2020-12-21 MED ORDER — VANCOMYCIN HCL IN DEXTROSE 1-5 GM/200ML-% IV SOLN
1000.0000 mg | INTRAVENOUS | Status: DC
  Filled 2020-12-21: qty 200

## 2020-12-21 MED ORDER — FENTANYL CITRATE PF 50 MCG/ML IJ SOSY
50.0000 ug | PREFILLED_SYRINGE | INTRAMUSCULAR | Status: DC | PRN
  Administered 2020-12-21 – 2020-12-22 (×6): 50 ug via INTRAVENOUS
  Filled 2020-12-21 (×6): qty 1

## 2020-12-21 MED ORDER — HEPARIN 6000 UNIT IRRIGATION SOLUTION
Status: DC | PRN
  Administered 2020-12-21: 1

## 2020-12-21 MED ORDER — ETOMIDATE 2 MG/ML IV SOLN
INTRAVENOUS | Status: DC | PRN
  Administered 2020-12-21: 20 mg via INTRAVENOUS

## 2020-12-21 MED FILL — Heparin Sod (Porcine)-NaCl IV Soln 1000 Unit/500ML-0.9%: INTRAVENOUS | Qty: 500 | Status: AC

## 2020-12-21 SURGICAL SUPPLY — 64 items
ANCHOR CATH FOLEY SECURE (MISCELLANEOUS) ×6 IMPLANT
APL SRG 7X2 LUM MLBL SLNT (VASCULAR PRODUCTS)
APPLICATOR TIP COSEAL (VASCULAR PRODUCTS) IMPLANT
BLADE CLIPPER SURG (BLADE) ×2 IMPLANT
BLADE SURG 11 STRL SS (BLADE) ×2 IMPLANT
CATH ACCU-VU SIZ PIG 5F 100CM (CATHETERS) ×2 IMPLANT
CATH DIAG 6FR PIGTAIL (CATHETERS) ×2 IMPLANT
CATH DIAG EXPO 6F AL1 (CATHETERS) ×2 IMPLANT
CATH INFINITI 6F MPB2 (CATHETERS) ×2 IMPLANT
CLIP LIGATING EXTRA MED SLVR (CLIP) IMPLANT
CLIP TI MEDIUM 24 (CLIP) ×2 IMPLANT
CLIP TI WIDE RED SMALL 24 (CLIP) ×2 IMPLANT
DRAPE C-ARM 42X72 X-RAY (DRAPES) ×2 IMPLANT
DRAPE CV SPLIT W-CLR ANES SCRN (DRAPES) ×2 IMPLANT
DRAPE PERI GROIN 82X75IN TIB (DRAPES) ×2 IMPLANT
DRAPE WARM FLUID 44X44 (DRAPES) ×2 IMPLANT
DRSG TEGADERM 4X4.5 CHG (GAUZE/BANDAGES/DRESSINGS) ×2 IMPLANT
FELT TEFLON 1X6 (MISCELLANEOUS) IMPLANT
GAUZE 4X4 16PLY ~~LOC~~+RFID DBL (SPONGE) ×2 IMPLANT
GAUZE SPONGE 4X4 12PLY STRL LF (GAUZE/BANDAGES/DRESSINGS) ×2 IMPLANT
GLOVE SURG MICRO LTX SZ6.5 (GLOVE) ×4 IMPLANT
GLOVE SURG MICRO LTX SZ7.5 (GLOVE) ×6 IMPLANT
GLOVE SURG NEOP MICRO LF SZ7.5 (GLOVE) IMPLANT
GLOVE SURG UNDER POLY LF SZ6 (GLOVE) ×2 IMPLANT
GOWN STRL REUS W/ TWL LRG LVL3 (GOWN DISPOSABLE) ×3 IMPLANT
GOWN STRL REUS W/ TWL XL LVL3 (GOWN DISPOSABLE) ×2 IMPLANT
GOWN STRL REUS W/TWL LRG LVL3 (GOWN DISPOSABLE) ×6
GOWN STRL REUS W/TWL XL LVL3 (GOWN DISPOSABLE) ×4
GRAFT HEMASHIELD 10MM (Graft) ×2 IMPLANT
GRAFT VASC STRG 30X10STRL (Graft) ×1 IMPLANT
HEMOSTAT SURGICEL 2X14 (HEMOSTASIS) ×2 IMPLANT
INSERT FOGARTY SM (MISCELLANEOUS) ×2 IMPLANT
KIT BASIN OR (CUSTOM PROCEDURE TRAY) ×2 IMPLANT
LIGACLIP SM TITANIUM (CLIP) IMPLANT
LOOP VESSEL MINI RED (MISCELLANEOUS) ×2 IMPLANT
NS IRRIG 1000ML POUR BTL (IV SOLUTION) ×6 IMPLANT
PACK CHEST (CUSTOM PROCEDURE TRAY) ×2 IMPLANT
PAD ARMBOARD 7.5X6 YLW CONV (MISCELLANEOUS) ×4 IMPLANT
PAD ELECT DEFIB RADIOL ZOLL (MISCELLANEOUS) ×4 IMPLANT
PUMP SET IMPELLA 5.5 US (CATHETERS) ×2 IMPLANT
SEALANT SURG COSEAL 8ML (VASCULAR PRODUCTS) ×2 IMPLANT
SPONGE T-LAP 18X18 ~~LOC~~+RFID (SPONGE) ×4 IMPLANT
STAPLER VISISTAT 35W (STAPLE) IMPLANT
SUT ETHILON 3 0 PS 1 (SUTURE) IMPLANT
SUT PDS AB 1 CTX 36 (SUTURE) IMPLANT
SUT PROLENE 5 0 C 1 36 (SUTURE) ×10 IMPLANT
SUT PROLENE 7 0 BV 1 (SUTURE) ×4 IMPLANT
SUT SILK  1 MH (SUTURE) ×2
SUT SILK 1 MH (SUTURE) ×2 IMPLANT
SUT SILK 1 TIES 10X30 (SUTURE) IMPLANT
SUT VIC AB 1 CTX 36 (SUTURE) ×2
SUT VIC AB 1 CTX36XBRD ANBCTR (SUTURE) ×1 IMPLANT
SUT VIC AB 2-0 CT1 27 (SUTURE) ×2
SUT VIC AB 2-0 CT1 TAPERPNT 27 (SUTURE) ×1 IMPLANT
SUT VIC AB 3-0 SH 27 (SUTURE) ×2
SUT VIC AB 3-0 SH 27X BRD (SUTURE) ×1 IMPLANT
SUT VICRYL 3-0 27 CRC X-1 (SUTURE) ×2 IMPLANT
SYR 30ML LL (SYRINGE) ×4 IMPLANT
SYR 30ML SLIP (SYRINGE) ×2 IMPLANT
TOWEL GREEN STERILE (TOWEL DISPOSABLE) ×2 IMPLANT
TOWEL GREEN STERILE FF (TOWEL DISPOSABLE) ×4 IMPLANT
TRAY FOLEY SLVR 16FR TEMP STAT (SET/KITS/TRAYS/PACK) ×2 IMPLANT
WATER STERILE IRR 1000ML POUR (IV SOLUTION) ×2 IMPLANT
WIRE EMERALD 3MM-J .035X260CM (WIRE) ×2 IMPLANT

## 2020-12-21 NOTE — Progress Notes (Addendum)
ANTICOAGULATION CONSULT NOTE - Follow Up Consult  Pharmacy Consult for Heparin Indication: Impella/ pulmonary embolus  No Known Allergies  Patient Measurements: Height: 6\' 1"  (185.4 cm) Weight: 85.1 kg (187 lb 9.8 oz) IBW/kg (Calculated) : 79.9 Heparin Dosing Weight: 92.1 kg  Vital Signs: Temp: 97.9 F (36.6 C) (10/11 1230) Temp Source: Core (10/11 1100) BP: 102/89 (10/11 1200) Pulse Rate: 109 (10/11 1230)  Labs: Recent Labs    12/19/20 0500 12/19/20 1942 12/20/20 0428 12/20/20 0749 12/20/20 1548 12/21/20 0342 12/21/20 0916  HGB 12.9*  --  12.7*  --   --  12.9* 13.3  HCT 36.3*  --  36.2*  --   --  37.8* 39.0  PLT 110*  --  93*  --   --  84*  --   LABPROT 17.3*  --   --   --   --   --   --   INR 1.4*  --   --   --   --   --   --   HEPARINUNFRC 0.59  --  0.87*  --  0.73* 0.38  --   CREATININE 1.44*   < >  --  1.18  --  1.12 1.10   < > = values in this interval not displayed.     Estimated Creatinine Clearance: 89.8 mL/min (by C-G formula based on SCr of 1.1 mg/dL).   Medications:  Infusions:   heparin 50 units/mL (Impella PURGE) in dextrose 5 % 1000 mL bag 10.9 mL/hr at 12/21/20 1100   heparin 750 Units/hr (12/21/20 1200)   milrinone 0.5 mcg/kg/min (12/21/20 1200)   norepinephrine (LEVOPHED) Adult infusion 15 mcg/min (12/21/20 1231)      Assessment: 50 yo male with acute bilateral PE, moderate clot burden. Pharmacy consulted to dose heparin drip for PE.  No prior AC noted.  Patient returned from surgery 10/11 s/p impella 5.5 placement. Heparin level therapeutic this morning at 0.38 on 1300 units/hr. Now, heparin purge solution flowing through impella at a rate of 10.9 mL/hr (544 units/hour). Concentration of purge solution is 50 units/mL. Systemic heparin reduced to 750 units/hour. Hgb stable, platelets continue to drift down to 84, suspect further decline . HIT antibody negative, SRA pending. Monitor for further platelet decline and adjust system heparin as  indicated. LFTs have been up due to shock. INR initially up to 2 > now trended down to 1.4 from liver dysfunction (no Coumadin given). No signs of bleeding per RN.   Goal of Therapy:  Heparin level 0.3-0.5 units/ml for Impella + PE Monitor platelets by anticoagulation protocol: Yes   Plan:  Decrease IV heparin infusion to 750 units/hr Impella purge rate 544 units/hour  Heparin level at 1600 Daily heparin level and CBC Monitor for signs of bleeding   Thank you for allowing pharmacy to participate in this patient's care.  12/11, PharmD PGY1 Pharmacy Resident 12/21/2020 1:12 PM Check [email or web address] for unit specific pharmacy number   ADDENDUM Heparin level drawn, came back at >1.1 drawn from a line, upstream from heparin infusion. Patient has hematoma around impella site. Per MD, will hold systemic heparin until AM, continue with heparin purge, and reevaluate 10/12.

## 2020-12-21 NOTE — Progress Notes (Addendum)
Advanced Heart Failure Rounding Note   Subjective:   S/P Impella 5.5   Now on milrinone 0.5 and NE 16  Impella 5.5 flow 3.8   Swan numbers  RA = 17 PA = 51/35 ( 41) Thermo CO/CI = 4.2/ 2 SVR 1638  PVR -  Drowsy .  Denies pain.    Objective:   Weight Range:  Vital Signs:   Temp:  [97.7 F (36.5 C)-98.4 F (36.9 C)] 97.9 F (36.6 C) (10/11 0600) Pulse Rate:  [54-117] 110 (10/11 0727) Resp:  [7-27] 13 (10/11 0726) BP: (87-109)/(75-88) 103/77 (10/11 0600) SpO2:  [90 %-100 %] 96 % (10/11 0727) Arterial Line BP: (80-110)/(61-82) 101/77 (10/11 0726) Weight:  [85.1 kg] 85.1 kg (10/11 0600)    Weight change: Filed Weights   12/17/20 1536 12/19/20 1000 12/21/20 0600  Weight: 89.5 kg 86.2 kg 85.1 kg    Intake/Output:   Intake/Output Summary (Last 24 hours) at 12/21/2020 1048 Last data filed at 12/21/2020 1008 Gross per 24 hour  Intake 3057.55 ml  Output 3300 ml  Net -242.45 ml    CVP 17 Physical Exam: General:  No resp difficulty HEENT: normal Neck: supple. JVP 16-17. Carotids 2+ bilat; no bruits. No lymphadenopathy or thryomegaly appreciated.RIJ  swan LIJ Cor: PMI nondisplaced. Regular rate & rhythm. No rubs, gallops or murmurs.R axillary impella  Lungs: clear Abdomen: soft, nontender, nondistended. No hepatosplenomegaly. No bruits or masses. Good bowel sounds. Extremities: no cyanosis, clubbing, rash, edema. RUE aline.  Neuro: alert & orientedx3, cranial nerves grossly intact. moves all 4 extremities w/o difficulty. Affect pleasant GU: foley yellow urine.   Telemetry: Sinus Tach 100-110s  Labs: Basic Metabolic Panel: Recent Labs  Lab 12/16/20 0634 12/17/20 0457 12/17/20 1423 12/18/20 0330 12/19/20 0500 12/19/20 1942 12/20/20 0749 12/21/20 0342 12/21/20 0916  NA 135 134*   < > 133* 128* 126* 127* 126* 126*  K 4.7 4.7   < > 3.6 3.5 3.7 3.7 4.3 4.4  CL 104 100  --  99 94* 91* 92* 92* 91*  CO2 19* 18*  --  23 26 23 25 23   --   GLUCOSE 161*  151*  --  158* 144* 181* 169* 173* 217*  BUN 32* 49*  --  36* 21* 20 17 15 15   CREATININE 1.83* 2.66*  --  1.77* 1.44* 1.26* 1.18 1.12 1.10  CALCIUM 8.9 8.7*  --  8.2* 8.0* 8.2* 8.4* 8.5*  --   MG 2.2 2.3  --  2.0 1.8 2.1  --   --   --    < > = values in this interval not displayed.    Liver Function Tests: Recent Labs  Lab 12/16/20 0634 12/17/20 0457 12/18/20 0330 12/19/20 0500 12/20/20 0749  AST 404* 1,377* 678* 603* 311*  ALT 443* 1,248* 1,118* 1,084* 834*  ALKPHOS 36* 37* 36* 36* 36*  BILITOT 4.0* 4.7* 4.2* 3.1* 2.9*  PROT 6.0* 6.0* 5.5* 5.3* 5.6*  ALBUMIN 3.7 3.6 3.2* 3.0* 3.2*   Recent Labs  Lab 12/15/20 1716  LIPASE 73*   No results for input(s): AMMONIA in the last 168 hours.  CBC: Recent Labs  Lab 12/15/20 1716 12/16/20 0634 12/17/20 0457 12/17/20 1423 12/18/20 0330 12/19/20 0500 12/20/20 0428 12/21/20 0342 12/21/20 0916  WBC 7.8   < > 11.1*  --  6.3 4.9 4.6 4.7  --   NEUTROABS 5.2  --   --   --   --   --   --   --   --  HGB 15.5   < > 14.5   < > 13.4 12.9* 12.7* 12.9* 13.3  HCT 45.1   < > 41.8   < > 38.7* 36.3* 36.2* 37.8* 39.0  MCV 85.6   < > 83.4  --  83.0 81.9 81.7 82.4  --   PLT 168   < > 165  --  137* 110* 93* 84*  --    < > = values in this interval not displayed.    Cardiac Enzymes: Recent Labs  Lab 12/17/20 1122  CKTOTAL 149  CKMB 3.2    BNP: BNP (last 3 results) Recent Labs    12/15/20 1716 12/17/20 0457  BNP 2,024.7* 1,742.2*    ProBNP (last 3 results) No results for input(s): PROBNP in the last 8760 hours.    Other results:  Imaging: DG CHEST PORT 1 VIEW  Result Date: 12/20/2020 CLINICAL DATA:  CHF (congestive heart failure) (HCC) I50.9 (ICD-10-CM) EXAM: PORTABLE CHEST 1 VIEW COMPARISON:  December 17, 2020. FINDINGS: Enlarged cardiac silhouette, similar. Swan-Ganz catheter tip projects in the expected region of the main pulmonary artery. Similar positioning of a left IJ approach central venous catheter with the tip  projecting at the superior cavoatrial junction. No consolidation. No visible pleural effusions or pneumothorax. IMPRESSION: Similar marked cardiomegaly. No evidence of new acute cardiopulmonary disease. Electronically Signed   By: Feliberto Harts M.D.   On: 12/20/2020 13:02   DG Abd Portable 1V  Result Date: 12/20/2020 CLINICAL DATA:  Ileus (HCC) K56.7 (ICD-10-CM) EXAM: PORTABLE ABDOMEN - 1 VIEW COMPARISON:  CT abdomen/pelvis December 16, 2020. FINDINGS: Gas with nondilated colon with paucity of small bowel gas. No dilated bowel loops to suggest obstruction. No obvious evidence of free air on this supine radiograph. No abnormal calcifications or mass effect. Lower lumbar degenerative change. IMPRESSION: Gas with nondilated colon with paucity of small bowel gas. No dilated bowel loops to suggest obstruction. Electronically Signed   By: Feliberto Harts M.D.   On: 12/20/2020 10:54   DG C-Arm 1-60 Min-No Report  Result Date: 12/21/2020 Fluoroscopy was utilized by the requesting physician.  No radiographic interpretation.     Medications:     Scheduled Medications:  aspirin EC  81 mg Oral Daily   Chlorhexidine Gluconate Cloth  6 each Topical Daily   digoxin  0.125 mg Oral Daily   docusate sodium  100 mg Oral BID   pantoprazole  40 mg Oral Daily   polyethylene glycol  17 g Oral Daily   potassium chloride  40 mEq Oral BID   tamsulosin  0.4 mg Oral QPC supper    Infusions:  heparin 50 units/mL (Impella PURGE) in dextrose 5 % 1000 mL bag     heparin 1,300 Units/hr (12/21/20 0730)   milrinone 0.5 mcg/kg/min (12/21/20 0946)   norepinephrine (LEVOPHED) Adult infusion 16 mcg/min (12/21/20 1025)    PRN Medications: bisacodyl, fentaNYL (SUBLIMAZE) injection, ondansetron **OR** ondansetron (ZOFRAN) IV, traMADol   Assessment/Plan:   1. 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 RV severely reduced - Evidence of MSOF in the  setting of shock  - Blood Cx obtained. NGTD - Echo suggests NICM but he reports 1 month h/o severe R arm pain and CP prior to decompensation. Will need cath +/- cMRI next week - Persistent cardiogenic shock on norepi and milrinone.  -10/11 S/P Impella 5.5 today. FLow ok. Check daily LDH.  - Continue milrinone 0.5 mcg + norepi 16 mcg.  and NE.  - Continue digoxin 0.125  - Given social situation advanced therapies would be challenging but if he does not recover with medical therapy, he has extensive support from [employer]. We will work to get him Medicaid to keep transplant option on the table for him. Not VAD candidate with severe RV dysfunction - He is HCV positive. HIV nonreactive - Blood type B+ - Dr Gala Romney spoke to Dr. Edwena Blow at [facility]. They would consider him for transplant evaluation (will need Medicaid first).    2. Acute PE  - CTA + bilateral segmental/subsegmental PE likely due to low output - Continue Heparin drip  - PLTs dropping.  -HIT panel negative.  - SRA pending.     3. Shock liver - improving with hemodynamic support   4. AKI on CKD 3B - baseline SCr 1.8-2.0 - due to ATN from shock  - Creatinine normalized.   5. Thrombocytopenia - PLTs dropping. 84 today  -  HIT panel negative.  -SRA pending.   - can switch to bival as needed  6. HCV - will need viral RNA quant   7. 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 [facility] needs - I spoke with his caseworker April Anders [phone]). He was incarcerated almost a decade ago but not since. He has been homeless for years but recently moved into a hotel in June through the [facility]. 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 No substance use. Always first to volunteer to help other [employer] members move and do other tasks. Has a sister out of state but  doesn't her to be contacted until he is improving. [employer] members are very attached to him and said they would drive him back and forth to [facility] for transplant eval,if needed. Other [employer] contact: Brother John: [phone]     Length of Stay: 5   Amy Clegg NP-C  12/21/2020, 10:48 AM  Advanced Heart Failure Team Pager [phone] (M-F; 7a - 4p)  Please contact [facility] for night-coverage after hours (4p -7a ) and weekends on [email or web address]  Agree with above.   Underwent Impella 5.5 placement today. Now back in ICU. Remains on milrinone and NE. Feels ok. Still groggy. Wants Foley out  Impella P-6 good waveforms. Flow 3.9L  General:  Weak appearing. No resp difficulty HEENT: normal Neck: supple. RIJ swan  Carotids 2+ bilat; no bruits. No lymphadenopathy or thryomegaly appreciated. Cor: PMI nondisplaced. Regular rate & rhythm. No rubs, gallops or murmurs. RSC Impella site ok  Lungs: clear Abdomen: soft, nontender, nondistended. No hepatosplenomegaly. No bruits or masses. Good bowel sounds. Extremities: no cyanosis, clubbing, rash, tr edema Neuro: alert & orientedx3, cranial nerves grossly intact. moves all 4 extremities w/o difficulty. Affect pleasant  He is now s/p Impella 5.5 placement. Wean inotropes as tolerated. Watch platelets. Will need to get medicaid approved. D/w [facility]. Once Medicaid approved will transfer to [facility] for transplant eval.   CRITICAL CARE Performed by: Arvilla Meres  Total critical care time: 55 minutes  Critical care time was exclusive of separately billable procedures and treating other patients.  Critical care was necessary to treat or prevent imminent or life-threatening deterioration.  Critical care was time spent personally by me (independent of midlevel providers or residents) on the following activities: development of treatment  plan with patient and/or surrogate as well as nursing, discussions with consultants, evaluation of patient's response to  treatment, examination of patient, obtaining history from patient or surrogate, ordering and performing treatments and interventions, ordering and review of laboratory studies, ordering and review of radiographic studies, pulse oximetry and re-evaluation of patient's condition.  Arvilla Meres, MD  11:54 AM

## 2020-12-21 NOTE — Anesthesia Procedure Notes (Signed)
Procedure Name: Intubation Date/Time: 12/21/2020 7:52 AM Performed by: Glynda Jaeger, CRNA Pre-anesthesia Checklist: Patient identified, Patient being monitored, Timeout performed, Emergency Drugs available and Suction available Patient Re-evaluated:Patient Re-evaluated prior to induction Oxygen Delivery Method: Circle System Utilized Preoxygenation: Pre-oxygenation with 100% oxygen Induction Type: IV induction Ventilation: Mask ventilation without difficulty Laryngoscope Size: Mac and 4 Grade View: Grade I Tube type: Oral Tube size: 8.0 mm Number of attempts: 1 Airway Equipment and Method: Stylet Placement Confirmation: ETT inserted through vocal cords under direct vision, positive ETCO2 and breath sounds checked- equal and bilateral Secured at: 23 cm Tube secured with: Tape Dental Injury: Teeth and Oropharynx as per pre-operative assessment

## 2020-12-21 NOTE — Consult Note (Signed)
Reason for Consult:Impella placement Referring Physician: Dr. Marguarite Arbour Mom is an 50 y.o. male.  HPI: 73 yo man with a past history of substance abuse, stage IIIB CKD and enlarged prostate. Presented with CP and SOB. CT showed bilateral PE. Echo showed EF < 20%. Being cared for by advanced heart failure team. Has low co-ox despite milrinone and norepi. Needs mechanical support as possible bridge to transplant.  This AM ffels poorly but denies CP.  Past Medical History:  Diagnosis Date   Prostate enlargement    Substance abuse [facility])     Past Surgical History:  Procedure Laterality Date   ARTERIAL LINE INSERTION N/A 12/17/2020   Procedure: ARTERIAL LINE INSERTION;  Surgeon: Jolaine Artist, MD;  Location: [facility];  Service: Cardiovascular;  Laterality: N/A;   RIGHT HEART CATH N/A 12/17/2020   Procedure: RIGHT HEART CATH;  Surgeon: Jolaine Artist, MD;  Location: [facility];  Service: Cardiovascular;  Laterality: N/A;    Family History  Problem Relation Age of Onset   Sudden Cardiac Death Neg Hx     Social History:  reports that he has quit smoking. His smoking use included cigarettes. He has never used smokeless tobacco. He reports that he does not currently use alcohol. He reports that he does not currently use drugs after having used the following drugs: Cocaine and Marijuana.  Allergies: No Known Allergies  Medications: Scheduled:  [MAR Hold] aspirin EC  81 mg Oral Daily   [MAR Hold] Chlorhexidine Gluconate Cloth  6 each Topical Daily   [MAR Hold] digoxin  0.125 mg Oral Daily   [MAR Hold] docusate sodium  100 mg Oral BID   [MAR Hold] pantoprazole  40 mg Oral Daily   [MAR Hold] polyethylene glycol  17 g Oral Daily   [MAR Hold] potassium chloride  40 mEq Oral BID   [MAR Hold] tamsulosin  0.4 mg Oral QPC supper    Results for orders placed or performed during the hospital encounter of 12/15/20 (from the past 48 hour(s))  Heparin induced  platelet Ab (HIT antibody)     Status: None   Collection Time: 12/19/20 11:04 AM  Result Value Ref Range   Heparin Induced Plt Ab 0.137 0.000 - 0.400 OD    Comment: (NOTE) Performed At: [facility] [employer] [address] [place], [place] [id number] Rush Farmer MD [id number]   .Cooxemetry Panel (carboxy, met, total hgb, O2 sat)     Status: None   Collection Time: 12/19/20  5:50 PM  Result Value Ref Range   Total hemoglobin 13.8 12.0 - 16.0 g/dL   O2 Saturation 45.0 %   Carboxyhemoglobin 0.8 0.5 - 1.5 %   Methemoglobin 0.8 0.0 - 1.5 %    Comment: Performed at [facility] [address], [place], [place] [address]  Basic metabolic panel     Status: Abnormal   Collection Time: 12/19/20  7:42 PM  Result Value Ref Range   Sodium 126 (L) 135 - 145 mmol/L   Potassium 3.7 3.5 - 5.1 mmol/L   Chloride 91 (L) 98 - 111 mmol/L   CO2 23 22 - 32 mmol/L   Glucose, Bld 181 (H) 70 - 99 mg/dL    Comment: Glucose reference range applies only to samples taken after fasting for at least 8 hours.   BUN 20 6 - 20 mg/dL   Creatinine, Ser 1.26 (H) 0.61 - 1.24 mg/dL   Calcium 8.2 (L) 8.9 - 10.3 mg/dL  GFR, Estimated >60 >60 mL/min    Comment: (NOTE) Calculated using the CKD-EPI Creatinine Equation (2021)    Anion gap 12 5 - 15    Comment: Performed at [facility], [place] [address] [place], [place] [address]  Magnesium     Status: None   Collection Time: 12/19/20  7:42 PM  Result Value Ref Range   Magnesium 2.1 1.7 - 2.4 mg/dL    Comment: Performed at [facility], [place] [address] [place], [place] [address]  Cooxemetry Panel (carboxy, met, total hgb, O2 sat)     Status: None   Collection Time: 12/19/20 10:48 PM  Result Value Ref Range   Total hemoglobin 13.3 12.0 - 16.0 g/dL   O2 Saturation 60.2 %   Carboxyhemoglobin 1.3 0.5 - 1.5 %   Methemoglobin 0.8 0.0 - 1.5 %    Comment: Performed at [facility] [address] [place], [place] [address]  CBC     Status:  Abnormal   Collection Time: 12/20/20  4:28 AM  Result Value Ref Range   WBC 4.6 4.0 - 10.5 K/uL   RBC 4.43 4.22 - 5.81 MIL/uL   Hemoglobin 12.7 (L) 13.0 - 17.0 g/dL   HCT 36.2 (L) 39.0 - 52.0 %   MCV 81.7 80.0 - 100.0 fL   MCH 28.7 26.0 - 34.0 pg   MCHC 35.1 30.0 - 36.0 g/dL   RDW 13.8 11.5 - 15.5 %   Platelets 93 (L) 150 - 400 K/uL    Comment: Immature Platelet Fraction may be clinically indicated, consider ordering this additional test [id number] CONSISTENT WITH PREVIOUS RESULT REPEATED TO VERIFY    nRBC 0.0 0.0 - 0.2 %    Comment: Performed at [facility], [place] [address] [place], [place] [address]  Heparin level (unfractionated)     Status: Abnormal   Collection Time: 12/20/20  4:28 AM  Result Value Ref Range   Heparin Unfractionated 0.87 (H) 0.30 - 0.70 IU/mL    Comment: (NOTE) The clinical reportable range upper limit is being lowered to >1.10 to align with the FDA approved guidance for the current laboratory assay.  If heparin results are below expected values, and patient dosage has  been confirmed, suggest follow up testing of antithrombin III levels. Performed at [facility], [place] [address], [place], [place] [address]   .Cooxemetry Panel (carboxy, met, total hgb, O2 sat)     Status: None   Collection Time: 12/20/20  4:41 AM  Result Value Ref Range   Total hemoglobin 12.7 12.0 - 16.0 g/dL   O2 Saturation 61.2 %   Carboxyhemoglobin 1.0 0.5 - 1.5 %   Methemoglobin 0.8 0.0 - 1.5 %    Comment: Performed at [facility], [place] [address] [place], [place] [address]  Comprehensive metabolic panel     Status: Abnormal   Collection Time: 12/20/20  7:49 AM  Result Value Ref Range   Sodium 127 (L) 135 - 145 mmol/L   Potassium 3.7 3.5 - 5.1 mmol/L   Chloride 92 (L) 98 - 111 mmol/L   CO2 25 22 - 32 mmol/L   Glucose, Bld 169 (H) 70 - 99 mg/dL    Comment: Glucose reference range applies only to samples taken after fasting for at least 8 hours.   BUN 17 6  - 20 mg/dL   Creatinine, Ser 1.18 0.61 - 1.24 mg/dL   Calcium 8.4 (L) 8.9 - 10.3 mg/dL  Total Protein 5.6 (L) 6.5 - 8.1 g/dL   Albumin 3.2 (L) 3.5 - 5.0 g/dL   AST 311 (H) 15 - 41 U/L   ALT 834 (H) 0 - 44 U/L   Alkaline Phosphatase 36 (L) 38 - 126 U/L   Total Bilirubin 2.9 (H) 0.3 - 1.2 mg/dL   GFR, Estimated >60 >60 mL/min    Comment: (NOTE) Calculated using the CKD-EPI Creatinine Equation (2021)    Anion gap 10 5 - 15    Comment: Performed at [facility] [address] [place], [place] [address]  HCV RNA quant     Status: None   Collection Time: 12/20/20 10:12 AM  Result Value Ref Range   HCV Quantitative HCV Not Detected >50 IU/mL   Test Information Comment     Comment: (NOTE) The quantitative range of this assay is 15 IU/mL to 100 million IU/mL. Performed At: [facility] [place], [place] [id number] Rush Farmer MD [id number]   Heparin level (unfractionated)     Status: Abnormal   Collection Time: 12/20/20  3:48 PM  Result Value Ref Range   Heparin Unfractionated 0.73 (H) 0.30 - 0.70 IU/mL    Comment: (NOTE) The clinical reportable range upper limit is being lowered to >1.10 to align with the FDA approved guidance for the current laboratory assay.  If heparin results are below expected values, and patient dosage has  been confirmed, suggest follow up testing of antithrombin III levels. Performed at [facility], [place] [address], [place], [place] [address]   .Cooxemetry Panel (carboxy, met, total hgb, O2 sat)     Status: None   Collection Time: 12/20/20  5:14 PM  Result Value Ref Range   Total hemoglobin 13.5 12.0 - 16.0 g/dL   O2 Saturation 49.0 %   Carboxyhemoglobin 1.3 0.5 - 1.5 %   Methemoglobin 0.8 0.0 - 1.5 %    Comment: Performed at [facility] [address] [place], [place] [address]  .Cooxemetry Panel (carboxy, met, total hgb, O2 sat)     Status: None   Collection Time: 12/20/20  9:58 PM  Result Value  Ref Range   Total hemoglobin 13.7 12.0 - 16.0 g/dL   O2 Saturation 53.4 %   Carboxyhemoglobin 1.2 0.5 - 1.5 %   Methemoglobin 0.8 0.0 - 1.5 %    Comment: Performed at [facility] [address], [place], [place] [address]  Lactic acid, plasma     Status: None   Collection Time: 12/20/20 10:09 PM  Result Value Ref Range   Lactic Acid, Venous 1.3 0.5 - 1.9 mmol/L    Comment: Performed at [facility] [address] [place], [place] [address]  CBC     Status: Abnormal   Collection Time: 12/21/20  3:42 AM  Result Value Ref Range   WBC 4.7 4.0 - 10.5 K/uL   RBC 4.59 4.22 - 5.81 MIL/uL   Hemoglobin 12.9 (L) 13.0 - 17.0 g/dL   HCT 37.8 (L) 39.0 - 52.0 %   MCV 82.4 80.0 - 100.0 fL   MCH 28.1 26.0 - 34.0 pg   MCHC 34.1 30.0 - 36.0 g/dL   RDW 13.6 11.5 - 15.5 %   Platelets 84 (L) 150 - 400 K/uL    Comment: Immature Platelet Fraction may be clinically indicated, consider ordering this additional test [id number] CONSISTENT WITH PREVIOUS RESULT REPEATED TO VERIFY    nRBC 0.0 0.0 - 0.2 %  Comment: Performed at [facility], [place] [address], [place], [place] [address]  Heparin level (unfractionated)     Status: None   Collection Time: 12/21/20  3:42 AM  Result Value Ref Range   Heparin Unfractionated 0.38 0.30 - 0.70 IU/mL    Comment: (NOTE) The clinical reportable range upper limit is being lowered to >1.10 to align with the FDA approved guidance for the current laboratory assay.  If heparin results are below expected values, and patient dosage has  been confirmed, suggest follow up testing of antithrombin III levels. Performed at [facility], [place] [address], [place], [place] [address]   Basic metabolic panel     Status: Abnormal   Collection Time: 12/21/20  3:42 AM  Result Value Ref Range   Sodium 126 (L) 135 - 145 mmol/L   Potassium 4.3 3.5 - 5.1 mmol/L   Chloride 92 (L) 98 - 111 mmol/L   CO2 23 22 - 32 mmol/L   Glucose, Bld 173 (H) 70 - 99 mg/dL     Comment: Glucose reference range applies only to samples taken after fasting for at least 8 hours.   BUN 15 6 - 20 mg/dL   Creatinine, Ser 1.12 0.61 - 1.24 mg/dL   Calcium 8.5 (L) 8.9 - 10.3 mg/dL   GFR, Estimated >60 >60 mL/min    Comment: (NOTE) Calculated using the CKD-EPI Creatinine Equation (2021)    Anion gap 11 5 - 15    Comment: Performed at [facility] [address], [place], [place] [address]  .Cooxemetry Panel (carboxy, met, total hgb, O2 sat)     Status: None   Collection Time: 12/21/20  3:45 AM  Result Value Ref Range   Total hemoglobin 13.4 12.0 - 16.0 g/dL   O2 Saturation 54.9 %   Carboxyhemoglobin 1.0 0.5 - 1.5 %   Methemoglobin 0.8 0.0 - 1.5 %    Comment: Performed at [facility], [place] [address], [place], [place] [address]    DG CHEST PORT 1 VIEW  Result Date: 12/20/2020 CLINICAL DATA:  CHF (congestive heart failure) (HCC) I50.9 (ICD-10-CM) EXAM: PORTABLE CHEST 1 VIEW COMPARISON:  December 17, 2020. FINDINGS: Enlarged cardiac silhouette, similar. Swan-Ganz catheter tip projects in the expected region of the main pulmonary artery. Similar positioning of a left IJ approach central venous catheter with the tip projecting at the superior cavoatrial junction. No consolidation. No visible pleural effusions or pneumothorax. IMPRESSION: Similar marked cardiomegaly. No evidence of new acute cardiopulmonary disease. Electronically Signed   By: Margaretha Sheffield M.D.   On: 12/20/2020 13:02   DG Abd Portable 1V  Result Date: 12/20/2020 CLINICAL DATA:  Ileus (Jennings) K56.7 (ICD-10-CM) EXAM: PORTABLE ABDOMEN - 1 VIEW COMPARISON:  CT abdomen/pelvis December 16, 2020. FINDINGS: Gas with nondilated colon with paucity of small bowel gas. No dilated bowel loops to suggest obstruction. No obvious evidence of free air on this supine radiograph. No abnormal calcifications or mass effect. Lower lumbar degenerative change. IMPRESSION: Gas with nondilated colon with paucity of small  bowel gas. No dilated bowel loops to suggest obstruction. Electronically Signed   By: Margaretha Sheffield M.D.   On: 12/20/2020 10:54    Review of Systems  Constitutional:  Positive for activity change and fatigue.  Respiratory:  Positive for shortness of breath.   Cardiovascular:  Positive for chest pain and leg swelling.  Blood pressure 103/77, pulse (!) 110, temperature 97.9 F (36.6 C), resp. rate (!) 27, height 6'  1" (1.854 m), weight 86.2 kg, SpO2 96 %. Physical Exam Vitals reviewed.  Constitutional:      General: He is not in acute distress.    Appearance: He is ill-appearing.  HENT:     Head: Normocephalic and atraumatic.  Eyes:     General: No scleral icterus.    Extraocular Movements: Extraocular movements intact.  Cardiovascular:     Rate and Rhythm: Tachycardia present.     Heart sounds:    Gallop present.  Pulmonary:     Effort: No respiratory distress.     Breath sounds: No wheezing.     Comments: Diminished BS at bases Abdominal:     General: There is distension.     Tenderness: There is no abdominal tenderness.  Skin:    Comments: Cool, dry  Neurological:     General: No focal deficit present.     Mental Status: He is alert and oriented to person, place, and time.     Cranial Nerves: No cranial nerve deficit.     Motor: No weakness.    Assessment/Plan: 50 yo man with a past history of substance abuse, stage IIIB CKD and enlarged prostate. Presented with CP and SOB. 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  Needs mechanical support as a possible bridge to recovery or transplantation.  D/w Dr. Haroldine Laws of advanced heart failure team. Best option is Impella 5.5 for temporary support.  I discussed the proposed procedure with Mr. Lardizabal. I informed him of the indications, risks, benefits and alternatives. He understands the risks include but are not limited to death, MI, bleeding,  cardiac injury, stroke, infection as well as the possibility of other unforeseeable complications.  He agrees to proceed with Impella placement via right axillary artery this AM  Melrose Nakayama 12/21/2020, 7:13 AM

## 2020-12-21 NOTE — Anesthesia Preprocedure Evaluation (Signed)
Anesthesia Evaluation  Patient identified by MRN, date of birth, ID band Patient awake    Reviewed: Allergy & Precautions, H&P , NPO status , Patient's Chart, lab work & pertinent test results  Airway Mallampati: II   Neck ROM: full    Dental   Pulmonary former smoker,    breath sounds clear to auscultation       Cardiovascular +CHF   Rhythm:regular Rate:Normal  Acute biventricular heart failure  TTE (10/7): LVEF <20%. Severely reduced RV function.  LA and RA severely dilated.  On levo and milrinone gtts   Neuro/Psych    GI/Hepatic   Endo/Other    Renal/GU      Musculoskeletal   Abdominal   Peds  Hematology   Anesthesia Other Findings   Reproductive/Obstetrics                             Anesthesia Physical Anesthesia Plan  ASA: 4  Anesthesia Plan: General   Post-op Pain Management:    Induction: Intravenous  PONV Risk Score and Plan: 2 and Ondansetron, Dexamethasone, Midazolam and Treatment may vary due to age or medical condition  Airway Management Planned: Oral ETT  Additional Equipment: Arterial line, CVP and TEE  Intra-op Plan:   Post-operative Plan: Possible Post-op intubation/ventilation  Informed Consent: I have reviewed the patients History and Physical, chart, labs and discussed the procedure including the risks, benefits and alternatives for the proposed anesthesia with the patient or authorized representative who has indicated his/her understanding and acceptance.     Dental advisory given  Plan Discussed with: CRNA, Anesthesiologist and Surgeon  Anesthesia Plan Comments:         Anesthesia Quick Evaluation

## 2020-12-21 NOTE — Progress Notes (Signed)
CSW met at bedside with patient and Cortlin Polo, LCSW-A to discuss options for Medicaid and disability. Initially, patient had reported that he did not think he was eligible for medicaid as in the past he was told that due to felony conviction he would not be able to apply for medicaid. CSW contacted supervisor Stefanie Libel at [facility] to clarify and she contacted DSS and confirmed that a felony conviction does NOT prevent a patient from applying for medicaid. She informed CSW that her Drue Flirt from her staff will be completing the application for Medicaid and they will assist with processing. Cortlin Polo completed [employer] application to begin the disability process. Patient aware of above information.CSW will continue to collaborate on application and assist where needed to expedite application. Raquel Sarna, [place], [place]

## 2020-12-21 NOTE — Brief Op Note (Signed)
12/21/2020  10:52 AM  PATIENT:  Dylan Chaney  50 y.o. male  PRE-OPERATIVE DIAGNOSIS:  Cardiogenic Shock  POST-OPERATIVE DIAGNOSIS:  Cardiogenic Shock  PROCEDURE:  Procedure(s): PLACEMENT OF IMPELLA LEFT VENTRICULAR ASSIST DEVICE (N/A)  SURGEON:  Surgeon(s) and Role:    * Loreli Slot, MD - Primary    * Bensimhon, Bevelyn Buckles, MD - Assisting  PHYSICIAN ASSISTANT:   ASSISTANTS: none   ANESTHESIA:   general  EBL:  500 mL   BLOOD ADMINISTERED:none  DRAINS:  Impella 5.5    LOCAL MEDICATIONS USED:  NONE  SPECIMEN:  No Specimen  DISPOSITION OF SPECIMEN:  N/A  COUNTS:  YES  TOURNIQUET:  * No tourniquets in log *  DICTATION: .Other Dictation: Dictation Number -  PLAN OF CARE: Admit to inpatient   PATIENT DISPOSITION:  ICU - extubated and stable.   Delay start of Pharmacological VTE agent (>24hrs) due to surgical blood loss or risk of bleeding: N/A - on heparin

## 2020-12-21 NOTE — Anesthesia Procedure Notes (Signed)
Central Venous Catheter Insertion Performed by: Achille Rich, MD, anesthesiologist Start/End10/01/2021 8:10 AM, 12/21/2020 8:23 AM Patient location: Pre-op. Preanesthetic checklist: patient identified, IV checked, site marked, risks and benefits discussed, surgical consent, monitors and equipment checked, pre-op evaluation, timeout performed and anesthesia consent Patient sedated Hand hygiene performed  and maximum sterile barriers used  PA cath was placed.Swan type:thermodilution Procedure performed without using ultrasound guided technique. Attempts: 1 Patient tolerated the procedure well with no immediate complications.

## 2020-12-21 NOTE — Transfer of Care (Signed)
Immediate Anesthesia Transfer of Care Note  Patient: Dylan Chaney  Procedure(s) Performed: PLACEMENT OF IMPELLA LEFT VENTRICULAR ASSIST DEVICE  Patient Location: ICU  Anesthesia Type:General  Level of Consciousness: awake, alert , oriented, patient cooperative and responds to stimulation  Airway & Oxygen Therapy: Patient Spontanous Breathing and Patient connected to face mask oxygen  Post-op Assessment: Report given to RN and Post -op Vital signs reviewed and stable  Post vital signs: Reviewed and stable  Last Vitals:  Vitals Value Taken Time  BP    Temp 36.3 C 12/21/20 1050  Pulse 107 12/21/20 1050  Resp 15 12/21/20 1050  SpO2 100 % 12/21/20 1050  Vitals shown include unvalidated device data.  Last Pain:  Vitals:   12/21/20 0055  TempSrc:   PainSc: Asleep      Patients Stated Pain Goal: 3 (12/21/20 0010)  Complications: No notable events documented.

## 2020-12-21 NOTE — Progress Notes (Signed)
Resting, prn pain meds for discomfort R chest/Impella insertion site. Pressure dressing remains in place, no further bleeding. RUE/hands warm to touch, doppler radial pulse, denies numbness or tingling. Monitoring closely for complications or changes. Hemodynamically stable with Impella on P6 and levo @ 15, Milrinone @ 0.5.

## 2020-12-21 NOTE — Progress Notes (Signed)
Patient developed large hematoma at Impella site.   Heparin turned off. I manually held pressure for > 30 minutes. Pressure dressing applied.   Hemodynamics stable.   CBC sent.   Additional CCT 40 mins.   Arvilla Meres, MD  4:21 PM

## 2020-12-21 NOTE — Plan of Care (Signed)
  Problem: Clinical Measurements: Goal: Diagnostic test results will improve Outcome: Progressing Goal: Respiratory complications will improve Outcome: Progressing Goal: Cardiovascular complication will be avoided Outcome: Not Progressing   Problem: Pain Managment: Goal: General experience of comfort will improve Outcome: Progressing

## 2020-12-21 NOTE — TOC Progression Note (Addendum)
Transition of Care [facility]) - Progression Note    Patient Details  Name: Gerald Kuehl MRN: [id number] Date of Birth: 1971/02/18  [facility]) CM/SW Contact  Abella Shugart, LCSWA Phone Number: 12/21/2020, 2:52 PM  Clinical Narrative:    HF CSW and outpatient CSW spoke with Mr. Bushong at bedside to discuss about Medicaid and disability. CSW obtained Mr. Coccia signature for the [facility] application for disability and Mr. Berkel agreed to sign and see about pursing disability. Mr. Bascom reported that John from the [facility] will be collecting his belongings from the hotel and to see about further housing options. Outpatient HF CSW was able to find out that there shouldn't be barriers for Medicaid for Mr. [place] and he should be able to pursue federal and state support with no issues. CSW relayed this information to Mr. Voris.  Disability application sent to [facility] and CSW will follow up for expedited application for disability and Medicaid.  CSW will continue to follow throughout discharge.   Expected Discharge Plan: Home w Home Health Services Barriers to Discharge: Continued Medical Work up  Expected Discharge Plan and Services Expected Discharge Plan: Home w Home Health Services In-house Referral: Clinical Social Work Discharge Planning Services: CM Consult Post Acute Care Choice: Home Health Living arrangements for the past 2 months: Hotel/Motel                                       Social Determinants of Health (SDOH) Interventions Financial Strain Interventions: Artist Housing Interventions: Other (Comment) (Pt. reports living at a [facility] [address] [place]) Transportation Interventions: [employer] [facility]  Readmission Risk Interventions No flowsheet data found.  Harkirat Orozco, MSW, LCSWA [phone] Heart Failure Social Worker

## 2020-12-21 NOTE — Anesthesia Postprocedure Evaluation (Signed)
Anesthesia Post Note  Patient: Dylan Chaney  Procedure(s) Performed: PLACEMENT OF IMPELLA LEFT VENTRICULAR ASSIST DEVICE     Patient location during evaluation: SICU Anesthesia Type: General Level of consciousness: awake and alert Pain management: pain level controlled Vital Signs Assessment: post-procedure vital signs reviewed and stable Respiratory status: spontaneous breathing, nonlabored ventilation, respiratory function stable and patient connected to nasal cannula oxygen Cardiovascular status: blood pressure returned to baseline and stable Postop Assessment: no apparent nausea or vomiting Anesthetic complications: no   No notable events documented.  Last Vitals:  Vitals:   12/21/20 1127 12/21/20 1130  BP:    Pulse: (!) 109 (!) 109  Resp: 16 17  Temp: 36.5 C 36.5 C  SpO2: 98% 96%    Last Pain:  Vitals:   12/21/20 1100  TempSrc: Core  PainSc: 0-No pain                 Nakai Pollio S

## 2020-12-21 NOTE — Progress Notes (Signed)
Patient called out for bleeding.  RN assessed patient. Patient's aline was bleeding.  RN held pressure. Patient had what felt like a hematoma on his arm.  Ok to d/c aline per MD. Manual pressure held and an ace wrap placed for a bit.    Gauze dressing placed. Hemostasis obtained.  RN will continue to monitor patient closely.

## 2020-12-21 NOTE — Progress Notes (Signed)
ANTICOAGULATION CONSULT NOTE - Follow Up Consult  Pharmacy Consult for Heparin Indication: pulmonary embolus  No Known Allergies  Patient Measurements: Height: 6\' 1"  (185.4 cm) Weight: 86.2 kg (190 lb 0.6 oz) IBW/kg (Calculated) : 79.9 Heparin Dosing Weight: 92.1 kg  Vital Signs: Temp: 98.1 F (36.7 C) (10/11 0400) BP: 99/81 (10/11 0400) Pulse Rate: 108 (10/11 0400)  Labs: Recent Labs    12/19/20 0500 12/19/20 1942 12/20/20 0428 12/20/20 0749 12/20/20 1548 12/21/20 0342  HGB 12.9*  --  12.7*  --   --  12.9*  HCT 36.3*  --  36.2*  --   --  37.8*  PLT 110*  --  93*  --   --  84*  LABPROT 17.3*  --   --   --   --   --   INR 1.4*  --   --   --   --   --   HEPARINUNFRC 0.59  --  0.87*  --  0.73* 0.38  CREATININE 1.44* 1.26*  --  1.18  --  1.12     Estimated Creatinine Clearance: 88.2 mL/min (by C-G formula based on SCr of 1.12 mg/dL).   Medications:  Infusions:   heparin 1,300 Units/hr (12/21/20 0500)   milrinone 0.5 mcg/kg/min (12/21/20 0500)   norepinephrine (LEVOPHED) Adult infusion 15 mcg/min (12/21/20 0500)    Assessment: 50 yo male with acute bilateral PE, moderate clot burden, no right heart strain.  Pharmacy consulted to dose heparin drip for PE.  No prior AC noted.   HL 0.38 within normal limits after rate decrease to 1300 units/hr on 10/10. Hgb stable, platelets continue to drift down to 84. HIT antibody negative. Will continue heparin, LFTs have been up due to shock. INR initially up to 2 > now trend down to 1.4 from liver dysfunction (no Coumadin given). No signs of bleeding per RN.   Goal of Therapy:  Heparin level 0.3-0.7 units/ml Monitor platelets by anticoagulation protocol: Yes   Plan:  Continue IV heparin infusion at 1300 units/hr Daily heparin level and CBC Monitor for signs of bleeding   Thank you for allowing pharmacy to participate in this patient's care.  12/10, PharmD PGY1 Pharmacy Resident 12/21/2020 6:24 AM Check [email or web address]  for unit specific pharmacy number

## 2020-12-22 ENCOUNTER — Encounter (HOSPITAL_COMMUNITY): Payer: Self-pay | Admitting: Thoracic Surgery (Cardiothoracic Vascular Surgery)

## 2020-12-22 ENCOUNTER — Inpatient Hospital Stay (HOSPITAL_COMMUNITY): Payer: Medicaid Other

## 2020-12-22 LAB — CBC
HCT: 32.7 % — ABNORMAL LOW (ref 39.0–52.0)
Hemoglobin: 11.1 g/dL — ABNORMAL LOW (ref 13.0–17.0)
MCH: 28.5 pg (ref 26.0–34.0)
MCHC: 33.9 g/dL (ref 30.0–36.0)
MCV: 83.8 fL (ref 80.0–100.0)
Platelets: 79 10*3/uL — ABNORMAL LOW (ref 150–400)
RBC: 3.9 MIL/uL — ABNORMAL LOW (ref 4.22–5.81)
RDW: 13.5 % (ref 11.5–15.5)
WBC: 8.7 10*3/uL (ref 4.0–10.5)
nRBC: 0 % (ref 0.0–0.2)

## 2020-12-22 LAB — ECHO INTRAOPERATIVE TEE
Height: 73 in
Weight: 3001.78 oz

## 2020-12-22 LAB — BASIC METABOLIC PANEL
Anion gap: 8 (ref 5–15)
Anion gap: 8 (ref 5–15)
BUN: 11 mg/dL (ref 6–20)
BUN: 11 mg/dL (ref 6–20)
CO2: 24 mmol/L (ref 22–32)
CO2: 25 mmol/L (ref 22–32)
Calcium: 8.4 mg/dL — ABNORMAL LOW (ref 8.9–10.3)
Calcium: 8.2 mg/dL — ABNORMAL LOW (ref 8.9–10.3)
Chloride: 91 mmol/L — ABNORMAL LOW (ref 98–111)
Chloride: 94 mmol/L — ABNORMAL LOW (ref 98–111)
Creatinine, Ser: 1.14 mg/dL (ref 0.61–1.24)
Creatinine, Ser: 1.08 mg/dL (ref 0.61–1.24)
GFR, Estimated: >60 mL/min (ref >60)
GFR, Estimated: >60 mL/min (ref >60)
Glucose, Bld: 187 mg/dL — ABNORMAL HIGH (ref 70–99)
Glucose, Bld: 212 mg/dL — ABNORMAL HIGH (ref 70–99)
Potassium: 4.9 mmol/L (ref 3.5–5.1)
Potassium: 5.2 mmol/L — ABNORMAL HIGH (ref 3.5–5.1)
Sodium: 123 mmol/L — ABNORMAL LOW (ref 135–145)
Sodium: 127 mmol/L — ABNORMAL LOW (ref 135–145)

## 2020-12-22 LAB — POCT I-STAT 7, (LYTES, BLD GAS, ICA,H+H)
Acid-base deficit: 2 mmol/L (ref 0.0–2.0)
Bicarbonate: 21.8 mmol/L (ref 20.0–28.0)
Calcium, Ion: 1.11 mmol/L — ABNORMAL LOW (ref 1.15–1.40)
HCT: 35 % — ABNORMAL LOW (ref 39.0–52.0)
Hemoglobin: 11.9 g/dL — ABNORMAL LOW (ref 13.0–17.0)
O2 Saturation: 94 %
Patient temperature: 37.2
Potassium: 4.8 mmol/L (ref 3.5–5.1)
Sodium: 122 mmol/L — ABNORMAL LOW (ref 135–145)
TCO2: 23 mmol/L (ref 22–32)
pCO2 arterial: 34.8 mmHg (ref 32.0–48.0)
pH, Arterial: 7.406 (ref 7.350–7.450)
pO2, Arterial: 69 mmHg — ABNORMAL LOW (ref 83.0–108.0)

## 2020-12-22 LAB — CULTURE, BLOOD (ROUTINE X 2)
Culture: NO GROWTH
Culture: NO GROWTH
Special Requests: ADEQUATE

## 2020-12-22 LAB — COOXEMETRY PANEL
Carboxyhemoglobin: 1.7 % — ABNORMAL HIGH (ref 0.5–1.5)
Carboxyhemoglobin: 1.7 % — ABNORMAL HIGH (ref 0.5–1.5)
Carboxyhemoglobin: 1.8 % — ABNORMAL HIGH (ref 0.5–1.5)
Methemoglobin: 0.9 % (ref 0.0–1.5)
Methemoglobin: 1 % (ref 0.0–1.5)
Methemoglobin: 0.9 % (ref 0.0–1.5)
O2 Saturation: 48.1 %
O2 Saturation: 51.5 %
O2 Saturation: 49.7 %
Total hemoglobin: 11.3 g/dL — ABNORMAL LOW (ref 12.0–16.0)
Total hemoglobin: 11.3 g/dL — ABNORMAL LOW (ref 12.0–16.0)
Total hemoglobin: 11.6 g/dL — ABNORMAL LOW (ref 12.0–16.0)

## 2020-12-22 LAB — MAGNESIUM: Magnesium: 1.8 mg/dL (ref 1.7–2.4)

## 2020-12-22 LAB — HEPARIN LEVEL (UNFRACTIONATED)
Heparin Unfractionated: <0.1 IU/mL — ABNORMAL LOW (ref 0.30–0.70)
Heparin Unfractionated: <0.1 IU/mL — ABNORMAL LOW (ref 0.30–0.70)

## 2020-12-22 LAB — LACTATE DEHYDROGENASE: LDH: 226 U/L — ABNORMAL HIGH (ref 98–192)

## 2020-12-22 MED ORDER — SODIUM CHLORIDE 0.9% FLUSH: 10.0000 mL | INTRAVENOUS | Status: DC | PRN

## 2020-12-22 MED ORDER — NOREPINEPHRINE 16 MG/250ML-% IV SOLN
0.0000 ug/min | INTRAVENOUS | Status: DC
  Administered 2020-12-22: 2 ug/min via INTRAVENOUS
  Administered 2020-12-22: 18 ug/min via INTRAVENOUS
  Administered 2020-12-23: 21 ug/min via INTRAVENOUS
  Administered 2020-12-24: 19 ug/min via INTRAVENOUS
  Administered 2020-12-24: 20 ug/min via INTRAVENOUS
  Administered 2020-12-25: 25 ug/min via INTRAVENOUS
  Administered 2020-12-25: 26 ug/min via INTRAVENOUS
  Administered 2020-12-26: 25 ug/min via INTRAVENOUS
  Administered 2020-12-27: 28 ug/min via INTRAVENOUS
  Administered 2020-12-27: 18 ug/min via INTRAVENOUS
  Administered 2020-12-28: 16 ug/min via INTRAVENOUS
  Administered 2020-12-29: 27 ug/min via INTRAVENOUS
  Administered 2020-12-29: 16 ug/min via INTRAVENOUS
  Administered 2020-12-30: 28 ug/min via INTRAVENOUS
  Administered 2020-12-30: 29 ug/min via INTRAVENOUS
  Administered 2020-12-31: 22 ug/min via INTRAVENOUS
  Administered 2020-12-31: 28 ug/min via INTRAVENOUS
  Administered 2020-12-31: 25 ug/min via INTRAVENOUS
  Administered 2021-01-01: 29 ug/min via INTRAVENOUS
  Administered 2021-01-01: 32 ug/min via INTRAVENOUS
  Administered 2021-01-01: 30 ug/min via INTRAVENOUS
  Administered 2021-01-02: 25 ug/min via INTRAVENOUS
  Administered 2021-01-02: 26 ug/min via INTRAVENOUS
  Administered 2021-01-03 (×2): 25 ug/min via INTRAVENOUS
  Administered 2021-01-04 (×2): 32 ug/min via INTRAVENOUS
  Administered 2021-01-05: 35 ug/min via INTRAVENOUS
  Administered 2021-01-05: 32 ug/min via INTRAVENOUS
  Filled 2020-12-22 (×33): qty 250

## 2020-12-22 MED ORDER — SODIUM CHLORIDE 0.9% FLUSH
10.0000 mL | Freq: Two times a day (BID) | INTRAVENOUS | Status: DC
  Administered 2020-12-22 – 2020-12-23 (×4): 10 mL

## 2020-12-22 MED ORDER — ENSURE ENLIVE PO LIQD
237.0000 mL | Freq: Two times a day (BID) | ORAL | Status: DC
  Administered 2020-12-23 (×2): 237 mL via ORAL

## 2020-12-22 MED ORDER — ACETAMINOPHEN 325 MG PO TABS
650.0000 mg | ORAL_TABLET | Freq: Once | ORAL | Status: AC
  Administered 2020-12-23: 650 mg via ORAL
  Filled 2020-12-22: qty 2

## 2020-12-22 MED ORDER — MAGNESIUM SULFATE 2 GM/50ML IV SOLN
2.0000 g | Freq: Once | INTRAVENOUS | Status: AC
  Administered 2020-12-22: 2 g via INTRAVENOUS
  Filled 2020-12-22: qty 50

## 2020-12-22 MED ORDER — FUROSEMIDE 10 MG/ML IJ SOLN
40.0000 mg | Freq: Three times a day (TID) | INTRAMUSCULAR | Status: AC
  Administered 2020-12-22 (×2): 40 mg via INTRAVENOUS
  Filled 2020-12-22 (×2): qty 4

## 2020-12-22 MED ORDER — OXYCODONE HCL 5 MG PO TABS
5.0000 mg | ORAL_TABLET | ORAL | Status: DC | PRN
  Administered 2020-12-22 – 2020-12-23 (×2): 5 mg via ORAL
  Filled 2020-12-22 (×2): qty 1

## 2020-12-22 MED ORDER — SORBITOL 70 % SOLN
30.0000 mL | Freq: Once | Status: AC
  Administered 2020-12-22: 30 mL via ORAL
  Filled 2020-12-22: qty 30

## 2020-12-22 MED ORDER — TOLVAPTAN 15 MG PO TABS
15.0000 mg | ORAL_TABLET | ORAL | Status: DC
  Administered 2020-12-22: 15 mg via ORAL
  Filled 2020-12-22 (×2): qty 1

## 2020-12-22 MED ORDER — HEPARIN (PORCINE) 25000 UT/250ML-% IV SOLN
950.0000 [IU]/h | INTRAVENOUS | Status: DC
  Administered 2020-12-22: 300 [IU]/h via INTRAVENOUS
  Administered 2020-12-24: 950 [IU]/h via INTRAVENOUS
  Filled 2020-12-22: qty 250

## 2020-12-22 MED FILL — Heparin Sodium (Porcine) Inj 5000 Unit/ML: INTRAMUSCULAR | Qty: 10 | Status: AC

## 2020-12-22 NOTE — Plan of Care (Signed)
  Problem: Education: Goal: Knowledge of General Education information will improve Description: Including pain rating scale, medication(s)/side effects and non-pharmacologic comfort measures Outcome: Progressing   Problem: Coping: Goal: Level of anxiety will decrease Outcome: Progressing   Problem: Elimination: Goal: Will not experience complications related to urinary retention Outcome: Progressing   Problem: Safety: Goal: Ability to remain free from injury will improve Outcome: Progressing   Problem: Skin Integrity: Goal: Risk for impaired skin integrity will decrease Outcome: Progressing   Problem: Education: Goal: Ability to demonstrate management of disease process will improve Outcome: Progressing   Problem: Activity: Goal: Capacity to carry out activities will improve Outcome: Progressing   Problem: Cardiac: Goal: Ability to achieve and maintain adequate cardiopulmonary perfusion will improve Outcome: Progressing   Problem: Pain Managment: Goal: General experience of comfort will improve Outcome: Not Progressing

## 2020-12-22 NOTE — TOC Progression Note (Addendum)
Transition of Care [facility]) - Progression Note    Patient Details  Name: Steward Sames MRN: [id number] Date of Birth: 01-May-1970  Transition of [facility]) CM/SW Contact  Amarys Sliwinski, LCSWA Phone Number: 12/22/2020, 2:49 PM  Clinical Narrative:    HF CSW received a missed call from Mr. Jorge Ny at the [facility] [phone] and CSW returned the call and had to leave a voicemail for him to call back. CSW reached out to [employer] regarding the Medicaid application and B. Effie Shy reported that the application has been sent to DSS and she will share who the case was sent to when she finds out.   CSW received a call from April Anderson with the [facility] who reported they just had a team meeting discussing Mr. Sena and his situation, potential advanced directives, living will, and his estranged wife and decisions if necessary. CSW spoke with Mr. Mcgrady at bedside regarding this discussion about his estranged wife and if he would like to set up a living will or advanced directives and Mr. Rosenzweig was unsure including not knowing what to say or what he would want. Mr. Bordelon reported "God has a plan for me and whatever will be will be." CSW will allow time for him to think and will revisit the subject tomorrow.   CSW spoke with Florencia Reasons with the [facility] who reported that he spoke with the patient's sister and mother with the patients permission who plan to come visit Mr. Easom tomorrow. John reported that he plans to come by tomorrow at 10am. Conversations will continue tomorrow.  CSW reached out to the [facility] to follow up about the disability application and CSW had to leave a voicemail for them to return the call.  CSW will continue to follow throughout discharge.   Expected Discharge Plan: Home w Home Health Services Barriers to Discharge: Continued Medical Work up  Expected Discharge Plan and Services Expected Discharge Plan: Home w Home Health Services In-house Referral: Clinical  Social Work Discharge Planning Services: CM Consult Post Acute Care Choice: Home Health Living arrangements for the past 2 months: Hotel/Motel                                       Social Determinants of Health (SDOH) Interventions Financial Strain Interventions: Artist Housing Interventions: Other (Comment) (Pt. reports living at a [facility] [address] [place]) Transportation Interventions: [employer]  Readmission Risk Interventions No flowsheet data found.  Lliam Hoh, MSW, LCSWA [phone] Heart Failure Social Worker

## 2020-12-22 NOTE — Progress Notes (Signed)
ANTICOAGULATION CONSULT NOTE - Follow Up Consult  Pharmacy Consult for Heparin Indication: Impella/ pulmonary embolus  No Known Allergies  Patient Measurements: Height: 6\' 1"  (185.4 cm) Weight: 93.3 kg (205 lb 11 oz) (weighed x 2, bed without extra items) IBW/kg (Calculated) : 79.9 Heparin Dosing Weight: 92.1 kg  Vital Signs: Temp: 98.8 F (37.1 C) (10/12 2001) Temp Source: Oral (10/12 2001) BP: 100/76 (10/12 2000) Pulse Rate: 116 (10/12 2000)  Labs: Recent Labs    12/21/20 0342 12/21/20 0916 12/21/20 1528 12/22/20 0410 12/22/20 0415 12/22/20 1322 12/22/20 1846  HGB 12.9* 13.3 12.3* 11.1* 11.9*  --   --   HCT 37.8* 39.0 34.8* 32.7* 35.0*  --   --   PLT 84*  --  90* 79*  --   --   --   HEPARINUNFRC 0.38  --  >1.10* <0.10*  --   --  <0.10*  CREATININE 1.12 1.10  --  1.14  --  1.08  --      Estimated Creatinine Clearance: 91.4 mL/min (by C-G formula based on SCr of 1.08 mg/dL).   Medications:  Infusions:   sodium chloride 20 mL/hr at 12/22/20 1300   heparin 50 units/mL (Impella PURGE) in dextrose 5 % 1000 mL bag 10.9 mL/hr at 12/21/20 1100   heparin 300 Units/hr (12/22/20 1300)   milrinone 0.5 mcg/kg/min (12/22/20 2106)   norepinephrine (LEVOPHED) Adult infusion 16 mcg/min (12/22/20 1300)      Assessment: 50 yo male with acute bilateral PE, moderate clot burden. Pharmacy consulted to dose heparin drip for PE.  No prior AC noted.  Patient returned from surgery 10/11 s/p impella 5.5 placement. Heparin level therapeutic this morning at <0.1 after systemic heparin held. Now, heparin purge solution flowing through impella at a rate of 11.7 mL/hr (585 units/hour). Concentration of purge solution is 50 units/mL. Systemic heparin to be restarted per heart failure team at a low rate of 300 units/hour with plan to stay on the lower end of heparin level goal. Hgb stable, platelets continue to drift down to 79. HIT antibody negative, SRA pending. Monitor for further platelet  decline and adjust system heparin as indicated. Patient had a hematoma at the impella site yesterday, resolving after 30 minutes of pressure. Also experienced art line bleeding. No signs of bleeding today.    PM update: heparin level undetectable on systemic heparin at 300 units/hr and heparin purge at 11.9 ml/hr (595 units/hr). Per RN patient with continued nosebleed since yesterday but packed and stable. No other signs of bleeding noted. No issues with IV infusion or access.  Goal of Therapy:  Heparin level 0.3-0.5 units/ml for Impella + PE Monitor platelets by anticoagulation protocol: Yes   Plan:  Increase systemic heparin to 450 units/hr Check 6 hr heparin level Daily heparin level and CBC Monitor for signs of bleeding   Thank you for allowing pharmacy to participate in this patient's care.  12/11, PharmD, BCPS Clinical Pharmacist 12/22/2020 9:15 PM

## 2020-12-22 NOTE — Evaluation (Addendum)
Physical Therapy Evaluation Patient Details Name: Dylan Chaney MRN: [id number] DOB: 09/09/1970 Today's Date: 12/22/2020  History of Present Illness  Pt is a 50 y.o. M who presents with CP and SOB. CT showed bilateral PE. Echo showed EF < 20%. Decision made for mechanical support as possible bridge to transplant. 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 Significant PMH: remote drug and alcohol abuse.  Clinical Impression  PTA, pt lives alone in a motel and works as a Public affairs consultant for [employer] Pt presents with decreased cardiopulmonary endurance, mild balance deficits, and weakness. Pt overall moving well for POD#1 following Impella placement. Able to perform bed level warm up exercises and then take pivotal steps from bed to chair with up to min assist (+2 equipment). Pt fatigued, but denies dizziness/lightheadedness. Suspect pt will make good progress during his inpatient stay. Will continue to progress mobility as tolerated.  Vitals:  Supine: 81/45 (56), Sitting in chair - serial readings: 47/34 (40), 60/39 (47), 100/45 (61) SpO2 99% on RA, HR 114     Recommendations for follow up therapy are one component of a multi-disciplinary discharge planning process, led by the attending physician.  Recommendations may be updated based on patient status, additional functional criteria and insurance authorization.  Follow Up Recommendations Home health PT;Supervision for mobility/OOB    Equipment Recommendations  3in1 (PT);Other (comment) (Rollator)    Recommendations for Other Services       Precautions / Restrictions Precautions Precautions: Fall;Other (comment) Precaution Comments: Impella, Theone Murdoch Restrictions Weight Bearing Restrictions: No      Mobility  Bed Mobility Overal bed mobility: Needs Assistance Bed Mobility: Supine to Sit     Supine to sit: Min guard     General bed mobility comments: No physical assist to progress to EOB     Transfers Overall transfer level: Needs assistance Equipment used: None Transfers: Sit to/from Stand Sit to Stand: Min assist;+2 safety/equipment         General transfer comment: MinA to rise and steady from edge of bed  Ambulation/Gait Ambulation/Gait assistance: Min guard;+2 safety/equipment Gait Distance (Feet): 3 Feet Assistive device: None       General Gait Details: Pivotal steps from bed to chair, min guard for safety, +2 lines  Stairs            Wheelchair Mobility    Modified Rankin (Stroke Patients Only)       Balance Overall balance assessment: Mild deficits observed, not formally tested                                           Pertinent Vitals/Pain Pain Assessment: No/denies pain    Home Living Family/patient expects to be discharged to:: Private residence Living Arrangements: Alone Available Help at Discharge: Other (Comment) (has support through [facility] group) Type of Home: Other(Comment) (motel)         Home Equipment: None      Prior Function Level of Independence: Independent         Comments: Working as Public affairs consultant at [employer] Reports last 8-12 weeks has been difficult to walk or stand for long periods     Hand Dominance        Extremity/Trunk Assessment   Upper Extremity Assessment Upper Extremity Assessment: Defer to OT evaluation    Lower Extremity Assessment Lower Extremity Assessment: RLE deficits/detail;LLE deficits/detail RLE Deficits / Details:  Increased edema distally. At least grossly 4/5 strength LLE Deficits / Details: Increased edema distally. At least 4/5 strength    Cervical / Trunk Assessment Cervical / Trunk Assessment: Normal  Communication   Communication: No difficulties  Cognition Arousal/Alertness: Awake/alert Behavior During Therapy: WFL for tasks assessed/performed Overall Cognitive Status: Within Functional Limits for tasks assessed                                         General Comments      Exercises General Exercises - Lower Extremity Ankle Circles/Pumps: AROM;Both;20 reps;Supine Quad Sets: Both;15 reps;Supine Long Arc Quad: Both;10 reps;Seated Heel Slides: Both;10 reps;Seated   Assessment/Plan    PT Assessment Patient needs continued PT services  PT Problem List Decreased strength;Decreased activity tolerance;Decreased balance;Decreased mobility;Cardiopulmonary status limiting activity       PT Treatment Interventions DME instruction;Gait training;Stair training;Functional mobility training;Therapeutic activities;Therapeutic exercise;Balance training;Patient/family education    PT Goals (Current goals can be found in the Care Plan section)  Acute Rehab PT Goals Patient Stated Goal: "move more." PT Goal Formulation: With patient Time For Goal Achievement: 01/05/21 Potential to Achieve Goals: Good    Frequency Min 3X/week   Barriers to discharge        Co-evaluation               AM-PAC PT "6 Clicks" Mobility  Outcome Measure Help needed turning from your back to your side while in a flat bed without using bedrails?: None Help needed moving from lying on your back to sitting on the side of a flat bed without using bedrails?: A Little Help needed moving to and from a bed to a chair (including a wheelchair)?: A Little Help needed standing up from a chair using your arms (e.g., wheelchair or bedside chair)?: A Little Help needed to walk in hospital room?: A Little Help needed climbing 3-5 steps with a railing? : Total 6 Click Score: 17    End of Session   Activity Tolerance: Patient tolerated treatment well Patient left: in chair;with call bell/phone within reach Nurse Communication: Mobility status PT Visit Diagnosis: Unsteadiness on feet (R26.81);Difficulty in walking, not elsewhere classified (R26.2)    Time: 9604-5409 PT Time Calculation (min) (ACUTE ONLY): 28 min   Charges:   PT  Evaluation $PT Eval Moderate Complexity: 1 Mod PT Treatments $Therapeutic Activity: 8-22 mins        Lillia Pauls, PT, DPT Acute Rehabilitation Services Pager [phone] Office [phone]   Norval Morton 12/22/2020, 4:32 PM

## 2020-12-22 NOTE — Progress Notes (Addendum)
Advanced Heart Failure Rounding Note   Subjective:    10/11 Impella 5.5 plaed  Impella 5.5 placed yesterday. P-6 flowing 3.6. Waveforms ok.  Had bleeding at insertion site yesterday. Pressure held. Systemic heparin off. Remains in purge.  On NE and milrinone 0.5  Co-ox 52%   Swan  RA 13 PA 43/26 Thermo 4/1.9 SVR 1283    Objective:   Weight Range:  Vital Signs:   Temp:  [97.5 F (36.4 C)-99.1 F (37.3 C)] 99 F (37.2 C) (10/12 0730) Pulse Rate:  [105-122] 113 (10/12 0730) Resp:  [10-23] 16 (10/12 0730) BP: (81-116)/(61-89) 87/74 (10/12 0730) SpO2:  [92 %-100 %] 96 % (10/12 0730) Arterial Line BP: (83-103)/(67-90) 85/69 (10/11 1600) Weight:  [93.3 kg] 93.3 kg (10/12 0530) Last BM Date:  (prior to admit)  Weight change: Filed Weights   12/19/20 1000 12/21/20 0600 12/22/20 0530  Weight: 86.2 kg 85.1 kg 93.3 kg    Intake/Output:   Intake/Output Summary (Last 24 hours) at 12/22/2020 0755 Last data filed at 12/22/2020 0700 Gross per 24 hour  Intake 4179 ml  Output 2280 ml  Net 1899 ml    Physical exam: General:  Sitting up in bed . No resp difficulty HEENT: normal Neck: supple. RIJ swan Carotids 2+ bilat; no bruits. No lymphadenopathy or thryomegaly appreciated. Cor: PMI nondisplaced. Tachy regular . + R ax impella with pressure dressing  Lungs: clear Abdomen: soft, nontender, nondistended. No hepatosplenomegaly. No bruits or masses. Good bowel sounds. Extremities: no cyanosis, clubbing, rash, edema Neuro: alert & orientedx3, cranial nerves grossly intact. moves all 4 extremities w/o difficulty. Affect pleasant   Telemetry: Sinus Tach 100-110s Personally reviewed  Labs: Basic Metabolic Panel: Recent Labs  Lab 12/16/20 0634 12/17/20 0457 12/17/20 1423 12/18/20 0330 12/19/20 0500 12/19/20 1942 12/20/20 0749 12/21/20 0342 12/21/20 0916 12/22/20 0410 12/22/20 0415  NA 135 134*   < > 133* 128* 126* 127* 126* 126* 123* 122*  K 4.7 4.7   < >  3.6 3.5 3.7 3.7 4.3 4.4 4.9 4.8  CL 104 100  --  99 94* 91* 92* 92* 91* 91*  --   CO2 19* 18*  --  23 26 23 25 23   --  24  --   GLUCOSE 161* 151*  --  158* 144* 181* 169* 173* 217* 187*  --   BUN 32* 49*  --  36* 21* 20 17 15 15 11   --   CREATININE 1.83* 2.66*  --  1.77* 1.44* 1.26* 1.18 1.12 1.10 1.14  --   CALCIUM 8.9 8.7*  --  8.2* 8.0* 8.2* 8.4* 8.5*  --  8.4*  --   MG 2.2 2.3  --  2.0 1.8 2.1  --   --   --   --   --    < > = values in this interval not displayed.     Liver Function Tests: Recent Labs  Lab 12/16/20 0634 12/17/20 0457 12/18/20 0330 12/19/20 0500 12/20/20 0749  AST 404* 1,377* 678* 603* 311*  ALT 443* 1,248* 1,118* 1,084* 834*  ALKPHOS 36* 37* 36* 36* 36*  BILITOT 4.0* 4.7* 4.2* 3.1* 2.9*  PROT 6.0* 6.0* 5.5* 5.3* 5.6*  ALBUMIN 3.7 3.6 3.2* 3.0* 3.2*    Recent Labs  Lab 12/15/20 1716  LIPASE 73*    No results for input(s): AMMONIA in the last 168 hours.  CBC: Recent Labs  Lab 12/15/20 1716 12/16/20 02/14/21 12/19/20 0500 12/20/20 0428 12/21/20 0342 12/21/20 0916 12/21/20  1528 12/22/20 0410 12/22/20 0415  WBC 7.8   < > 4.9 4.6 4.7  --  7.1 8.7  --   NEUTROABS 5.2  --   --   --   --   --   --   --   --   HGB 15.5   < > 12.9* 12.7* 12.9* 13.3 12.3* 11.1* 11.9*  HCT 45.1   < > 36.3* 36.2* 37.8* 39.0 34.8* 32.7* 35.0*  MCV 85.6   < > 81.9 81.7 82.4  --  81.7 83.8  --   PLT 168   < > 110* 93* 84*  --  90* 79*  --    < > = values in this interval not displayed.     Cardiac Enzymes: Recent Labs  Lab 12/17/20 1122  CKTOTAL 149  CKMB 3.2     BNP: BNP (last 3 results) Recent Labs    12/15/20 1716 12/17/20 0457  BNP 2,024.7* 1,742.2*     ProBNP (last 3 results) No results for input(s): PROBNP in the last 8760 hours.    Other results:  Imaging: DG Chest 1 View  Result Date: 12/21/2020 CLINICAL DATA:  Placement of Impella device. EXAM: CHEST  1 VIEW; DG C-ARM 1-60 MIN-NO REPORT Radiation exposure index: 19.83 mGy. COMPARISON:   December 20, 2020. FINDINGS: One intraoperative fluoroscopic image of the left chest was obtained. This demonstrates an Impella device projected over the cardiac silhouette. IMPRESSION: Fluoroscopic guidance provided during Impella device placement. Electronically Signed   By: Lupita Raider M.D.   On: 12/21/2020 12:39   DG CHEST PORT 1 VIEW  Result Date: 12/20/2020 CLINICAL DATA:  CHF (congestive heart failure) (HCC) I50.9 (ICD-10-CM) EXAM: PORTABLE CHEST 1 VIEW COMPARISON:  December 17, 2020. FINDINGS: Enlarged cardiac silhouette, similar. Swan-Ganz catheter tip projects in the expected region of the main pulmonary artery. Similar positioning of a left IJ approach central venous catheter with the tip projecting at the superior cavoatrial junction. No consolidation. No visible pleural effusions or pneumothorax. IMPRESSION: Similar marked cardiomegaly. No evidence of new acute cardiopulmonary disease. Electronically Signed   By: Feliberto Harts M.D.   On: 12/20/2020 13:02   DG Abd Portable 1V  Result Date: 12/20/2020 CLINICAL DATA:  Ileus (HCC) K56.7 (ICD-10-CM) EXAM: PORTABLE ABDOMEN - 1 VIEW COMPARISON:  CT abdomen/pelvis December 16, 2020. FINDINGS: Gas with nondilated colon with paucity of small bowel gas. No dilated bowel loops to suggest obstruction. No obvious evidence of free air on this supine radiograph. No abnormal calcifications or mass effect. Lower lumbar degenerative change. IMPRESSION: Gas with nondilated colon with paucity of small bowel gas. No dilated bowel loops to suggest obstruction. Electronically Signed   By: Feliberto Harts M.D.   On: 12/20/2020 10:54   DG C-Arm 1-60 Min-No Report  Result Date: 12/21/2020 Fluoroscopy was utilized by the requesting physician.  No radiographic interpretation.     Medications:     Scheduled Medications:  aspirin EC  81 mg Oral Daily   Chlorhexidine Gluconate Cloth  6 each Topical Daily   digoxin  0.125 mg Oral Daily   docusate sodium   100 mg Oral BID   mouth rinse  15 mL Mouth Rinse BID   pantoprazole  40 mg Oral Daily   polyethylene glycol  17 g Oral Daily   potassium chloride  40 mEq Oral BID   sodium chloride flush  10-40 mL Intracatheter Q12H   tamsulosin  0.4 mg Oral QPC supper    Infusions:  sodium chloride 10 mL/hr at 12/22/20 0700   heparin 50 units/mL (Impella PURGE) in dextrose 5 % 1000 mL bag 10.9 mL/hr at 12/21/20 1100   milrinone 0.5 mcg/kg/min (12/22/20 0700)   norepinephrine (LEVOPHED) Adult infusion 16 mcg/min (12/22/20 0700)    PRN Medications: sodium chloride, bisacodyl, fentaNYL (SUBLIMAZE) injection, lip balm, ondansetron **OR** ondansetron (ZOFRAN) IV, oxyCODONE, sodium chloride flush   Assessment/Plan:   1. 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 Will need cath +/- cMRI when more stable - Impella 5.5 placed 10/11 - Remains on milrinone 0.5 mcg + norepi 16 mcg. Swan numbers still marginal - Continue digoxin 0.125  - Will ned to push Impella flow and try to wean inotropes. Watch RV. LDH 226 - Given social situation advanced therapies would be challenging but if he does not recover with medical therapy, he has extensive support from [facility]. We will work to get him Medicaid to keep transplant option on the table for him. Not VAD candidate with severe RV dysfunction - HCV quant is negative  - Blood type B+ - Dr Gala Romney spoke to Dr. Edwena Blow at [facility]. They would consider him for transplant evaluation (will need Medicaid first).    2. Acute PE  - CTA + bilateral segmental/subsegmental PE likely due to low output - Continue Heparin drip  - PLTs 80-90k  - HIT panel negative.  - SRA pending.    3. Acute blood loss anemia - bleeding from Impella site on 10/11 - heparin off  hgb 12.9 -> 11.1 - can restart low-dose heparin today   4. Shock liver - improving with hemodynamic support   5. AKI on CKD 3B - baseline SCr 1.8-2.0 - due to ATN from shock  - Creatinine normalized.   6. Thrombocytopenia - PLTs dropping. 84 -> 79 today  -  HIT panel negative.  - SRA pending.   - can switch to bival as needed  7. HCV - viral RNA Quant negative   8. 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 Hyponatremia - sodium 122 - give tolvaptan - make sure drips are NS and not d5 1/2NS   10. [facility] needs - I spoke with his caseworker April Anders [phone]). He was incarcerated almost a decade ago but not since. He has been homeless for years but recently moved into a hotel in June through the [facility]. 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 No substance use. Always first to volunteer to help other [employer] members move and do other tasks. Has a sister out of state but doesn't her to be contacted until he is improving. [employer] members are very attached to him and said they would drive him back and forth to [facility] for transplant eval,if needed. Other IRC contact: Brother John: [phone]   CRITICAL CARE Performed by: Arvilla Meres  Total critical care time: 45 minutes  Critical care time was exclusive of separately billable procedures and treating other patients.  Critical care was necessary to treat or prevent imminent or life-threatening deterioration.  Critical care was time spent  personally by me (independent of midlevel providers or residents) on the following activities: development of treatment plan with patient and/or surrogate as well as nursing, discussions with consultants, evaluation of patient's response to treatment, examination of patient, obtaining history from patient or  surrogate, ordering and performing treatments and interventions, ordering and review of laboratory studies, ordering and review of radiographic studies, pulse oximetry and re-evaluation of patient's condition.    Length of Stay: 6   Arvilla Meres MD 12/22/2020, 7:55 AM  Advanced Heart Failure Team Pager [phone] (M-F; 7a - 4p)  Please contact [facility] for night-coverage after hours (4p -7a ) and weekends on [email or web address]

## 2020-12-22 NOTE — Progress Notes (Signed)
ANTICOAGULATION CONSULT NOTE - Follow Up Consult  Pharmacy Consult for Heparin Indication: Impella/ pulmonary embolus  No Known Allergies  Patient Measurements: Height: 6\' 1"  (185.4 cm) Weight: 93.3 kg (205 lb 11 oz) (weighed x 2, bed without extra items) IBW/kg (Calculated) : 79.9 Heparin Dosing Weight: 92.1 kg  Vital Signs: Temp: 99 F (37.2 C) (10/12 1000) Temp Source: Core (10/12 0400) BP: 97/81 (10/12 1000) Pulse Rate: 111 (10/12 1000)  Labs: Recent Labs    12/21/20 0342 12/21/20 0916 12/21/20 1528 12/22/20 0410 12/22/20 0415  HGB 12.9* 13.3 12.3* 11.1* 11.9*  HCT 37.8* 39.0 34.8* 32.7* 35.0*  PLT 84*  --  90* 79*  --   HEPARINUNFRC 0.38  --  >1.10* <0.10*  --   CREATININE 1.12 1.10  --  1.14  --      Estimated Creatinine Clearance: 86.6 mL/min (by C-G formula based on SCr of 1.14 mg/dL).   Medications:  Infusions:   sodium chloride 10 mL/hr at 12/22/20 0700   heparin 50 units/mL (Impella PURGE) in dextrose 5 % 1000 mL bag 10.9 mL/hr at 12/21/20 1100   heparin     milrinone 0.5 mcg/kg/min (12/22/20 0700)   norepinephrine (LEVOPHED) Adult infusion 2 mcg/min (12/22/20 0945)      Assessment: 50 yo male with acute bilateral PE, moderate clot burden. Pharmacy consulted to dose heparin drip for PE.  No prior AC noted.  Patient returned from surgery 10/11 s/p impella 5.5 placement. Heparin level therapeutic this morning at <0.1 after systemic heparin held. Now, heparin purge solution flowing through impella at a rate of 11.7 mL/hr (585 units/hour). Concentration of purge solution is 50 units/mL. Systemic heparin to be restarted per heart failure team at a low rate of 300 units/hour with plan to stay on the lower end of heparin level goal. Hgb stable, platelets continue to drift down to 79. HIT antibody negative, SRA pending. Monitor for further platelet decline and adjust system heparin as indicated. Patient had a hematoma at the impella site yesterday, resolving  after 30 minutes of pressure. Also experienced art line bleeding. No signs of bleeding today.    Goal of Therapy:  Heparin level 0.3-0.5 units/ml for Impella + PE Monitor platelets by anticoagulation protocol: Yes   Plan:  Restart IV heparin infusion at 300 units/hr Impella purge rate 585 units/hour  Heparin level in 6 hours Daily heparin level and CBC Monitor for signs of bleeding   Thank you for allowing pharmacy to participate in this patient's care.  12/11, PharmD PGY1 Pharmacy Resident 12/22/2020 10:23 AM Check [email or web address] for unit specific pharmacy number

## 2020-12-22 NOTE — Progress Notes (Signed)
PT Cancellation Note  Patient Details Name: Dylan Chaney MRN: [id number] DOB: 21-Nov-1970   Cancelled Treatment:    Reason Eval/Treat Not Completed: Other (comment) Discussed with RN; pt recently received a bath and is very fatigued and sleeping. Will check back.  Lillia Pauls, PT, DPT Acute Rehabilitation Services Pager [phone] Office [phone]    Norval Morton 12/22/2020, 2:30 PM

## 2020-12-22 NOTE — Progress Notes (Signed)
1 Day Post-Op Procedure(s) (LRB): PLACEMENT OF IMPELLA LEFT VENTRICULAR ASSIST DEVICE (N/A) Subjective: Some incisional pain, general malaise  Objective: Vital signs in last 24 hours: Temp:  [97.5 F (36.4 C)-99.1 F (37.3 C)] 99 F (37.2 C) (10/12 0730) Pulse Rate:  [105-122] 113 (10/12 0730) Cardiac Rhythm: Sinus tachycardia (10/12 0500) Resp:  [10-23] 16 (10/12 0730) BP: (81-116)/(61-89) 87/74 (10/12 0730) SpO2:  [92 %-100 %] 96 % (10/12 0730) Arterial Line BP: (83-103)/(67-90) 85/69 (10/11 1600) Weight:  [93.3 kg] 93.3 kg (10/12 0530)  Hemodynamic parameters for last 24 hours: PAP: (38-53)/(20-35) 47/30 CVP:  [6 mmHg-22 mmHg] 22 mmHg CO:  [3.1 L/min-4.8 L/min] 4.8 L/min CI:  [1.5 L/min/m2-2.3 L/min/m2] 2.3 L/min/m2  Intake/Output from previous day: 10/11 0701 - 10/12 0700 In: 4179 [P.O.:840; I.V.:2554.5; IV Piggyback:558.6] Out: 2280 [Urine:1780; Blood:500] Intake/Output this shift: Total I/O In: 10.8 [Other:10.8] Out: -   General appearance: alert, cooperative, and no distress Neurologic: intact Wound: minimal blood on dressing, + swelling in surrounding tissues  Lab Results: Recent Labs    12/21/20 1528 12/22/20 0410 12/22/20 0415  WBC 7.1 8.7  --   HGB 12.3* 11.1* 11.9*  HCT 34.8* 32.7* 35.0*  PLT 90* 79*  --    BMET:  Recent Labs    12/21/20 0342 12/21/20 0916 12/22/20 0410 12/22/20 0415  NA 126* 126* 123* 122*  K 4.3 4.4 4.9 4.8  CL 92* 91* 91*  --   CO2 23  --  24  --   GLUCOSE 173* 217* 187*  --   BUN 15 15 11   --   CREATININE 1.12 1.10 1.14  --   CALCIUM 8.5*  --  8.4*  --     PT/INR: No results for input(s): LABPROT, INR in the last 72 hours. ABG    Component Value Date/Time   PHART 7.406 12/22/2020 0415   HCO3 21.8 12/22/2020 0415   TCO2 23 12/22/2020 0415   ACIDBASEDEF 2.0 12/22/2020 0415   O2SAT 51.5 12/22/2020 0646   CBG (last 3)  No results for input(s): GLUCAP in the last 72 hours.  Assessment/Plan: S/P Procedure(s)  (LRB): PLACEMENT OF IMPELLA LEFT VENTRICULAR ASSIST DEVICE (N/A) - Cardiac index Ok at 2.3 with Impella 5.5 on P6 with 3.6L flow but co-ox still low at only 52%  On norepi and milrinone 0.5 mcg/kg/min  Now Impella on p9 with flow of 5.1  Hematoma at surgical site, Stable from yesterday PM  Thrombocytopenia- PLT 79K down slightly form 90K   LOS: 6 days    02/21/2021 12/22/2020

## 2020-12-23 DIAGNOSIS — E43 Unspecified severe protein-calorie malnutrition: Secondary | ICD-10-CM | POA: Insufficient documentation

## 2020-12-23 LAB — POCT I-STAT 7, (LYTES, BLD GAS, ICA,H+H)
Acid-Base Excess: 5 mmol/L — ABNORMAL HIGH (ref 0.0–2.0)
Bicarbonate: 28.7 mmol/L — ABNORMAL HIGH (ref 20.0–28.0)
Calcium, Ion: 1.09 mmol/L — ABNORMAL LOW (ref 1.15–1.40)
HCT: 33 % — ABNORMAL LOW (ref 39.0–52.0)
Hemoglobin: 11.2 g/dL — ABNORMAL LOW (ref 13.0–17.0)
O2 Saturation: 79 %
Patient temperature: 98.8
Potassium: 4.3 mmol/L (ref 3.5–5.1)
Sodium: 130 mmol/L — ABNORMAL LOW (ref 135–145)
TCO2: 30 mmol/L (ref 22–32)
pCO2 arterial: 39.5 mmHg (ref 32.0–48.0)
pH, Arterial: 7.47 — ABNORMAL HIGH (ref 7.350–7.450)
pO2, Arterial: 40 mmHg — CL (ref 83.0–108.0)

## 2020-12-23 LAB — CBC
HCT: 31.4 % — ABNORMAL LOW (ref 39.0–52.0)
Hemoglobin: 11.1 g/dL — ABNORMAL LOW (ref 13.0–17.0)
MCH: 29 pg (ref 26.0–34.0)
MCHC: 35.4 g/dL (ref 30.0–36.0)
MCV: 82 fL (ref 80.0–100.0)
Platelets: 74 10*3/uL — ABNORMAL LOW (ref 150–400)
RBC: 3.83 MIL/uL — ABNORMAL LOW (ref 4.22–5.81)
RDW: 13.7 % (ref 11.5–15.5)
WBC: 11 10*3/uL — ABNORMAL HIGH (ref 4.0–10.5)
nRBC: 0 % (ref 0.0–0.2)

## 2020-12-23 LAB — COOXEMETRY PANEL
Carboxyhemoglobin: 1.9 % — ABNORMAL HIGH (ref 0.5–1.5)
Carboxyhemoglobin: 2 % — ABNORMAL HIGH (ref 0.5–1.5)
Carboxyhemoglobin: 2 % — ABNORMAL HIGH (ref 0.5–1.5)
Methemoglobin: 0.9 % (ref 0.0–1.5)
Methemoglobin: 0.9 % (ref 0.0–1.5)
Methemoglobin: 0.9 % (ref 0.0–1.5)
O2 Saturation: 71.2 %
O2 Saturation: 58.2 %
O2 Saturation: 53.3 %
Total hemoglobin: 11.2 g/dL — ABNORMAL LOW (ref 12.0–16.0)
Total hemoglobin: 11.1 g/dL — ABNORMAL LOW (ref 12.0–16.0)
Total hemoglobin: 11.9 g/dL — ABNORMAL LOW (ref 12.0–16.0)

## 2020-12-23 LAB — SEROTONIN RELEASE ASSAY (SRA)
SRA .2 IU/mL UFH Ser-aCnc: <1 % (ref 0–20)
SRA 100IU/mL UFH Ser-aCnc: <1 % (ref 0–20)

## 2020-12-23 LAB — HEPARIN LEVEL (UNFRACTIONATED)
Heparin Unfractionated: <0.1 IU/mL — ABNORMAL LOW (ref 0.30–0.70)
Heparin Unfractionated: <0.1 IU/mL — ABNORMAL LOW (ref 0.30–0.70)
Heparin Unfractionated: <0.1 IU/mL — ABNORMAL LOW (ref 0.30–0.70)

## 2020-12-23 LAB — BASIC METABOLIC PANEL
Anion gap: 9 (ref 5–15)
BUN: 12 mg/dL (ref 6–20)
CO2: 28 mmol/L (ref 22–32)
Calcium: 8.6 mg/dL — ABNORMAL LOW (ref 8.9–10.3)
Chloride: 95 mmol/L — ABNORMAL LOW (ref 98–111)
Creatinine, Ser: 1.09 mg/dL (ref 0.61–1.24)
GFR, Estimated: >60 mL/min (ref >60)
Glucose, Bld: 223 mg/dL — ABNORMAL HIGH (ref 70–99)
Potassium: 4.3 mmol/L (ref 3.5–5.1)
Sodium: 132 mmol/L — ABNORMAL LOW (ref 135–145)

## 2020-12-23 LAB — GLUCOSE, CAPILLARY
Glucose-Capillary: 268 mg/dL — ABNORMAL HIGH (ref 70–99)
Glucose-Capillary: 282 mg/dL — ABNORMAL HIGH (ref 70–99)
Glucose-Capillary: 244 mg/dL — ABNORMAL HIGH (ref 70–99)
Glucose-Capillary: 205 mg/dL — ABNORMAL HIGH (ref 70–99)

## 2020-12-23 LAB — HEMOGLOBIN A1C
Hgb A1c MFr Bld: 6.5 % — ABNORMAL HIGH (ref 4.8–5.6)
Mean Plasma Glucose: 139.85 mg/dL

## 2020-12-23 LAB — LACTATE DEHYDROGENASE: LDH: 290 U/L — ABNORMAL HIGH (ref 98–192)

## 2020-12-23 LAB — MAGNESIUM: Magnesium: 2.4 mg/dL (ref 1.7–2.4)

## 2020-12-23 MED ORDER — ACETAMINOPHEN 325 MG PO TABS
650.0000 mg | ORAL_TABLET | Freq: Four times a day (QID) | ORAL | Status: DC | PRN
  Administered 2020-12-23 – 2020-12-24 (×3): 650 mg via ORAL
  Filled 2020-12-23 (×3): qty 2

## 2020-12-23 MED ORDER — BOOST / RESOURCE BREEZE PO LIQD CUSTOM
1.0000 | Freq: Three times a day (TID) | ORAL | Status: DC
Start: 2020-12-23 — End: 2021-01-06
  Administered 2020-12-23 – 2021-01-05 (×14): 1 via ORAL

## 2020-12-23 MED ORDER — INSULIN ASPART 100 UNIT/ML IJ SOLN
0.0000 [IU] | Freq: Three times a day (TID) | INTRAMUSCULAR | Status: DC
  Administered 2020-12-23: 3 [IU] via SUBCUTANEOUS
  Administered 2020-12-23: 5 [IU] via SUBCUTANEOUS
  Administered 2020-12-24: 3 [IU] via SUBCUTANEOUS
  Administered 2020-12-24 (×2): 2 [IU] via SUBCUTANEOUS
  Administered 2020-12-25: 7 [IU] via SUBCUTANEOUS
  Administered 2020-12-25: 3 [IU] via SUBCUTANEOUS
  Administered 2020-12-25: 5 [IU] via SUBCUTANEOUS
  Administered 2020-12-26: 9 [IU] via SUBCUTANEOUS
  Administered 2020-12-26: 5 [IU] via SUBCUTANEOUS
  Administered 2020-12-26: 3 [IU] via SUBCUTANEOUS
  Administered 2020-12-27: 2 [IU] via SUBCUTANEOUS
  Administered 2020-12-27: 3 [IU] via SUBCUTANEOUS
  Administered 2020-12-27 – 2020-12-28 (×2): 2 [IU] via SUBCUTANEOUS

## 2020-12-23 MED ORDER — POTASSIUM CHLORIDE CRYS ER 20 MEQ PO TBCR
40.0000 meq | EXTENDED_RELEASE_TABLET | Freq: Once | ORAL | Status: AC
  Administered 2020-12-23: 40 meq via ORAL
  Filled 2020-12-23: qty 2

## 2020-12-23 MED ORDER — FUROSEMIDE 10 MG/ML IJ SOLN
40.0000 mg | Freq: Three times a day (TID) | INTRAMUSCULAR | Status: AC
Start: 2020-12-23 — End: 2020-12-23
  Administered 2020-12-23 (×2): 40 mg via INTRAVENOUS
  Filled 2020-12-23 (×2): qty 4

## 2020-12-23 MED ORDER — PROSOURCE PLUS PO LIQD
30.0000 mL | Freq: Two times a day (BID) | ORAL | Status: DC
  Administered 2020-12-24 – 2020-12-28 (×9): 30 mL via ORAL
  Filled 2020-12-23 (×9): qty 30

## 2020-12-23 MED ORDER — INSULIN ASPART 100 UNIT/ML IJ SOLN
0.0000 [IU] | Freq: Every day | INTRAMUSCULAR | Status: DC
  Administered 2020-12-23: 2 [IU] via SUBCUTANEOUS
  Administered 2020-12-25: 3 [IU] via SUBCUTANEOUS
  Administered 2020-12-27 – 2021-01-05 (×5): 2 [IU] via SUBCUTANEOUS

## 2020-12-23 MED ORDER — OXYCODONE HCL 5 MG PO TABS
5.0000 mg | ORAL_TABLET | Freq: Two times a day (BID) | ORAL | Status: DC | PRN
Start: 2020-12-23 — End: 2021-01-06
  Administered 2020-12-23 – 2021-01-05 (×12): 5 mg via ORAL
  Filled 2020-12-23 (×12): qty 1

## 2020-12-23 NOTE — Progress Notes (Signed)
Initial Nutrition Assessment  DOCUMENTATION CODES:   Severe malnutrition in context of chronic illness  INTERVENTION:  Boost Breeze po TID, each supplement provides 250 kcal and 9 grams of protein  Prosource BID to provide, 100 calories and 115 grams of protein  Liberalize to a regular diet  NUTRITION DIAGNOSIS:   Severe Malnutrition related to chronic illness (PE, CHF, renal insufficiency) as evidenced by moderate fat depletion, moderate muscle depletion, percent weight loss, energy intake < 75% for > or equal to 1 month.  GOAL:   Patient will meet greater than or equal to 90% of their needs  MONITOR:   PO intake, Supplement acceptance, Weight trends  REASON FOR ASSESSMENT:   Rounds (poor PO intake)    ASSESSMENT:   Pt admitted 10/6 with SOB, chest and abdominal pain x3 months . 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 PMH includes remote drug and alcohol abuse, prostate enlargement, and renal insufficiency.  107: Echocardiogram with sever systolic dysfunction, patient clinically in cardiogenic shock 10/11: Impella device placement  Noted plans to transfer for heart transplant. Pt is uninsured. Care plan working on getting pt on Medicaid.  Pt lives in a hotel and usually walks to work at [employer] and was previously very active. Within the past 3-4 months, he has been taking a cab to work because he has not had the energy to walk. For meals he will usually eat at a fast food restaurant or at the [facility]. Within those 3-4 months, he has been eating significantly less than normal d/t inability to walk, experiencing a tightness in his chest, and SOB. He had been experiencing intermittent emesis. He previously has tried ensure products but states that he cannot tolerate it well, however is willing to receive Boost Breeze and trying Prosource. He states that his appetite is beginning to improve  but is still not at baseline. Spoke with MD about liberalizing diet to increase meal options to enhance PO intake.  Meal Completion:  10/9: 15% x 1 recorded meal 10/10: 75% x 1 recorded meal 10/11: 25% x 1 recorded meal  Pt reports usual weight of 230 lbs. His current weight prior to impella placement was 188 lbs. This is an 18% weight loss. Noted presence of edema, suspect some of current weight could be d/t fluid.   Medications: colace, lasix, SSI, protonix, levophed  Labs: sodium 130, CBG 187-223 x24 hours, A1C 6.5  NUTRITION - FOCUSED PHYSICAL EXAM:  Flowsheet Row Most Recent Value  Orbital Region Moderate depletion  Upper Arm Region Moderate depletion  Thoracic and Lumbar Region Moderate depletion  Buccal Region Moderate depletion  Temple Region Moderate depletion  Clavicle Bone Region Moderate depletion  Clavicle and Acromion Bone Region Moderate depletion  Scapular Bone Region Moderate depletion  Dorsal Hand Moderate depletion  Patellar Region Mild depletion  Anterior Thigh Region Mild depletion  Posterior Calf Region Unable to assess  [LE edema]  Edema (RD Assessment) Mild  Hair Reviewed  Eyes Reviewed  Mouth Other (Comment)  [ulceration on upper lip]  Skin Reviewed  Nails Reviewed       Diet Order:   Diet Order             Diet regular Room service appropriate? Yes; Fluid consistency: Thin  Diet effective now                   EDUCATION NEEDS:   Education needs have  been addressed  Skin:  Skin Assessment: Skin Integrity Issues: Skin Integrity Issues:: Incisions Incisions: R chest  Last BM:  PTA  Height:   Ht Readings from Last 1 Encounters:  12/17/20 6\' 1"  (1.854 m)   Weight:   Wt Readings from Last 1 Encounters:  12/23/20 89.4 kg    BMI:  Body mass index is 26 kg/m.  Estimated Nutritional Needs:   Kcal:  2400-2700  Protein:  120-135  Fluid:  >2.4L  12/25/20, RDN, LDN Clinical Nutrition

## 2020-12-23 NOTE — Plan of Care (Signed)
  Problem: Clinical Measurements: Goal: Will remain free from infection Outcome: Progressing   Problem: Clinical Measurements: Goal: Cardiovascular complication will be avoided Outcome: Progressing   Problem: Activity: Goal: Risk for activity intolerance will decrease Outcome: Progressing   Problem: Nutrition: Goal: Adequate nutrition will be maintained Outcome: Progressing   Problem: Activity: Goal: Capacity to carry out activities will improve Outcome: Progressing

## 2020-12-23 NOTE — Progress Notes (Signed)
ANTICOAGULATION CONSULT NOTE - Follow Up Consult  Pharmacy Consult for Heparin Indication: Impella/ pulmonary embolus  No Known Allergies  Patient Measurements: Height: 6\' 1"  (185.4 cm) Weight: 89.4 kg (197 lb 1.5 oz) IBW/kg (Calculated) : 79.9 Heparin Dosing Weight: 92.1 kg  Vital Signs: Temp: 98.7 F (37.1 C) (10/13 1100) BP: 93/82 (10/13 1930) Pulse Rate: 118 (10/13 1930)  Labs: Recent Labs    12/21/20 1528 12/22/20 0410 12/22/20 0415 12/22/20 1322 12/22/20 1846 12/23/20 0408 12/23/20 0435 12/23/20 1119 12/23/20 1800  HGB 12.3* 11.1* 11.9*  --   --  11.1* 11.2*  --   --   HCT 34.8* 32.7* 35.0*  --   --  31.4* 33.0*  --   --   PLT 90* 79*  --   --   --  74*  --   --   --   HEPARINUNFRC >1.10* <0.10*  --   --    < > <0.10*  --  <0.10* <0.10*  CREATININE  --  1.14  --  1.08  --  1.09  --   --   --    < > = values in this interval not displayed.     Estimated Creatinine Clearance: 90.6 mL/min (by C-G formula based on SCr of 1.09 mg/dL).   Medications:  Infusions:   sodium chloride Stopped (12/23/20 0405)   heparin 50 units/mL (Impella PURGE) in dextrose 5 % 1000 mL bag 10.9 mL/hr at 12/21/20 1100   heparin 750 Units/hr (12/23/20 1900)   milrinone 0.5 mcg/kg/min (12/23/20 1900)   norepinephrine (LEVOPHED) Adult infusion 19 mcg/min (12/23/20 1900)      Assessment: 50 yo male with acute bilateral PE, moderate clot burden. Pharmacy consulted to dose heparin drip for PE.  No prior AC noted.  Patient returned from surgery 10/11 s/p impella 5.5 placement. Heparin level subtherapeutic this morning at <0.1 after a rate increase to 600 units/hr systemic heparin. Now, heparin purge solution flowing through impella at a rate of 11.6 mL/hr (580 units/hour). Concentration of purge solution is 50 units/mL.  Minor nose bleed overnight. Hematoma remains unchanged. Hgb stable, platelets started to stabilize 79>>74. HIT antibody negative, SRA negative. Monitor for further  platelet decline and adjust system heparin as indicated. Plan to stay on the lower end of heparin level goal. Will cautiously increase systemic heparin.  PM update: Heparin level undetectable on systemic heparin 750 units/hr and heparin in purge at 11.8 ml/hr (590 units/hr). Per RN, hematoma at impella site stable. No other bleeding noted.   Goal of Therapy:  Heparin level 0.3-0.5 units/ml for Impella + PE Monitor platelets by anticoagulation protocol: Yes   Plan:  Increase systemic heparin to 850 units/hr  Continue heparin in impella purge  Check 6 hr heparin level Monitor heparin level, CBC and s/s of bleeding daily   Thank you for allowing pharmacy to participate in this patient's care.  12/11, PharmD, BCPS Clinical Pharmacist 12/23/2020 7:45 PM

## 2020-12-23 NOTE — TOC Progression Note (Addendum)
Transition of Care (TOC) - Progression Note    Patient Details  Name: Dylan Chaney MRN: [id number] Date of Birth: 10/02/1970  Transition of Care (TOC) CM/SW Contact  Cortlin Polo, LCSWA Phone Number: 12/23/2020, 9:58 AM  Clinical Narrative:    8:18am - HF CSW received a return call from the [facility] and spoke about getting [name]'s disability application processed and started and they confirmed they had the application and are working on it.  10am - HF CSW met with John Thornton and [name] at bedside to discuss about family coming and continue discussion around advanced directives and living will. Awaiting family at bedside and CSW will return once family arrives.  CSW will reach out about Medicaid again today and see about an update.  1:05pm - HF CSW spoke with [name] and his sister and mother at bedside to discuss follow up conversations regarding advanced directives/living will. [name] provided permission for the CSW to outreach the Chaplain to set up advanced directives/living will. [name]'s sister, Bonnie shared contact information [phone]. CSW paged the Chaplain for assistance with advanced directives/living will. The chaplain, Amanda asked for the CSW to put in a consult for chaplain services. CSW reached out the the unit RNCM for assistance in putting in a consult for the chaplain.  CSW will continue to follow.   Expected Discharge Plan: Home w Home Health Services Barriers to Discharge: Continued Medical Work up  Expected Discharge Plan and Services Expected Discharge Plan: Home w Home Health Services In-house Referral: Clinical Social Work Discharge Planning Services: CM Consult Post Acute Care Choice: Home Health Living arrangements for the past 2 months: Hotel/Motel                                       Social Determinants of Health (SDOH) Interventions Financial Strain Interventions: Financial Counselor Housing  Interventions: Other (Comment) (Pt. reports living at a [facility] [address] [facility]) Transportation Interventions: [facility]  Readmission Risk Interventions No flowsheet data found.  Cortlin Polo, MSW, LCSWA [phone] Heart Failure Social Worker   

## 2020-12-23 NOTE — Progress Notes (Signed)
ANTICOAGULATION CONSULT NOTE - Follow Up Consult  Pharmacy Consult for Heparin Indication: Impella/ pulmonary embolus  No Known Allergies  Patient Measurements: Height: 6\' 1"  (185.4 cm) Weight: 89.4 kg (197 lb 1.5 oz) IBW/kg (Calculated) : 79.9 Heparin Dosing Weight: 92.1 kg  Vital Signs: Temp: 98.7 F (37.1 C) (10/13 1100) Temp Source: Oral (10/13 0430) BP: 88/75 (10/13 1200) Pulse Rate: 113 (10/13 1200)  Labs: Recent Labs    12/21/20 1528 12/22/20 0410 12/22/20 0415 12/22/20 1322 12/22/20 1846 12/23/20 0408 12/23/20 0435 12/23/20 1119  HGB 12.3* 11.1* 11.9*  --   --  11.1* 11.2*  --   HCT 34.8* 32.7* 35.0*  --   --  31.4* 33.0*  --   PLT 90* 79*  --   --   --  74*  --   --   HEPARINUNFRC >1.10* <0.10*  --   --  <0.10* <0.10*  --  <0.10*  CREATININE  --  1.14  --  1.08  --  1.09  --   --      Estimated Creatinine Clearance: 90.6 mL/min (by C-G formula based on SCr of 1.09 mg/dL).   Medications:  Infusions:   sodium chloride Stopped (12/23/20 0405)   heparin 50 units/mL (Impella PURGE) in dextrose 5 % 1000 mL bag 10.9 mL/hr at 12/21/20 1100   heparin 600 Units/hr (12/23/20 1300)   milrinone 0.5 mcg/kg/min (12/23/20 1300)   norepinephrine (LEVOPHED) Adult infusion 21 mcg/min (12/23/20 1300)      Assessment: 50 yo male with acute bilateral PE, moderate clot burden. Pharmacy consulted to dose heparin drip for PE.  No prior AC noted.  Patient returned from surgery 10/11 s/p impella 5.5 placement. Heparin level subtherapeutic this morning at <0.1 after a rate increase to 600 units/hr systemic heparin. Now, heparin purge solution flowing through impella at a rate of 11.6 mL/hr (580 units/hour). Concentration of purge solution is 50 units/mL.  Minor nose bleed overnight. Hematoma remains unchanged. Hgb stable, platelets started to stabilize 79>>74. HIT antibody negative, SRA negative. Monitor for further platelet decline and adjust system heparin as indicated. Plan to  stay on the lower end of heparin level goal. Will cautiously increase systemic heparin.  Goal of Therapy:  Heparin level 0.3-0.5 units/ml for Impella + PE Monitor platelets by anticoagulation protocol: Yes   Plan:  Restart IV heparin infusion at 750 units/hr Impella purge rate 580 units/hour  Heparin level at 1800 Daily heparin level and CBC Monitor for signs of bleeding   Thank you for allowing pharmacy to participate in this patient's care.  12/11, PharmD PGY1 Pharmacy Resident 12/23/2020 1:03 PM Check [email or web address] for unit specific pharmacy number

## 2020-12-23 NOTE — Progress Notes (Signed)
ANTICOAGULATION CONSULT NOTE - Follow Up Consult  Pharmacy Consult for heparin Indication:  Impella and PE  Labs: Recent Labs    12/21/20 1528 12/22/20 0410 12/22/20 0415 12/22/20 1322 12/22/20 1846 12/23/20 0408 12/23/20 0435  HGB 12.3* 11.1* 11.9*  --   --  11.1* 11.2*  HCT 34.8* 32.7* 35.0*  --   --  31.4* 33.0*  PLT 90* 79*  --   --   --  74*  --   HEPARINUNFRC >1.10* <0.10*  --   --  <0.10* <0.10*  --   CREATININE  --  1.14  --  1.08  --  1.09  --     Assessment: 50yo male remains subtherapeutic on heparin after rate change; no infusion issues or signs of bleeding per RN.  Goal of Therapy:  Heparin level 0.3-0.5 units/ml   Plan:  Will increase systemic heparin infusion by 1-2 units/kg/hr to 600 units/hr (in addition to purge at ~550 units/hr) and check level in 6 hours.    Vernard Gambles, PharmD, BCPS  12/23/2020,5:08 AM

## 2020-12-23 NOTE — Op Note (Signed)
[name], QUACKENBUSH MEDICAL RECORD NO: [id number] ACCOUNT NO: [id number] DATE OF BIRTH: Aug 23, 1970 FACILITY: [facility] LOCATION: [facility] PHYSICIAN: Salvatore Decent. Dorris Fetch, MD  Operative Report   DATE OF PROCEDURE: 12/21/2020   PREOPERATIVE DIAGNOSIS:  Cardiogenic shock.  POSTOPERATIVE DIAGNOSIS:  Cardiogenic shock.  PROCEDURE:  Placement of Impella 5.5 left ventricular assist device via right axillary artery.  SURGEON:  Salvatore Decent. Dorris Fetch, MD  COSURGEON:  Dr. Arvilla Meres.   ANESTHESIA: General.  FINDINGS:  Transesophageal echocardiography revealed severe left and right ventricular dysfunction.  Good placement of Impella device with flows of approximately 3.5 liters on P6.  CLINICAL NOTE:  [name] is a 50 year old gentleman who presented with chest pain and shortness of breath.  He was found to have bilateral pulmonary emboli.  Echocardiogram showed an ejection fraction of less than 20%.  He has persistent low  co-oximetry levels despite being on milrinone and norepinephrine drips and needs mechanical support as possible bridge to transplantation or less likely recovery.  The patient was offered the option of placement of an Impella ventricular assist device,  via a right axillary artery approach.  The indications, risks, benefits, and alternatives were discussed in detail with the patient.  He accepted the risks and agreed to proceed.  OPERATIVE NOTE:  Mr. [name] was brought to the operating room on 12/21/2020.  He was on milrinone and norepinephrine infusions.  Swan-Ganz catheter was in place, but had become dislodged during transport.  He was anesthetized and intubated, a new  Swan-Ganz catheter was placed by Dr. Chaney Malling of anesthesia.  The patient was given intravenous antibiotics and a Foley catheter was placed.  The chest and abdomen were prepped and draped in the usual sterile fashion.  Dr. Chaney Malling performed  transesophageal echocardiography.  Please see his separately  dictated note for full details.  There was severe left and right ventricular dysfunction.   A timeout was performed.  An incision was made in the right deltopectoral groove.  The deltopectoral fat pad was identified and dissection was carried down to the axillary artery, the brachial plexus was identified and no retraction was placed on the  brachial plexus.  The axillary artery was dissected out and loops were placed proximally and distally.  10,000 units of heparin was administered with a goal ACT of 250.  After 3 minutes, clamps were placed proximally and distally on the axillary artery.   An arteriotomy was made.  A 10 mm Gelweave graft was cut on a slight bevel and was anastomosed end-to-side to the axillary artery with a running 5-0 Prolene suture.  Clamps were removed and the graft was deaired.  There were several small bleeding sites  at the anastomosis that were sutured with additional 5-0 Prolene sutures.  There was good hemostasis at the anastomosis, the insertion attachment was placed into the graft and secured. the Impella 5.5 pump was prepared.  A J-wire was advanced and  fluoroscopy was used.  The J-wire was advanced to the level of the aortic valve.  The pigtail catheter then was placed and advanced easily into the left ventricle as confirmed by fluoroscopy and transesophageal echocardiography.  A wire then was placed  through the pigtail catheter and with a gentle bend along the anterior wall with good positioning.  The Impella device then was advanced over the wire and into the left ventricle, and was positioned with the tip approximately 4.5 cm from the aortic  valve.  The peel away insertion attachment was removed from the graft.  The  graft was clamped prior to doing that.  The catheter then was secured within the graft.  Support was initiated and at P6, there was approximately 3.5 liters of flow.  The wound  then was copiously irrigated with saline.  Surgicel was applied around the  anastomosis.  The wound then was closed in 3 layers.  The graft was incorporated in the wound at its lateral aspect.  The patient then was transported from the operating room to  the surgical intensive care unit.  He was extubated prior to transport.  All sponge, needle and instrument counts were correct at the end of the procedure.     SUJ D: 12/22/2020 6:22:13 pm T: 12/23/2020 2:17:00 am  JOB: [id number]/ [id number]

## 2020-12-23 NOTE — Progress Notes (Addendum)
Advanced Heart Failure Rounding Note   Subjective:    10/11 Impella 5.5 plaed 12/22/20 Impllea turned up to P9 and milrinone increased to 0.5 mcg. Given dose of tolvaptan.    Impella 5.5 P-9  Flow 5.2  Waveforms ok.  LDH 226>290   On NE 21 mcg and milrinone 0.5  Co-ox 71 %   Able to get OOB. Denies SOB.   Objective:   Weight Range:  Vital Signs:   Temp:  [98 F (36.7 C)-99.1 F (37.3 C)] 98 F (36.7 C) (10/13 0430) Pulse Rate:  [37-231] 115 (10/13 0630) Resp:  [11-28] 14 (10/13 0630) BP: (78-118)/(36-91) 105/79 (10/13 0630) SpO2:  [87 %-100 %] 95 % (10/13 0630) Weight:  [89.4 kg] 89.4 kg (10/13 0630) Last BM Date:  (prior to admit)  Weight change: Filed Weights   12/21/20 0600 12/22/20 0530 12/23/20 0630  Weight: 85.1 kg 93.3 kg 89.4 kg    Intake/Output:   Intake/Output Summary (Last 24 hours) at 12/23/2020 0706 Last data filed at 12/23/2020 0700 Gross per 24 hour  Intake 1310.34 ml  Output 8610 ml  Net -7299.66 ml  CVP 11 personally checked.  Physical exam: General: In bed.  No resp difficulty HEENT: normal Neck: supple. JVP 10-11. Carotids 2+ bilat; no bruits. No lymphadenopathy or thryomegaly appreciated. LIJ  Cor: PMI nondisplaced. Regular rate & rhythm. No rubs, or murmurs. +S3. R axillary impella  Lungs: clear Abdomen: soft, nontender, nondistended. No hepatosplenomegaly. No bruits or masses. Good bowel sounds. Extremities: no cyanosis, clubbing, rash, edema Neuro: alert & orientedx3, cranial nerves grossly intact. moves all 4 extremities w/o difficulty. Affect flat    Telemetry: Sinus Tach 110-120s   Labs: Basic Metabolic Panel: Recent Labs  Lab 12/17/20 0457 12/17/20 1423 12/18/20 0330 12/19/20 0500 12/19/20 1942 12/20/20 0749 12/21/20 0342 12/21/20 0916 12/22/20 0410 12/22/20 0415 12/22/20 1322 12/23/20 0408 12/23/20 0435  NA 134*   < > 133* 128* 126* 127* 126* 126* 123* 122* 127* 132* 130*  K 4.7   < > 3.6 3.5 3.7 3.7 4.3  4.4 4.9 4.8 5.2* 4.3 4.3  CL 100  --  99 94* 91* 92* 92* 91* 91*  --  94* 95*  --   CO2 18*  --  23 26 23 25 23   --  24  --  25 28  --   GLUCOSE 151*  --  158* 144* 181* 169* 173* 217* 187*  --  212* 223*  --   BUN 49*  --  36* 21* 20 17 15 15 11   --  11 12  --   CREATININE 2.66*  --  1.77* 1.44* 1.26* 1.18 1.12 1.10 1.14  --  1.08 1.09  --   CALCIUM 8.7*  --  8.2* 8.0* 8.2* 8.4* 8.5*  --  8.4*  --  8.2* 8.6*  --   MG 2.3  --  2.0 1.8 2.1  --   --   --   --   --  1.8  --   --    < > = values in this interval not displayed.    Liver Function Tests: Recent Labs  Lab 12/17/20 0457 12/18/20 0330 12/19/20 0500 12/20/20 0749  AST 1,377* 678* 603* 311*  ALT 1,248* 1,118* 1,084* 834*  ALKPHOS 37* 36* 36* 36*  BILITOT 4.7* 4.2* 3.1* 2.9*  PROT 6.0* 5.5* 5.3* 5.6*  ALBUMIN 3.6 3.2* 3.0* 3.2*   No results for input(s): LIPASE, AMYLASE in the last 168 hours.  No results for input(s): AMMONIA in the last 168 hours.  CBC: Recent Labs  Lab 12/20/20 0428 12/21/20 0342 12/21/20 0916 12/21/20 1528 12/22/20 0410 12/22/20 0415 12/23/20 0408 12/23/20 0435  WBC 4.6 4.7  --  7.1 8.7  --  11.0*  --   HGB 12.7* 12.9*   < > 12.3* 11.1* 11.9* 11.1* 11.2*  HCT 36.2* 37.8*   < > 34.8* 32.7* 35.0* 31.4* 33.0*  MCV 81.7 82.4  --  81.7 83.8  --  82.0  --   PLT 93* 84*  --  90* 79*  --  74*  --    < > = values in this interval not displayed.    Cardiac Enzymes: Recent Labs  Lab 12/17/20 1122  CKTOTAL 149  CKMB 3.2    BNP: BNP (last 3 results) Recent Labs    12/15/20 1716 12/17/20 0457  BNP 2,024.7* 1,742.2*    ProBNP (last 3 results) No results for input(s): PROBNP in the last 8760 hours.    Other results:  Imaging: DG Chest 1 View  Result Date: 12/21/2020 CLINICAL DATA:  Placement of Impella device. EXAM: CHEST  1 VIEW; DG C-ARM 1-60 MIN-NO REPORT Radiation exposure index: 19.83 mGy. COMPARISON:  December 20, 2020. FINDINGS: One intraoperative fluoroscopic image of the left  chest was obtained. This demonstrates an Impella device projected over the cardiac silhouette. IMPRESSION: Fluoroscopic guidance provided during Impella device placement. Electronically Signed   By: Lupita Raider M.D.   On: 12/21/2020 12:39   DG Chest Port 1 View  Result Date: 12/22/2020 CLINICAL DATA:  Atelectasis, chest pain. EXAM: PORTABLE CHEST 1 VIEW COMPARISON:  12/21/2020 and 12/20/2020. FINDINGS: Left IJ central line tip is at the SVC RA junction. Right IJ Swan-Ganz catheter tip is in the expected location of the proximal right pulmonary artery. Left ventricular Impella device is in place. The pigtail tip is not visualized. Heart is enlarged. Mild pulmonary vascular congestion versus is interstitial prominence and indistinctness. No definite pleural fluid. No pneumothorax. IMPRESSION: 1. Left ventricular Impella device in place. Pigtail tip is not visualized. 2. Pulmonary vascular congestion versus mild pulmonary edema. Electronically Signed   By: Leanna Battles M.D.   On: 12/22/2020 08:47   DG C-Arm 1-60 Min-No Report  Result Date: 12/21/2020 CLINICAL DATA:  Placement of Impella device. EXAM: CHEST  1 VIEW; DG C-ARM 1-60 MIN-NO REPORT Radiation exposure index: 19.83 mGy. COMPARISON:  December 20, 2020. FINDINGS: One intraoperative fluoroscopic image of the left chest was obtained. This demonstrates an Impella device projected over the cardiac silhouette. IMPRESSION: Fluoroscopic guidance provided during Impella device placement. Electronically Signed   By: Lupita Raider M.D.   On: 12/21/2020 12:39   ECHO INTRAOPERATIVE TEE  Result Date: 12/22/2020  *INTRAOPERATIVE TRANSESOPHAGEAL REPORT *  Patient Name:   Dylan Chaney Date of Exam: 12/21/2020 Medical Rec #:  [id number]      Height:       73.0 in Accession #:    [id number]     Weight:       187.6 lb Date of Birth:  Sep 18, 1970       BSA:          2.09 m Patient Age:    50 years       BP:           95/45 mmHg Patient Gender: M               HR:  105 bpm. Exam Location:  Anesthesiology Transesophogeal exam was perform intraoperatively during surgical procedure. Patient was closely monitored under general anesthesia during the entirety of examination. Indications:     I42.80 Non-ischemic cardiomyopathy Performing Phys: [id number] STEVEN C HENDRICKSON Complications: No known complications during this procedure. POST-OP IMPRESSIONS _ Aortic Valve: impella ventricular assist device is visualized crossing from the aorta, through aortic valve, and into the LV. The inflow port at the tip of the device is measured to be located 4.5cm from the aoric valve annulus. PRE-OP FINDINGS  Left Ventricle: The left ventricle EF is estimated 10%. The cavity size was severely dilated. Findings are consistent with dilated cardiomyopathy. Left ventricular diffuse severe hypokinesis. No intracardiac thrombi or masses were visualized. There is no left ventricular hypertrophy. Right Ventricle: The right ventricle has severely reduced systolic function. The cavity was dialated. There is no increase in right ventricular wall thickness. Left Atrium: Left atrial size was dilated. No left atrial/left atrial appendage thrombus was detected. Right Atrium: Right atrial size was dilated. Interatrial Septum: No atrial level shunt detected by color flow Doppler. Pericardium: There is no evidence of pericardial effusion. Mitral Valve: The mitral valve is dilated. Mitral valve regurgitation is mild by color flow Doppler. There is No evidence of mitral stenosis. Tricuspid Valve: The tricuspid valve was normal in structure. Tricuspid valve regurgitation is moderate by color flow Doppler. Aortic Valve: The aortic valve is tricuspid Aortic valve regurgitation was not visualized by color flow Doppler. There is no stenosis of the aortic valve. Pulmonic Valve: The pulmonic valve was normal in structure. Pulmonic valve regurgitation is not visualized by color flow Doppler. Aorta: The aortic root,  ascending aorta and aortic arch are normal in size and structure.  Achille Rich MD Electronically signed by Achille Rich MD Signature Date/Time: 12/22/2020/10:53:26 AM    Final      Medications:     Scheduled Medications:  aspirin EC  81 mg Oral Daily   Chlorhexidine Gluconate Cloth  6 each Topical Daily   digoxin  0.125 mg Oral Daily   docusate sodium  100 mg Oral BID   feeding supplement  237 mL Oral BID BM   mouth rinse  15 mL Mouth Rinse BID   pantoprazole  40 mg Oral Daily   polyethylene glycol  17 g Oral Daily   sodium chloride flush  10-40 mL Intracatheter Q12H   tamsulosin  0.4 mg Oral QPC supper    Infusions:  sodium chloride 20 mL/hr at 12/22/20 2100   heparin 50 units/mL (Impella PURGE) in dextrose 5 % 1000 mL bag 10.9 mL/hr at 12/21/20 1100   heparin 600 Units/hr (12/23/20 0506)   milrinone 0.5 mcg/kg/min (12/23/20 0506)   norepinephrine (LEVOPHED) Adult infusion 18 mcg/min (12/22/20 2326)    PRN Medications: sodium chloride, bisacodyl, fentaNYL (SUBLIMAZE) injection, lip balm, ondansetron **OR** ondansetron (ZOFRAN) IV, oxyCODONE, sodium chloride flush   Assessment/Plan:   1. 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 Will need cath +/- cMRI when more stable - Impella 5.5 placed 10/11 - Speed increased to P9 - Remains on milrinone 0.5 mcg + norepi  21 mcg. CO-OX 72%  - CVP 11. Continue IV lasix.  - Continue digoxin 0.125  - Watch RV. LDH trending up 226>290.  -  Given social situation advanced therapies would be challenging but if he does not recover with medical therapy, he has extensive support from [facility]. We will work to get him Medicaid to keep transplant option on the table for him. Not VAD candidate with severe RV dysfunction - HCV  quant is negative  - Blood type B+ - Dr Gala Romney spoke to Dr. Edwena Blow at [facility] [place] They would consider him for transplant evaluation (will need Medicaid first).    2. Acute PE  - CTA + bilateral segmental/subsegmental PE likely due to low output - Continue Heparin drip  - PLTs 79>74k  - HIT panel negative.  - SRA pending.    3. Acute blood loss anemia - bleeding from Impella site on 10/11 - heparin off hgb 12.9 -> 11.9>11.1  - Continue  low-dose heparin today   4. Shock liver - improving with hemodynamic support   5. AKI on CKD 3B - baseline SCr 1.8-2.0 - due to ATN from shock  - Creatinine normalized.   6. Thrombocytopenia - PLTs dropping. 84 -> 79 ==>74 today  -  HIT panel negative.  - SRA pending.   - can switch to bival as needed  7. HCV - viral RNA Quant negative   8. 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 Hyponatremia - 10/12 Tolvaptan  - Sdoium up to 132.   10. [facility] needs - Dr Gala Romney spoke with his caseworker April Anders [phone]). He was incarcerated almost a decade ago but not since. He has been homeless for years but recently moved into a hotel in June through the [facility]. 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 No substance use. Always first to volunteer to help other [employer] members move and do other tasks. Has a sister out of state but doesn't her to be contacted until he is improving. [employer] members are very attached to him and said they would drive him back and forth to [facility] for transplant eval,if needed. Other [employer] contact: Brother John: [phone]   Continue PT. OOB. Ambulate today.   Advanced Heart Failure Team Pager [phone] (M-F; 7a - 5p)  Please contact [facility] for night-coverage after hours (4p -7a ) and weekends on [email or web address]   Amy Clegg NP-C  7:07  AM  Agree with above.   Remains on Impella at P-9  Flows ~ 5.5L waveforms ok. Co-ox ~ 55%  CVP 7 (checked personally).   On NE 21 milrinone 0.5  Remains tachycardic. Back on heparin. Impella site stable.   General:  Sitting up in chair Weak appearing. No resp difficulty HEENT: normal Neck: supple. JVP 7  Carotids 2+ bilat; no bruits. No lymphadenopathy or thryomegaly appreciated. YNW:GNFAOZ site small hematoma. Regular tachy No rubs, gallops or murmurs.  Lungs: clear Abdomen: soft, nontender, nondistended. No hepatosplenomegaly. No bruits or masses. Good bowel sounds. Extremities: no cyanosis, clubbing, rash, 1+ edema cool  Neuro: alert & orientedx3, cranial nerves grossly intact. moves all 4 extremities w/o difficulty. Affect pleasant  Getting good flow from Impella but co-ox still marginal. Suspect RV still struggling a bit. Be careful not to overdiurese. Wean NE as tolerated.   Impella positioned checked personally on echo and was short. So impella advanced about 1.0 cm under echo guidance and re-secured.   Need to ambulate. Minimize  pain meds.    CRITICAL CARE Performed by: Arvilla Meres  Total critical care time: 45 minutes  Critical care time was exclusive of separately billable procedures and treating other patients.  Critical care was necessary to treat or prevent imminent or life-threatening deterioration.  Critical care was time spent personally by me (independent of midlevel providers or residents) on the following activities: development of treatment plan with patient and/or surrogate as well as nursing, discussions with consultants, evaluation of patient's response to treatment, examination of patient, obtaining history from patient or surrogate, ordering and performing treatments and interventions, ordering and review of laboratory studies, ordering and review of radiographic studies, pulse oximetry and re-evaluation of patient's condition.  Arvilla Meres, MD   10:40 AM

## 2020-12-23 NOTE — Progress Notes (Signed)
      [address]       [place] [address]             [phone]       Up in chair, eating dinner  BP 104/61   Pulse 83   Temp 98.7 F (37.1 C)   Resp (!) 25   Ht 6\' 1"  (1.854 m)   Wt 89.4 kg   SpO2 100%   BMI 26.00 kg/m  CVP = 14 Impella p9 with 5.1 L/min Levophed 19, milrinone 0.5 Right shoulder hematoma stable  C. Viviann Spare, MD [facility] [phone]

## 2020-12-23 NOTE — Evaluation (Signed)
Occupational Therapy Evaluation Patient Details Name: Dylan Chaney MRN: [id number] DOB: 1970-03-15 Today's Date: 12/23/2020   History of Present Illness Pt is a 50 y.o. M who presents with CP and SOB. CT showed bilateral PE. Echo showed EF < 20%. Decision made for mechanical support as possible bridge to transplant. 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 Significant PMH: remote drug and alcohol abuse.   Clinical Impression   Patient admitted for the diagnosis and procedure above.  PTA he was working full time, and was living in a local hotel.  He describes increasing SOB and difficulty standing for long periods of time, but was still independent with ADL/IADL and functional mobility.  Deficits impacting independence are listed below.  Currently he is moving with up to Min A and +2 for safety given line and leads, and up to Min A for lower body ADL.  OT will continue to follow in the acute setting to maximize his functional status, but no post acute OT is anticipated depending on progress.       Recommendations for follow up therapy are one component of a multi-disciplinary discharge planning process, led by the attending physician.  Recommendations may be updated based on patient status, additional functional criteria and insurance authorization.   Follow Up Recommendations  No OT follow up    Equipment Recommendations  None recommended by OT    Recommendations for Other Services       Precautions / Restrictions Precautions Precautions: Fall;Other (comment) Precaution Comments: Impella, Theone Murdoch Restrictions Other Position/Activity Restrictions: R shoulder hemotoma      Mobility Bed Mobility Overal bed mobility: Needs Assistance Bed Mobility: Supine to Sit     Supine to sit: Min guard          Transfers Overall transfer level: Needs assistance Equipment used: None Transfers: Sit to/from [employer] Sit to Stand: Min  guard;+2 safety/equipment Stand pivot transfers: Min assist;+2 safety/equipment       General transfer comment: one small LOB backwrds, but able to recover without assist    Balance Overall balance assessment: Mild deficits observed, not formally tested                                         ADL either performed or assessed with clinical judgement   ADL Overall ADL's : Needs assistance/impaired Eating/Feeding: Independent;Sitting   Grooming: Wash/dry hands;Wash/dry face;Set up;Sitting   Upper Body Bathing: Set up;Sitting   Lower Body Bathing: Min guard;Sit to/from stand   Upper Body Dressing : Minimal assistance;Sitting Upper Body Dressing Details (indicate cue type and reason): lines and leads Lower Body Dressing: Min guard;Sit to/from stand       Toileting- Architect and Hygiene: Min guard;Sit to/from stand       Functional mobility during ADLs: Min guard       Vision Patient Visual Report: No change from baseline                  Pertinent Vitals/Pain Pain Assessment: Faces Faces Pain Scale: Hurts little more Pain Location: R shoulder Pain Descriptors / Indicators: Tender Pain Intervention(s): Monitored during session     Hand Dominance Right   Extremity/Trunk Assessment Upper Extremity Assessment Upper Extremity Assessment: RUE deficits/detail RUE Deficits / Details: hematoma limiting ROM and MMT RUE Sensation: WNL RUE Coordination: WNL   Lower Extremity Assessment Lower Extremity Assessment: Defer  to PT evaluation   Cervical / Trunk Assessment Cervical / Trunk Assessment: Normal   Communication Communication Communication: No difficulties   Cognition Arousal/Alertness: Awake/alert Behavior During Therapy: WFL for tasks assessed/performed                                                        Home Living Family/patient expects to be discharged to:: Shelter/Homeless Living  Arrangements: Alone Available Help at Discharge: Other (Comment) Type of Home: Homeless (Patient needing to find shelter/home via [facility])       Home Layout: One level     Bathroom Shower/Tub: Chief Strategy Officer: Standard     Home Equipment: None          Prior Functioning/Environment Level of Independence: Independent        Comments: Working as Public affairs consultant at [employer] Reports last 8-12 weeks has been difficult to walk or stand for long periods        OT Problem List: Decreased range of motion;Decreased activity tolerance;Impaired balance (sitting and/or standing);Pain      OT Treatment/Interventions: Self-care/ADL training;Therapeutic exercise;Therapeutic activities;Balance training    OT Goals(Current goals can be found in the care plan section) Acute Rehab OT Goals Patient Stated Goal: Just get back on my feet OT Goal Formulation: With patient Time For Goal Achievement: 01/06/21 Potential to Achieve Goals: Good ADL Goals Pt Will Perform Grooming: with set-up;standing Pt Will Perform Lower Body Bathing: with set-up;sit to/from stand Pt Will Perform Lower Body Dressing: with set-up;sit to/from stand Pt Will Transfer to Toilet: with modified independence;ambulating;regular height toilet Pt Will Perform Toileting - Clothing Manipulation and hygiene: with modified independence;sit to/from stand Pt/caregiver will Perform Home Exercise Program: Increased ROM;Right Upper extremity;Left upper extremity;With Supervision;With written HEP provided  OT Frequency: Min 2X/week   Barriers to D/C: Other (comment)  searching for a home       Co-evaluation              AM-PAC OT "6 Clicks" Daily Activity     Outcome Measure Help from another person eating meals?: None Help from another person taking care of personal grooming?: None Help from another person toileting, which includes using toliet, bedpan, or urinal?: A Little Help from another person  bathing (including washing, rinsing, drying)?: A Little Help from another person to put on and taking off regular upper body clothing?: A Little Help from another person to put on and taking off regular lower body clothing?: A Little 6 Click Score: 20   End of Session Nurse Communication: Mobility status  Activity Tolerance: Patient tolerated treatment well Patient left: in chair;with call bell/phone within reach;with nursing/sitter in room  OT Visit Diagnosis: Unsteadiness on feet (R26.81);Pain Pain - Right/Left: Right Pain - part of body: Arm                Time: 0811-0825 OT Time Calculation (min): 14 min Charges:  OT General Charges $OT Visit: 1 Visit OT Evaluation $OT Eval Moderate Complexity: 1 Mod  12/23/2020  RP, OTR/L  Acute Rehabilitation Services  Office:  [phone]   Suzanna Obey 12/23/2020, 8:40 AM

## 2020-12-24 ENCOUNTER — Inpatient Hospital Stay (HOSPITAL_COMMUNITY): Payer: Medicaid Other

## 2020-12-24 ENCOUNTER — Inpatient Hospital Stay (HOSPITAL_COMMUNITY): Payer: Medicaid Other | Admitting: Certified Registered Nurse Anesthetist

## 2020-12-24 ENCOUNTER — Encounter (HOSPITAL_COMMUNITY)
Admission: EM | Disposition: A | Discharge status: Home Health | Payer: Self-pay | Source: Home / Self Care | Attending: Internal Medicine

## 2020-12-24 DIAGNOSIS — I97638 Postprocedural hematoma of a circulatory system organ or structure following other circulatory system procedure: Secondary | ICD-10-CM

## 2020-12-24 HISTORY — PX: HEMATOMA EVACUATION: SHX5118

## 2020-12-24 LAB — HEPATITIS PANEL, ACUTE
Hep A IgM: NONREACTIVE
Hep B C IgM: NONREACTIVE
Hepatitis B Surface Ag: NONREACTIVE

## 2020-12-24 LAB — COOXEMETRY PANEL
Carboxyhemoglobin: 1.9 % — ABNORMAL HIGH (ref 0.5–1.5)
Methemoglobin: 1 % (ref 0.0–1.5)
O2 Saturation: 62.4 %
Total hemoglobin: 10.7 g/dL — ABNORMAL LOW (ref 12.0–16.0)

## 2020-12-24 LAB — BASIC METABOLIC PANEL
Anion gap: 9 (ref 5–15)
BUN: 16 mg/dL (ref 6–20)
CO2: 28 mmol/L (ref 22–32)
Calcium: 8.4 mg/dL — ABNORMAL LOW (ref 8.9–10.3)
Chloride: 95 mmol/L — ABNORMAL LOW (ref 98–111)
Creatinine, Ser: 1.05 mg/dL (ref 0.61–1.24)
GFR, Estimated: >60 mL/min (ref >60)
Glucose, Bld: 164 mg/dL — ABNORMAL HIGH (ref 70–99)
Potassium: 4 mmol/L (ref 3.5–5.1)
Sodium: 132 mmol/L — ABNORMAL LOW (ref 135–145)

## 2020-12-24 LAB — CBC
HCT: 31.4 % — ABNORMAL LOW (ref 39.0–52.0)
HCT: 32.4 % — ABNORMAL LOW (ref 39.0–52.0)
Hemoglobin: 11 g/dL — ABNORMAL LOW (ref 13.0–17.0)
Hemoglobin: 11.2 g/dL — ABNORMAL LOW (ref 13.0–17.0)
MCH: 28.6 pg (ref 26.0–34.0)
MCH: 28.4 pg (ref 26.0–34.0)
MCHC: 35 g/dL (ref 30.0–36.0)
MCHC: 34.6 g/dL (ref 30.0–36.0)
MCV: 81.8 fL (ref 80.0–100.0)
MCV: 82.2 fL (ref 80.0–100.0)
Platelets: 74 10*3/uL — ABNORMAL LOW (ref 150–400)
Platelets: 94 10*3/uL — ABNORMAL LOW (ref 150–400)
RBC: 3.84 MIL/uL — ABNORMAL LOW (ref 4.22–5.81)
RBC: 3.94 MIL/uL — ABNORMAL LOW (ref 4.22–5.81)
RDW: 13.8 % (ref 11.5–15.5)
RDW: 13.7 % (ref 11.5–15.5)
WBC: 12.9 10*3/uL — ABNORMAL HIGH (ref 4.0–10.5)
WBC: 15.5 10*3/uL — ABNORMAL HIGH (ref 4.0–10.5)
nRBC: 0 % (ref 0.0–0.2)
nRBC: 0 % (ref 0.0–0.2)

## 2020-12-24 LAB — POCT I-STAT, CHEM 8
BUN: 18 mg/dL (ref 6–20)
Calcium, Ion: 1.1 mmol/L — ABNORMAL LOW (ref 1.15–1.40)
Chloride: 89 mmol/L — ABNORMAL LOW (ref 98–111)
Creatinine, Ser: 1 mg/dL (ref 0.61–1.24)
Glucose, Bld: 181 mg/dL — ABNORMAL HIGH (ref 70–99)
HCT: 40 % (ref 39.0–52.0)
Hemoglobin: 13.6 g/dL (ref 13.0–17.0)
Potassium: 4.4 mmol/L (ref 3.5–5.1)
Sodium: 132 mmol/L — ABNORMAL LOW (ref 135–145)
TCO2: 30 mmol/L (ref 22–32)

## 2020-12-24 LAB — GLUCOSE, CAPILLARY
Glucose-Capillary: 218 mg/dL — ABNORMAL HIGH (ref 70–99)
Glucose-Capillary: 220 mg/dL — ABNORMAL HIGH (ref 70–99)
Glucose-Capillary: 183 mg/dL — ABNORMAL HIGH (ref 70–99)
Glucose-Capillary: 177 mg/dL — ABNORMAL HIGH (ref 70–99)

## 2020-12-24 LAB — PREPARE RBC (CROSSMATCH)

## 2020-12-24 LAB — LACTATE DEHYDROGENASE: LDH: 298 U/L — ABNORMAL HIGH (ref 98–192)

## 2020-12-24 LAB — MAGNESIUM: Magnesium: 2 mg/dL (ref 1.7–2.4)

## 2020-12-24 LAB — HEPARIN LEVEL (UNFRACTIONATED)
Heparin Unfractionated: <0.1 IU/mL — ABNORMAL LOW (ref 0.30–0.70)
Heparin Unfractionated: 0.19 IU/mL — ABNORMAL LOW (ref 0.30–0.70)

## 2020-12-24 SURGERY — EVACUATION HEMATOMA
Anesthesia: General

## 2020-12-24 MED ORDER — VANCOMYCIN HCL 1000 MG IV SOLR
INTRAVENOUS | Status: DC | PRN
  Administered 2020-12-24: 1000 mg via INTRAVENOUS

## 2020-12-24 MED ORDER — PHENYLEPHRINE 40 MCG/ML (10ML) SYRINGE FOR IV PUSH (FOR BLOOD PRESSURE SUPPORT)
PREFILLED_SYRINGE | INTRAVENOUS | Status: AC
  Filled 2020-12-24: qty 10

## 2020-12-24 MED ORDER — SUGAMMADEX SODIUM 200 MG/2ML IV SOLN
INTRAVENOUS | Status: DC | PRN
  Administered 2020-12-24: 200 mg via INTRAVENOUS

## 2020-12-24 MED ORDER — AMIODARONE HCL IN DEXTROSE 360-4.14 MG/200ML-% IV SOLN
30.0000 mg/h | INTRAVENOUS | Status: DC
  Administered 2020-12-24 – 2020-12-26 (×5): 30 mg/h via INTRAVENOUS
  Administered 2020-12-27 – 2020-12-31 (×16): 60 mg/h via INTRAVENOUS
  Administered 2020-12-31 – 2021-01-05 (×11): 30 mg/h via INTRAVENOUS
  Filled 2020-12-24 (×9): qty 200
  Filled 2020-12-24: qty 600
  Filled 2020-12-24 (×12): qty 200
  Filled 2020-12-24: qty 600
  Filled 2020-12-24: qty 200
  Filled 2020-12-24: qty 400
  Filled 2020-12-24 (×6): qty 200

## 2020-12-24 MED ORDER — SUCCINYLCHOLINE CHLORIDE 200 MG/10ML IV SOSY
PREFILLED_SYRINGE | INTRAVENOUS | Status: AC
  Filled 2020-12-24: qty 10

## 2020-12-24 MED ORDER — TRAMADOL HCL 50 MG PO TABS
50.0000 mg | ORAL_TABLET | Freq: Four times a day (QID) | ORAL | Status: DC | PRN
Start: 2020-12-24 — End: 2021-01-06
  Administered 2020-12-25 – 2021-01-05 (×17): 50 mg via ORAL
  Filled 2020-12-24 (×17): qty 1

## 2020-12-24 MED ORDER — EPINEPHRINE 1 MG/10ML IJ SOSY
PREFILLED_SYRINGE | INTRAMUSCULAR | Status: AC
  Filled 2020-12-24: qty 10

## 2020-12-24 MED ORDER — FENTANYL CITRATE (PF) 250 MCG/5ML IJ SOLN
INTRAMUSCULAR | Status: DC | PRN
  Administered 2020-12-24 (×3): 50 ug via INTRAVENOUS

## 2020-12-24 MED ORDER — PROPOFOL 10 MG/ML IV BOLUS
INTRAVENOUS | Status: AC
  Filled 2020-12-24: qty 20

## 2020-12-24 MED ORDER — FENTANYL CITRATE (PF) 250 MCG/5ML IJ SOLN
INTRAMUSCULAR | Status: AC
  Filled 2020-12-24: qty 5

## 2020-12-24 MED ORDER — VANCOMYCIN HCL 1000 MG IV SOLR
INTRAVENOUS | Status: AC
  Filled 2020-12-24: qty 20

## 2020-12-24 MED ORDER — LIDOCAINE 2% (20 MG/ML) 5 ML SYRINGE
INTRAMUSCULAR | Status: DC | PRN
  Administered 2020-12-24: 70 mg via INTRAVENOUS

## 2020-12-24 MED ORDER — ETOMIDATE 2 MG/ML IV SOLN
INTRAVENOUS | Status: DC | PRN
  Administered 2020-12-24: 10 mg via INTRAVENOUS

## 2020-12-24 MED ORDER — AMIODARONE HCL IN DEXTROSE 360-4.14 MG/200ML-% IV SOLN
30.0000 mg/h | INTRAVENOUS | Status: DC
  Administered 2020-12-24: 60 mg/h via INTRAVENOUS

## 2020-12-24 MED ORDER — ROCURONIUM BROMIDE 10 MG/ML (PF) SYRINGE
PREFILLED_SYRINGE | INTRAVENOUS | Status: DC | PRN
  Administered 2020-12-24: 60 mg via INTRAVENOUS

## 2020-12-24 MED ORDER — VANCOMYCIN HCL IN DEXTROSE 1-5 GM/200ML-% IV SOLN
INTRAVENOUS | Status: AC
  Filled 2020-12-24: qty 200

## 2020-12-24 MED ORDER — ONDANSETRON HCL 4 MG/2ML IJ SOLN
INTRAMUSCULAR | Status: AC
  Filled 2020-12-24: qty 2

## 2020-12-24 MED ORDER — ONDANSETRON HCL 4 MG/2ML IJ SOLN
INTRAMUSCULAR | Status: DC | PRN
  Administered 2020-12-24: 4 mg via INTRAVENOUS

## 2020-12-24 MED ORDER — FUROSEMIDE 10 MG/ML IJ SOLN
40.0000 mg | Freq: Once | INTRAMUSCULAR | Status: AC
  Administered 2020-12-24: 40 mg via INTRAVENOUS
  Filled 2020-12-24: qty 4

## 2020-12-24 MED ORDER — HEMOSTATIC AGENTS (NO CHARGE) OPTIME
TOPICAL | Status: DC | PRN
  Administered 2020-12-24: 1 via TOPICAL
  Administered 2020-12-24: 2 via TOPICAL

## 2020-12-24 MED ORDER — SODIUM CHLORIDE 0.9 % IV SOLN: INTRAVENOUS | Status: DC | PRN

## 2020-12-24 MED ORDER — SUCCINYLCHOLINE CHLORIDE 200 MG/10ML IV SOSY
PREFILLED_SYRINGE | INTRAVENOUS | Status: DC | PRN
  Administered 2020-12-24: 120 mg via INTRAVENOUS

## 2020-12-24 MED ORDER — LIDOCAINE 2% (20 MG/ML) 5 ML SYRINGE
INTRAMUSCULAR | Status: AC
  Filled 2020-12-24: qty 5

## 2020-12-24 MED ORDER — ETOMIDATE 2 MG/ML IV SOLN
INTRAVENOUS | Status: AC
  Filled 2020-12-24: qty 10

## 2020-12-24 MED ORDER — MIDAZOLAM HCL 2 MG/2ML IJ SOLN
INTRAMUSCULAR | Status: DC | PRN
  Administered 2020-12-24: 2 mg via INTRAVENOUS

## 2020-12-24 MED ORDER — MIDAZOLAM HCL 2 MG/2ML IJ SOLN
INTRAMUSCULAR | Status: AC
  Filled 2020-12-24: qty 2

## 2020-12-24 MED ORDER — INSULIN GLARGINE-YFGN 100 UNIT/ML ~~LOC~~ SOLN
5.0000 [IU] | Freq: Every day | SUBCUTANEOUS | Status: DC
  Administered 2020-12-24: 5 [IU] via SUBCUTANEOUS
  Filled 2020-12-24 (×2): qty 0.05

## 2020-12-24 MED ORDER — 0.9 % SODIUM CHLORIDE (POUR BTL) OPTIME
TOPICAL | Status: DC | PRN
  Administered 2020-12-24: 2000 mL

## 2020-12-24 SURGICAL SUPPLY — 46 items
CANISTER SUCT 3000ML PPV (MISCELLANEOUS) ×2 IMPLANT
CLIP TI MEDIUM 24 (CLIP) ×2 IMPLANT
CLIP TI WIDE RED SMALL 24 (CLIP) ×2 IMPLANT
CLIP VESOCCLUDE MED 6/CT (CLIP) ×2 IMPLANT
CLIP VESOCCLUDE SM WIDE 6/CT (CLIP) ×2 IMPLANT
CNTNR URN SCR LID CUP LEK RST (MISCELLANEOUS) ×2 IMPLANT
CONT SPEC 4OZ STRL OR WHT (MISCELLANEOUS) ×4
COVER SURGICAL LIGHT HANDLE (MISCELLANEOUS) ×2 IMPLANT
DRAPE CV SPLIT W-CLR ANES SCRN (DRAPES) ×2 IMPLANT
DRAPE HALF SHEET 40X57 (DRAPES) ×2 IMPLANT
DRAPE INCISE IOBAN 66X45 STRL (DRAPES) ×4 IMPLANT
DRAPE PERI GROIN 82X75IN TIB (DRAPES) ×2 IMPLANT
DRSG TEGADERM 4X4.5 CHG (GAUZE/BANDAGES/DRESSINGS) ×2 IMPLANT
ELECT CAUTERY BLADE 6.4 (BLADE) ×2 IMPLANT
ELECT REM PT RETURN 9FT ADLT (ELECTROSURGICAL) ×2
ELECTRODE REM PT RTRN 9FT ADLT (ELECTROSURGICAL) ×1 IMPLANT
FELT TEFLON 1X6 (MISCELLANEOUS) ×2 IMPLANT
GAUZE SPONGE 4X4 12PLY STRL (GAUZE/BANDAGES/DRESSINGS) ×2 IMPLANT
GAUZE SPONGE 4X4 12PLY STRL LF (GAUZE/BANDAGES/DRESSINGS) ×2 IMPLANT
GLOVE SURG SIGNA 7.5 PF LTX (GLOVE) ×2 IMPLANT
GOWN STRL REUS W/ TWL XL LVL3 (GOWN DISPOSABLE) ×1 IMPLANT
GOWN STRL REUS W/TWL XL LVL3 (GOWN DISPOSABLE) ×2
HEMOSTAT SURGICEL 2X14 (HEMOSTASIS) ×4 IMPLANT
KIT BASIN OR (CUSTOM PROCEDURE TRAY) ×2 IMPLANT
KIT TURNOVER KIT B (KITS) ×2 IMPLANT
NS IRRIG 1000ML POUR BTL (IV SOLUTION) ×2 IMPLANT
PACK GENERAL/GYN (CUSTOM PROCEDURE TRAY) ×2 IMPLANT
PAD ARMBOARD 7.5X6 YLW CONV (MISCELLANEOUS) ×4 IMPLANT
PENCIL BUTTON HOLSTER BLD 10FT (ELECTRODE) ×2 IMPLANT
POWDER SURGICEL 3.0 GRAM (HEMOSTASIS) ×2 IMPLANT
SPONGE T-LAP 18X18 ~~LOC~~+RFID (SPONGE) ×4 IMPLANT
SPONGE T-LAP 4X18 ~~LOC~~+RFID (SPONGE) ×2 IMPLANT
STAPLER VISISTAT 35W (STAPLE) ×2 IMPLANT
SUT PROLENE 5 0 C 1 36 (SUTURE) ×2 IMPLANT
SUT SILK 2 0 (SUTURE) ×2
SUT SILK 2-0 18XBRD TIE 12 (SUTURE) ×1 IMPLANT
SUT VIC AB 0 CT1 27 (SUTURE) ×2
SUT VIC AB 0 CT1 27XBRD ANBCTR (SUTURE) ×1 IMPLANT
SUT VIC AB 2-0 CT1 27 (SUTURE) ×4
SUT VIC AB 2-0 CT1 TAPERPNT 27 (SUTURE) ×2 IMPLANT
SUT VIC AB 3-0 X1 27 (SUTURE) ×2 IMPLANT
SYR BULB IRRIG 60ML STRL (SYRINGE) ×2 IMPLANT
TAPE CLOTH SURG 4X10 WHT LF (GAUZE/BANDAGES/DRESSINGS) ×2 IMPLANT
TOWEL GREEN STERILE (TOWEL DISPOSABLE) ×2 IMPLANT
TOWEL GREEN STERILE FF (TOWEL DISPOSABLE) ×2 IMPLANT
WATER STERILE IRR 1000ML POUR (IV SOLUTION) ×2 IMPLANT

## 2020-12-24 NOTE — Anesthesia Preprocedure Evaluation (Addendum)
Anesthesia Evaluation  Patient identified by MRN, date of birth, ID band Patient awake    Reviewed: Allergy & Precautions, NPO status Preop documentation limited or incomplete due to emergent nature of procedure.  Airway Mallampati: II  TM Distance: >3 FB     Dental   Pulmonary former smoker,    breath sounds clear to auscultation       Cardiovascular +CHF   Rhythm:Regular Rate:Normal     Neuro/Psych    GI/Hepatic negative GI ROS, Neg liver ROS,   Endo/Other    Renal/GU Renal disease     Musculoskeletal   Abdominal   Peds  Hematology   Anesthesia Other Findings   Reproductive/Obstetrics                            Anesthesia Physical Anesthesia Plan  ASA: 3  Anesthesia Plan: General   Post-op Pain Management:    Induction: Intravenous  PONV Risk Score and Plan: 2  Airway Management Planned: Oral ETT  Additional Equipment:   Intra-op Plan:   Post-operative Plan: Possible Post-op intubation/ventilation  Informed Consent: I have reviewed the patients History and Physical, chart, labs and discussed the procedure including the risks, benefits and alternatives for the proposed anesthesia with the patient or authorized representative who has indicated his/her understanding and acceptance.     Dental advisory given  Plan Discussed with: CRNA and Anesthesiologist  Anesthesia Plan Comments:         Anesthesia Quick Evaluation

## 2020-12-24 NOTE — Transfer of Care (Signed)
Immediate Anesthesia Transfer of Care Note  Patient: Dylan Chaney  Procedure(s) Performed: EVACUATION HEMATOMA  Patient Location: ICU  Anesthesia Type:General  Level of Consciousness: drowsy, patient cooperative and responds to stimulation  Airway & Oxygen Therapy: Patient Spontanous Breathing and Patient connected to face mask oxygen  Post-op Assessment: Report given to RN and Post -op Vital signs reviewed and stable  Post vital signs: Reviewed and stable  Last Vitals:  Vitals Value Taken Time  BP    Temp    Pulse    Resp    SpO2      Last Pain:  Vitals:   12/24/20 1644  TempSrc: Oral  PainSc:       Patients Stated Pain Goal: 3 (12/23/20 0500)  Complications: No notable events documented.

## 2020-12-24 NOTE — Brief Op Note (Signed)
12/24/2020  6:44 PM  PATIENT:  Jeannetta Nap  50 y.o. male  PRE-OPERATIVE DIAGNOSIS:  Hematoma post Impella placement  POST-OPERATIVE DIAGNOSIS:  Hematoma post Impella placement  PROCEDURE:  Procedure(s): EVACUATION HEMATOMA (N/A)  SURGEON:  Surgeon(s) and Role:    * Loreli Slot, MD - Primary  PHYSICIAN ASSISTANT:   ASSISTANTS: none   ANESTHESIA:   general  EBL:  100 mL   BLOOD ADMINISTERED: 1 unit PLTS  DRAINS: none   LOCAL MEDICATIONS USED:  NONE  SPECIMEN:  No Specimen  DISPOSITION OF SPECIMEN:  N/A  COUNTS:  YES  TOURNIQUET:  * No tourniquets in log *  DICTATION: .Other Dictation: Dictation Number -  PLAN OF CARE: Admit to inpatient   PATIENT DISPOSITION:  ICU - extubated and stable.   Delay start of Pharmacological VTE agent (>24hrs) due to surgical blood loss or risk of bleeding: yes

## 2020-12-24 NOTE — Progress Notes (Signed)
Physical Therapy Treatment Patient Details Name: Dylan Chaney MRN: [id number] DOB: November 26, 1970 Today's Date: 12/24/2020   History of Present Illness Pt is a 50 y.o. M who presents with CP and SOB. CT showed bilateral PE. Echo showed EF < 20%. Decision made for mechanical support as possible bridge to transplant. 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 Significant PMH: remote drug and alcohol abuse.    PT Comments    The pt was able to make great progress with OOB mobility and ambulation this session. He was able to complete ~400 ft hallway ambulation with minG for safety and +2 for line management, but had no overt LOB or need for standing rest break at this time. The pt remains highly motivated to improve mobility and return to prior level of independence. Will continue to benefit from skilled PT acutely and following d/c to maximize independence, endurance, and safety with mobility at home.   VSS on RA. HR 120-127bpm    Recommendations for follow up therapy are one component of a multi-disciplinary discharge planning process, led by the attending physician.  Recommendations may be updated based on patient status, additional functional criteria and insurance authorization.  Follow Up Recommendations  Home health PT;Supervision for mobility/OOB     Equipment Recommendations  3in1 (PT);Other (comment) (rollator)    Recommendations for Other Services       Precautions / Restrictions Precautions Precautions: Fall;Other (comment) Precaution Comments: Impella, CVC L IJ Restrictions Weight Bearing Restrictions: Yes RUE Partial Weight Bearing Percentage or Pounds: no AROM above head     Mobility  Bed Mobility Overal bed mobility: Needs Assistance             General bed mobility comments: pt OOB in recliner at start and end of session    Transfers Overall transfer level: Needs assistance Equipment used: 4-wheeled walker Transfers: Sit  to/from Stand Sit to Stand: Min guard;+2 safety/equipment         General transfer comment: minG to power up, no assist needed to complete or steady, +2 for line management  Ambulation/Gait Ambulation/Gait assistance: Min guard;+2 safety/equipment Gait Distance (Feet): 380 Feet Assistive device: 4-wheeled walker Gait Pattern/deviations: Step-through pattern;Decreased stride length Gait velocity: 0.3 m/s Gait velocity interpretation: <1.31 ft/sec, indicative of household ambulator General Gait Details: slight trunk flexion, no overt LOB. intermittent cues for self-monitoring of rest. HR 120-126 bpm.         Cognition Arousal/Alertness: Awake/alert Behavior During Therapy: WFL for tasks assessed/performed Overall Cognitive Status: Within Functional Limits for tasks assessed                                        Exercises General Exercises - Lower Extremity Ankle Circles/Pumps: AROM;Both;10 reps;Seated Hip Flexion/Marching: AROM;Both;10 reps;Seated    General Comments General comments (skin integrity, edema, etc.): VSS on RA with gait. +2 to manage impella and lines      Pertinent Vitals/Pain Pain Assessment: Faces Faces Pain Scale: Hurts even more Pain Location: impella site, back Pain Descriptors / Indicators: Tender Pain Intervention(s): Limited activity within patient's tolerance;Monitored during session;Repositioned     PT Goals (current goals can now be found in the care plan section) Acute Rehab PT Goals Patient Stated Goal: Just get back on my feet PT Goal Formulation: With patient Time For Goal Achievement: 01/05/21 Potential to Achieve Goals: Good Progress towards PT goals: Progressing toward goals  Frequency    Min 3X/week      PT Plan Current plan remains appropriate       AM-PAC PT "6 Clicks" Mobility   Outcome Measure  Help needed turning from your back to your side while in a flat bed without using bedrails?: None Help  needed moving from lying on your back to sitting on the side of a flat bed without using bedrails?: A Little Help needed moving to and from a bed to a chair (including a wheelchair)?: A Little Help needed standing up from a chair using your arms (e.g., wheelchair or bedside chair)?: A Little Help needed to walk in hospital room?: A Little Help needed climbing 3-5 steps with a railing? : Total 6 Click Score: 17    End of Session   Activity Tolerance: Patient tolerated treatment well Patient left: in chair;with call bell/phone within reach Nurse Communication: Mobility status PT Visit Diagnosis: Unsteadiness on feet (R26.81);Difficulty in walking, not elsewhere classified (R26.2)     Time: 1030-1051 PT Time Calculation (min) (ACUTE ONLY): 21 min  Charges:  $Gait Training: 8-22 mins                     Vickki Muff, PT, DPT   Acute Rehabilitation Department Pager #: [phone]   Ronnie Derby 12/24/2020, 11:37 AM

## 2020-12-24 NOTE — Progress Notes (Signed)
CH visited pt. per [facility] consult for assistance w/AD education/completion.  When [facility] arrived pt. was sitting up in recliner with mother in chair opposite him.  Pt. confirms he would like to complete AD this admission if possible; pt.'s sister joined conversation as [facility] discussed AD contents and provided education.  Pt. and mother say sister (who will be HCPOA) has had medical decisionmaking experience in caring for several uncles in the recent past.  Pt. expressed high degree of confidence in his sister's judgement and insight in evaluating the best course of action.  Pt. and family are Christians; sister is a Education officer, environmental in [place], [place].  Pt. trusts that despite his current illness God is holding him safe in the "spiritual realm".  Family and pt. grateful for visit and will have RN page chaplains if they complete AD and need help w/notarization or if they have further needs.

## 2020-12-24 NOTE — H&P (View-Only) (Signed)
3 Days Post-Op Procedure(s) (LRB): PLACEMENT OF IMPELLA LEFT VENTRICULAR ASSIST DEVICE (N/A) Subjective: Soreness at incision  Objective: Vital signs in last 24 hours: Temp:  [98.3 F (36.8 C)-100 F (37.8 C)] 98.4 F (36.9 C) (10/14 0615) Pulse Rate:  [31-162] 64 (10/14 1300) Cardiac Rhythm: Sinus tachycardia (10/14 0800) Resp:  [13-25] 18 (10/14 1500) BP: (63-107)/(30-86) 72/30 (10/14 1400) SpO2:  [87 %-100 %] 98 % (10/14 1300) Weight:  [87.5 kg] 87.5 kg (10/14 0600)  Hemodynamic parameters for last 24 hours: CVP:  [7 mmHg-29 mmHg] 14 mmHg  Intake/Output from previous day: 10/13 0701 - 10/14 0700 In: 3529.9 [P.O.:2000; I.V.:1250.1] Out: 6525 [Urine:6525] Intake/Output this shift: Total I/O In: 971.3 [P.O.:480; I.V.:398.7; Other:92.6] Out: 1040 [Urine:1040]  General appearance: alert, cooperative, and no distress Neurologic: intact Wound: increased size of hematoma  Lab Results: Recent Labs    12/23/20 0408 12/23/20 0435 12/24/20 0100  WBC 11.0*  --  12.9*  HGB 11.1* 11.2* 11.0*  HCT 31.4* 33.0* 31.4*  PLT 74*  --  74*   BMET:  Recent Labs    12/23/20 0408 12/23/20 0435 12/24/20 0100  NA 132* 130* 132*  K 4.3 4.3 4.0  CL 95*  --  95*  CO2 28  --  28  GLUCOSE 223*  --  164*  BUN 12  --  16  CREATININE 1.09  --  1.05  CALCIUM 8.6*  --  8.4*    PT/INR: No results for input(s): LABPROT, INR in the last 72 hours. ABG    Component Value Date/Time   PHART 7.470 (H) 12/23/2020 0435   HCO3 28.7 (H) 12/23/2020 0435   TCO2 30 12/23/2020 0435   ACIDBASEDEF 2.0 12/22/2020 0415   O2SAT 62.4 12/24/2020 0403   CBG (last 3)  Recent Labs    12/24/20 0611 12/24/20 0617 12/24/20 1157  GLUCAP 218* 220* 183*    Assessment/Plan: S/P Procedure(s) (LRB): PLACEMENT OF IMPELLA LEFT VENTRICULAR ASSIST DEVICE (N/A) - Remains on Impella @p9 with flow 5.1 L -waveforms oK Diuresing well The hematoma at the insertion site continues to increase in size. Will take  back to OR for reexploration and evacuation of hematoma Discussed risks, benefits with patient. He is agreeable to proceed.   LOS: 8 days    Lorien Shingler C Ronda Kazmi 12/24/2020   

## 2020-12-24 NOTE — Progress Notes (Signed)
3 Days Post-Op Procedure(s) (LRB): PLACEMENT OF IMPELLA LEFT VENTRICULAR ASSIST DEVICE (N/A) Subjective: Soreness at incision  Objective: Vital signs in last 24 hours: Temp:  [98.3 F (36.8 C)-100 F (37.8 C)] 98.4 F (36.9 C) (10/14 0615) Pulse Rate:  [31-162] 64 (10/14 1300) Cardiac Rhythm: Sinus tachycardia (10/14 0800) Resp:  [13-25] 18 (10/14 1500) BP: (63-107)/(30-86) 72/30 (10/14 1400) SpO2:  [87 %-100 %] 98 % (10/14 1300) Weight:  [87.5 kg] 87.5 kg (10/14 0600)  Hemodynamic parameters for last 24 hours: CVP:  [7 mmHg-29 mmHg] 14 mmHg  Intake/Output from previous day: 10/13 0701 - 10/14 0700 In: 3529.9 [P.O.:2000; I.V.:1250.1] Out: 6525 [Urine:6525] Intake/Output this shift: Total I/O In: 971.3 [P.O.:480; I.V.:398.7; Other:92.6] Out: 1040 [Urine:1040]  General appearance: alert, cooperative, and no distress Neurologic: intact Wound: increased size of hematoma  Lab Results: Recent Labs    12/23/20 0408 12/23/20 0435 12/24/20 0100  WBC 11.0*  --  12.9*  HGB 11.1* 11.2* 11.0*  HCT 31.4* 33.0* 31.4*  PLT 74*  --  74*   BMET:  Recent Labs    12/23/20 0408 12/23/20 0435 12/24/20 0100  NA 132* 130* 132*  K 4.3 4.3 4.0  CL 95*  --  95*  CO2 28  --  28  GLUCOSE 223*  --  164*  BUN 12  --  16  CREATININE 1.09  --  1.05  CALCIUM 8.6*  --  8.4*    PT/INR: No results for input(s): LABPROT, INR in the last 72 hours. ABG    Component Value Date/Time   PHART 7.470 (H) 12/23/2020 0435   HCO3 28.7 (H) 12/23/2020 0435   TCO2 30 12/23/2020 0435   ACIDBASEDEF 2.0 12/22/2020 0415   O2SAT 62.4 12/24/2020 0403   CBG (last 3)  Recent Labs    12/24/20 0611 12/24/20 0617 12/24/20 1157  GLUCAP 218* 220* 183*    Assessment/Plan: S/P Procedure(s) (LRB): PLACEMENT OF IMPELLA LEFT VENTRICULAR ASSIST DEVICE (N/A) - Remains on Impella @p9  with flow 5.1 L -waveforms oK Diuresing well The hematoma at the insertion site continues to increase in size. Will take  back to OR for reexploration and evacuation of hematoma Discussed risks, benefits with patient. He is agreeable to proceed.   LOS: 8 days    12/24/2020

## 2020-12-24 NOTE — Anesthesia Procedure Notes (Signed)
Procedure Name: Intubation Date/Time: 12/24/2020 5:36 PM Performed by: Janace Litten, CRNA Pre-anesthesia Checklist: Patient identified, Emergency Drugs available, Suction available and Patient being monitored Patient Re-evaluated:Patient Re-evaluated prior to induction Oxygen Delivery Method: Circle System Utilized Preoxygenation: Pre-oxygenation with 100% oxygen Induction Type: IV induction and Rapid sequence Laryngoscope Size: Mac and 4 Grade View: Grade I Tube type: Oral Tube size: 7.5 mm Number of attempts: 1 Airway Equipment and Method: Stylet Placement Confirmation: ETT inserted through vocal cords under direct vision, positive ETCO2 and breath sounds checked- equal and bilateral Secured at: 23 cm Tube secured with: Tape Dental Injury: Teeth and Oropharynx as per pre-operative assessment

## 2020-12-24 NOTE — Progress Notes (Signed)
Orthopedic Tech Progress Note Patient Details:  Dylan Chaney October 29, 1970 [id number] RN said patient did not require unna boots  Patient ID: Dylan Chaney, male   DOB: April 10, 1970, 50 y.o.   MRN: [id number]  Lovett Calender 12/24/2020, 2:22 PM

## 2020-12-24 NOTE — Interval H&P Note (Signed)
History and Physical Interval Note:  12/24/2020 5:23 PM  Dylan Chaney  has presented today for surgery, with the diagnosis of Hematoma.  The various methods of treatment have been discussed with the patient and family. After consideration of risks, benefits and other options for treatment, the patient has consented to  Procedure(s): EVACUATION HEMATOMA (N/A) as a surgical intervention.  The patient's history has been reviewed, patient examined, no change in status, stable for surgery.  I have reviewed the patient's chart and labs.  Questions were answered to the patient's satisfaction.     Loreli Slot

## 2020-12-24 NOTE — TOC Progression Note (Signed)
Transition of Care [facility]) - Progression Note    Patient Details  Name: Esaw Knippel MRN: [id number] Date of Birth: November 09, 1970  Transition of Care [facility]) CM/SW Contact  Alechia Lezama, LCSWA Phone Number: 12/24/2020, 5:36 PM  Clinical Narrative:    HF CSW received a message from Mr. Shewell sister, Kendal Hymen about the Chaplain helping to complete the Advanced Directive and Last Will and Testament and they have completed their part and the paperwork just needs to be notarized by the Chaplain.   CSW outreached CAFA, Eustace Moore [phone] to follow up regarding the Medicaid for Mr. Duddy however she didn't answer the phone and CSW left a voicemail for her to return the call.   Expected Discharge Plan: Home w Home Health Services Barriers to Discharge: Continued Medical Work up  Expected Discharge Plan and Services Expected Discharge Plan: Home w Home Health Services In-house Referral: Clinical Social Work Discharge Planning Services: CM Consult Post Acute Care Choice: Home Health Living arrangements for the past 2 months: Hotel/Motel                                       Social Determinants of Health (SDOH) Interventions Financial Strain Interventions: Artist Housing Interventions: Other (Comment) (Pt. reports living at a [facility] [address] [place]) Transportation Interventions: [employer] Services  Readmission Risk Interventions No flowsheet data found.  Arya Luttrull, MSW, LCSWA [phone] Heart Failure Social Worker

## 2020-12-24 NOTE — Progress Notes (Signed)
ANTICOAGULATION CONSULT NOTE - Follow Up Consult  Pharmacy Consult for Heparin Indication: Impella/ pulmonary embolus  No Known Allergies  Patient Measurements: Height: 6\' 1"  (185.4 cm) Weight: 87.5 kg (192 lb 14.4 oz) IBW/kg (Calculated) : 79.9 Heparin Dosing Weight: 92.1 kg  Vital Signs: Temp: 98.4 F (36.9 C) (10/14 0615) Temp Source: Oral (10/14 0615) BP: 91/77 (10/14 0900) Pulse Rate: 115 (10/14 0900)  Labs: Recent Labs    12/22/20 0410 12/22/20 0415 12/22/20 1322 12/22/20 1846 12/23/20 0408 12/23/20 0435 12/23/20 1119 12/23/20 1800 12/24/20 0100 12/24/20 0154  HGB 11.1*   < >  --   --  11.1* 11.2*  --   --  11.0*  --   HCT 32.7*   < >  --   --  31.4* 33.0*  --   --  31.4*  --   PLT 79*  --   --   --  74*  --   --   --  74*  --   HEPARINUNFRC <0.10*  --   --    < > <0.10*  --  <0.10* <0.10*  --  <0.10*  CREATININE 1.14  --  1.08  --  1.09  --   --   --  1.05  --    < > = values in this interval not displayed.     Estimated Creatinine Clearance: 94.1 mL/min (by C-G formula based on SCr of 1.05 mg/dL).   Medications:  Infusions:   sodium chloride Stopped (12/23/20 0405)   amiodarone 60 mg/hr (12/24/20 0900)   amiodarone     heparin 50 units/mL (Impella PURGE) in dextrose 5 % 1000 mL bag 10.9 mL/hr at 12/21/20 1100   milrinone 0.5 mcg/kg/min (12/24/20 0800)   norepinephrine (LEVOPHED) Adult infusion 19 mcg/min (12/24/20 0800)     Assessment: 50 yo male with acute bilateral PE, moderate clot burden. Pharmacy consulted to dose heparin drip for PE.  No prior AC noted.  Patient returned from surgery 10/11 s/p impella 5.5 placement. Heparin level subtherapeutic this morning at <0.1 after a rate increase to 600 units/hr systemic heparin. Now, heparin purge solution flowing through impella at a rate of 11.8 mL/hr (590 units/hour). Concentration of purge solution is 50 units/mL. Hgb stable, platelets started to stabilize 79>>74. HIT antibody negative, SRA  negative. Heparin was increased to 950 units/hr overnight leading to heparin level of 0.19. Patient is oozing at the impella site and pharmacy received orders to stop heparin infusion until oozing stops. Will continue to follow plans for resuming drip at a later time.  Goal of Therapy:  Heparin level 0.3-0.5 units/ml for Impella + PE Monitor platelets by anticoagulation protocol: Yes   Plan:  Hold IV heparin Impella purge rate 590 units/hour  Daily CBC Monitor for signs of bleeding   Thank you for allowing pharmacy to participate in this patient's care.  12/11, PharmD PGY1 Pharmacy Resident 12/24/2020 9:31 AM Check [email or web address] for unit specific pharmacy number

## 2020-12-24 NOTE — Progress Notes (Signed)
ANTICOAGULATION CONSULT NOTE - Follow Up Consult  Pharmacy Consult for heparin Indication:  Impella and PE  Labs: Recent Labs    12/22/20 0410 12/22/20 0415 12/22/20 1322 12/22/20 1846 12/23/20 0408 12/23/20 0435 12/23/20 1119 12/23/20 1800 12/24/20 0100 12/24/20 0154  HGB 11.1*   < >  --   --  11.1* 11.2*  --   --  11.0*  --   HCT 32.7*   < >  --   --  31.4* 33.0*  --   --  31.4*  --   PLT 79*  --   --   --  74*  --   --   --  74*  --   HEPARINUNFRC <0.10*  --   --    < > <0.10*  --  <0.10* <0.10*  --  <0.10*  CREATININE 1.14  --  1.08  --  1.09  --   --   --  1.05  --    < > = values in this interval not displayed.     Assessment: 50yo male remains subtherapeutic on heparin after rate change; no infusion issues or signs of bleeding per RN.  Goal of Therapy:  Heparin level 0.3-0.5 units/ml   Plan:  Will increase systemic heparin infusion to 950 units/hr (in addition to purge at ~600 units/hr) and check level in 6 hours.    Vernard Gambles, PharmD, BCPS  12/24/2020,3:00 AM

## 2020-12-24 NOTE — Progress Notes (Addendum)
Advanced Heart Failure Rounding Note   Subjective:    10/11 Impella 5.5 plaed 12/22/20 Impllea turned up to P9 and milrinone increased to 0.5 mcg. Given dose of tolvaptan.   Impella P-9. Flow 5.1. No alarms   LDH 226>290>>298   On NE 19 mcg and milrinone 0.5  Co-ox 62%  -6.5L in UOP yesterday. Wt down 5 lb.   CVP 7-8  Runs of NSVT overnight, longest 20 beats. Also run of SVT ~46min. Currently ST 120s. K 4.0 Mg 2.0   OOB sitting up in chair. Eating breakfast. Appetite is good. Did not sleep well. Continues w/ rt shoulder pain from hematoma. Continues to ooze. Hgb stable at 11. Plts stable at 74K. Heparin level subtherapeutic.   Objective:   Weight Range:  Vital Signs:   Temp:  [98.3 F (36.8 C)-100 F (37.8 C)] 98.4 F (36.9 C) (10/14 0615) Pulse Rate:  [31-162] 117 (10/14 0700) Resp:  [12-25] 19 (10/14 0700) BP: (82-108)/(50-86) 91/78 (10/14 0545) SpO2:  [87 %-100 %] 97 % (10/14 0700) Weight:  [87.5 kg] 87.5 kg (10/14 0600) Last BM Date:  (prior to admit)  Weight change: Filed Weights   12/22/20 0530 12/23/20 0630 12/24/20 0600  Weight: 93.3 kg 89.4 kg 87.5 kg    Intake/Output:   Intake/Output Summary (Last 24 hours) at 12/24/2020 0716 Last data filed at 12/24/2020 0700 Gross per 24 hour  Intake 3529.85 ml  Output 6525 ml  Net -2995.15 ml   PHYSICAL EXAM: CVP 7-8  General:  mildly fatigued appearing, sitting up in chair. No respiratory difficulty HEENT: normal Neck: supple. JVD 8 cm, + Lt CVC. Carotids 2+ bilat; no bruits. No lymphadenopathy or thyromegaly appreciated. Cor: PMI nondisplaced. Regular rhythm tachy rate. No rubs, gallops or murmurs. Lungs: clear Abdomen: soft, nontender, nondistended. No hepatosplenomegaly. No bruits or masses. Good bowel sounds. Extremities: no cyanosis, clubbing, rash, edema +Rt axillary impella w/ hematoma +oozing Neuro: alert & oriented x 3, cranial nerves grossly intact. moves all 4 extremities w/o difficulty.  Affect pleasant.   Telemetry: Sinus Tach 110-120s, NSVT runs longest 20 beats, runs of SVT   Labs: Basic Metabolic Panel: Recent Labs  Lab 12/19/20 0500 12/19/20 1942 12/20/20 0749 12/21/20 0342 12/21/20 0916 12/22/20 0410 12/22/20 0415 12/22/20 1322 12/23/20 0408 12/23/20 0435 12/24/20 0100  NA 128* 126*   < > 126* 126* 123* 122* 127* 132* 130* 132*  K 3.5 3.7   < > 4.3 4.4 4.9 4.8 5.2* 4.3 4.3 4.0  CL 94* 91*   < > 92* 91* 91*  --  94* 95*  --  95*  CO2 26 23   < > 23  --  24  --  25 28  --  28  GLUCOSE 144* 181*   < > 173* 217* 187*  --  212* 223*  --  164*  BUN 21* 20   < > 15 15 11   --  11 12  --  16  CREATININE 1.44* 1.26*   < > 1.12 1.10 1.14  --  1.08 1.09  --  1.05  CALCIUM 8.0* 8.2*   < > 8.5*  --  8.4*  --  8.2* 8.6*  --  8.4*  MG 1.8 2.1  --   --   --   --   --  1.8 2.4  --  2.0   < > = values in this interval not displayed.    Liver Function Tests: Recent Labs  Lab 12/18/20 0330  12/19/20 0500 12/20/20 0749  AST 678* 603* 311*  ALT 1,118* 1,084* 834*  ALKPHOS 36* 36* 36*  BILITOT 4.2* 3.1* 2.9*  PROT 5.5* 5.3* 5.6*  ALBUMIN 3.2* 3.0* 3.2*   No results for input(s): LIPASE, AMYLASE in the last 168 hours. No results for input(s): AMMONIA in the last 168 hours.  CBC: Recent Labs  Lab 12/21/20 0342 12/21/20 0916 12/21/20 1528 12/22/20 0410 12/22/20 0415 12/23/20 0408 12/23/20 0435 12/24/20 0100  WBC 4.7  --  7.1 8.7  --  11.0*  --  12.9*  HGB 12.9*   < > 12.3* 11.1* 11.9* 11.1* 11.2* 11.0*  HCT 37.8*   < > 34.8* 32.7* 35.0* 31.4* 33.0* 31.4*  MCV 82.4  --  81.7 83.8  --  82.0  --  81.8  PLT 84*  --  90* 79*  --  74*  --  74*   < > = values in this interval not displayed.    Cardiac Enzymes: Recent Labs  Lab 12/17/20 1122  CKTOTAL 149  CKMB 3.2    BNP: BNP (last 3 results) Recent Labs    12/15/20 1716 12/17/20 0457  BNP 2,024.7* 1,742.2*    ProBNP (last 3 results) No results for input(s): PROBNP in the last 8760  hours.    Other results:  Imaging: No results found.   Medications:     Scheduled Medications:  (feeding supplement) PROSource Plus  30 mL Oral BID BM   aspirin EC  81 mg Oral Daily   Chlorhexidine Gluconate Cloth  6 each Topical Daily   digoxin  0.125 mg Oral Daily   docusate sodium  100 mg Oral BID   feeding supplement  1 Container Oral TID BM   insulin aspart  0-5 Units Subcutaneous QHS   insulin aspart  0-9 Units Subcutaneous TID WC   mouth rinse  15 mL Mouth Rinse BID   pantoprazole  40 mg Oral Daily   polyethylene glycol  17 g Oral Daily   tamsulosin  0.4 mg Oral QPC supper    Infusions:  sodium chloride Stopped (12/23/20 0405)   heparin 50 units/mL (Impella PURGE) in dextrose 5 % 1000 mL bag 10.9 mL/hr at 12/21/20 1100   heparin 950 Units/hr (12/24/20 0700)   milrinone 0.5 mcg/kg/min (12/24/20 0700)   norepinephrine (LEVOPHED) Adult infusion 19 mcg/min (12/24/20 0700)    PRN Medications: sodium chloride, acetaminophen, bisacodyl, lip balm, ondansetron **OR** ondansetron (ZOFRAN) IV, oxyCODONE, sodium chloride flush   Assessment/Plan:   1. 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 Will need cath +/- cMRI when more stable - Impella 5.5 placed 10/11 - Speed increased to P9 - Remains on milrinone 0.5 mcg + norepi  19 mcg. CO-OX 62%  - CVP 7-8. Hold IV Lasix for now. Careful not to over diuresis w/ RV failure  - Continue digoxin 0.125  - Watch RV. LDH trending up 226>290>298.  - Given social situation advanced therapies would be challenging but if he does not recover with medical therapy, he has extensive support from [facility]. We will work to get him Medicaid to keep transplant option on the table for him. Not VAD candidate with severe RV  dysfunction - HCV quant is negative  - Blood type B+ - Dr  Eara Burruel spoke to Dr. Edwena Blow at [facility]. They would consider him for transplant evaluation (will need Medicaid first).    2. Acute PE  - CTA + bilateral segmental/subsegmental PE likely due to low output - Continue Heparin drip  - PLTs 79>74k  - HIT panel negative.  - SRA negative   3. Acute blood loss anemia - bleeding from Impella site on 10/11 - on heparin gtt for PE + Impella  - hgb 12.9 -> 11.9>11.1>11.0 - Continue  low-dose heparin today   4. Shock liver - improving with hemodynamic support   5. AKI on CKD 3B - baseline SCr 1.8-2.0 - due to ATN from shock  - Creatinine normalized.   6. Thrombocytopenia - PLTs dropping. 84 -> 79 ==>74 today  - HIT panel negative.  - SRA negative    - can switch to bival as needed  7. HCV - viral RNA Quant negative   8. 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 Hyponatremia - 10/12 Tolvaptan  - Sdoium up to 132.   10. NSVT/ SVT  - 20 beat run 10/14 - currently ST 120s - start amio gtt 30/hr while on dual inotropes - keep K > 4.0 and Mg >2.0   11. [facility] needs - Dr Gala Romney spoke with his caseworker April Anders [phone]). He was incarcerated almost a decade ago but not since. He has been homeless for years but recently moved into a hotel in June through the [facility]. 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 No substance use. Always first to volunteer to help other [employer] members move and do other tasks. Has a sister out of state but doesn't her to be contacted until he is improving. [employer] members are very attached to him and said they would drive him back and forth to [facility] for transplant eval,if needed. Other IRC contact: Brother John: [phone]   Robbie Lis, PA-C   12/24/2020   Advanced Heart Failure Team Pager [phone] (M-F; 7a - 5p)  Please contact [facility] for night-coverage after hours (4p -7a ) and weekends on [email or web address]  Agree with above.   Remains on milrinone 0.5, NE 19 and Impella 5.5 at P-9 with flow 5.1 (waveforms ok).   Co-ox 62%. Good diuresis yesterday. CVP 7-8.   Multiple runs NSTV. Has mildly-enlarging hematoma at impella site  General:  Sitting in chair No resp difficulty HEENT: normal Neck: supple. JVP 8Carotids 2+ bilat; no bruits. No lymphadenopathy or thryomegaly appreciated. Cor: Impella site with hematoma Regular tachy No rubs, gallops or murmurs. Lungs: clear Abdomen: soft, nontender, nondistended. No hepatosplenomegaly. No bruits or masses. Good bowel sounds. Extremities: no cyanosis, clubbing, rash, edema + TED Neuro: alert & orientedx3, cranial nerves grossly intact. moves all 4 extremities w/o difficulty. Affect pleasant  He is improving slowly with Impella support but still remains quite tenuous on significant doses of NE and milrinone. Volume status improved with diuresis. With RV dysfunction would not push CVP down lower. Will attempt to wean NE to keep MAP 70-90.    Hold heparin. Likely to OR today for washout of hematoma.   Impella flows look good. May need to cut back to P-8 if LDH rising or more NSVT.   Await Medicaid. Once he has insurance consider transfer to [facility] for transplant  eval.   CRITICAL CARE Performed by: Arvilla Meres  Total critical care time: 45 minutes  Critical care time was exclusive of separately billable procedures and treating other patients.  Critical care was necessary to treat or prevent imminent or life-threatening deterioration.  Critical care was time spent personally by me (independent of midlevel providers or residents) on the following activities: development of treatment plan with patient and/or surrogate as well as nursing, discussions with consultants,  evaluation of patient's response to treatment, examination of patient, obtaining history from patient or surrogate, ordering and performing treatments and interventions, ordering and review of laboratory studies, ordering and review of radiographic studies, pulse oximetry and re-evaluation of patient's condition.  Arvilla Meres, MD  2:56 PM

## 2020-12-25 LAB — BASIC METABOLIC PANEL
Anion gap: 8 (ref 5–15)
Anion gap: 10 (ref 5–15)
BUN: 12 mg/dL (ref 6–20)
BUN: 14 mg/dL (ref 6–20)
CO2: 28 mmol/L (ref 22–32)
CO2: 27 mmol/L (ref 22–32)
Calcium: 8.2 mg/dL — ABNORMAL LOW (ref 8.9–10.3)
Calcium: 8.4 mg/dL — ABNORMAL LOW (ref 8.9–10.3)
Chloride: 90 mmol/L — ABNORMAL LOW (ref 98–111)
Chloride: 91 mmol/L — ABNORMAL LOW (ref 98–111)
Creatinine, Ser: 0.97 mg/dL (ref 0.61–1.24)
Creatinine, Ser: 1.04 mg/dL (ref 0.61–1.24)
GFR, Estimated: >60 mL/min (ref >60)
GFR, Estimated: >60 mL/min (ref >60)
Glucose, Bld: 257 mg/dL — ABNORMAL HIGH (ref 70–99)
Glucose, Bld: 268 mg/dL — ABNORMAL HIGH (ref 70–99)
Potassium: 3.5 mmol/L (ref 3.5–5.1)
Potassium: 4 mmol/L (ref 3.5–5.1)
Sodium: 126 mmol/L — ABNORMAL LOW (ref 135–145)
Sodium: 128 mmol/L — ABNORMAL LOW (ref 135–145)

## 2020-12-25 LAB — COOXEMETRY PANEL
Carboxyhemoglobin: 1.4 % (ref 0.5–1.5)
Methemoglobin: 1.1 % (ref 0.0–1.5)
O2 Saturation: 57.2 %
Total hemoglobin: 10.7 g/dL — ABNORMAL LOW (ref 12.0–16.0)

## 2020-12-25 LAB — GLUCOSE, CAPILLARY
Glucose-Capillary: 200 mg/dL — ABNORMAL HIGH (ref 70–99)
Glucose-Capillary: 284 mg/dL — ABNORMAL HIGH (ref 70–99)
Glucose-Capillary: 203 mg/dL — ABNORMAL HIGH (ref 70–99)
Glucose-Capillary: 331 mg/dL — ABNORMAL HIGH (ref 70–99)
Glucose-Capillary: 299 mg/dL — ABNORMAL HIGH (ref 70–99)

## 2020-12-25 LAB — CBC
HCT: 30.2 % — ABNORMAL LOW (ref 39.0–52.0)
Hemoglobin: 10.3 g/dL — ABNORMAL LOW (ref 13.0–17.0)
MCH: 28.1 pg (ref 26.0–34.0)
MCHC: 34.1 g/dL (ref 30.0–36.0)
MCV: 82.5 fL (ref 80.0–100.0)
Platelets: 120 10*3/uL — ABNORMAL LOW (ref 150–400)
RBC: 3.66 MIL/uL — ABNORMAL LOW (ref 4.22–5.81)
RDW: 13.8 % (ref 11.5–15.5)
WBC: 12.6 10*3/uL — ABNORMAL HIGH (ref 4.0–10.5)
nRBC: 0 % (ref 0.0–0.2)

## 2020-12-25 LAB — PREPARE PLATELET PHERESIS: Unit division: 0

## 2020-12-25 LAB — LACTATE DEHYDROGENASE: LDH: 304 U/L — ABNORMAL HIGH (ref 98–192)

## 2020-12-25 LAB — BPAM PLATELET PHERESIS
Blood Product Expiration Date: 202210162359
ISSUE DATE / TIME: 202210141742
Unit Type and Rh: 5100

## 2020-12-25 LAB — DIGOXIN LEVEL: Digoxin Level: 0.4 ng/mL — ABNORMAL LOW (ref 0.8–2.0)

## 2020-12-25 LAB — HEPARIN LEVEL (UNFRACTIONATED): Heparin Unfractionated: <0.1 IU/mL — ABNORMAL LOW (ref 0.30–0.70)

## 2020-12-25 MED ORDER — TORSEMIDE 20 MG PO TABS
40.0000 mg | ORAL_TABLET | Freq: Every day | ORAL | Status: DC
  Administered 2020-12-25: 40 mg via ORAL
  Filled 2020-12-25: qty 2

## 2020-12-25 MED ORDER — AMIODARONE LOAD VIA INFUSION
150.0000 mg | Freq: Once | INTRAVENOUS | Status: AC
  Administered 2020-12-25: 150 mg via INTRAVENOUS
  Filled 2020-12-25: qty 83.34

## 2020-12-25 MED ORDER — TOLVAPTAN 15 MG PO TABS
15.0000 mg | ORAL_TABLET | Freq: Once | ORAL | Status: AC
  Administered 2020-12-25: 15 mg via ORAL
  Filled 2020-12-25: qty 1

## 2020-12-25 MED ORDER — HEPARIN (PORCINE) 25000 UT/250ML-% IV SOLN
600.0000 [IU]/h | INTRAVENOUS | Status: DC
  Administered 2020-12-25: 600 [IU]/h via INTRAVENOUS
  Filled 2020-12-25: qty 250

## 2020-12-25 MED ORDER — POTASSIUM CHLORIDE CRYS ER 20 MEQ PO TBCR
40.0000 meq | EXTENDED_RELEASE_TABLET | Freq: Two times a day (BID) | ORAL | Status: AC
  Administered 2020-12-25 (×2): 40 meq via ORAL
  Filled 2020-12-25 (×2): qty 2

## 2020-12-25 MED ORDER — INSULIN GLARGINE-YFGN 100 UNIT/ML ~~LOC~~ SOLN
7.0000 [IU] | Freq: Every day | SUBCUTANEOUS | Status: DC
  Administered 2020-12-25: 7 [IU] via SUBCUTANEOUS
  Filled 2020-12-25 (×2): qty 0.07

## 2020-12-25 NOTE — Progress Notes (Signed)
Patient's contact, Jorge Ny, picked up patient's watch from [facility] ICU.

## 2020-12-25 NOTE — Progress Notes (Signed)
Patient ID: Dylan Chaney, male   DOB: [birth year], 50 y.o.   MRN: [id number]    Advanced Heart Failure Rounding Note   Subjective:    - 10/11 Impella 5.5 placed - 12/22/20 Impllea turned up to P9 and milrinone increased to 0.5 mcg. Given dose of tolvaptan.  - 10/14 To OR for Impella site hematoma washout.   Impella P-9. Flow 4.8. No alarms. Heparin in purge only currently.   LDH 226>290>>298>>304  On NE 25 mcg and milrinone 0.5. Co-ox 57%  I/Os positive yesterday, no diuretics.  CVP 9-10 this morning. Na down to 126.   2 runs NSVT last night.  Patient remains on amiodarone 30 mg/hr.  K 3.5.   Hgb 10.3 today, plts up to 120.  Of note, HIT Ab and SRA negative.   OOB sitting up in chair. Eating breakfast.   Objective:   Weight Range:  Vital Signs:   Temp:  [98 F (36.7 C)-98.8 F (37.1 C)] 98 F (36.7 C) (10/15 0400) Pulse Rate:  [40-212] 79 (10/15 0700) Resp:  [12-25] 25 (10/15 0700) BP: (63-108)/(30-88) 108/73 (10/15 0700) SpO2:  [91 %-100 %] 99 % (10/15 0700) Weight:  [90.7 kg] 90.7 kg (10/15 0700) Last BM Date:  (prior to admit)  Weight change: Filed Weights   12/23/20 0630 12/24/20 0600 12/25/20 0700  Weight: 89.4 kg 87.5 kg 90.7 kg    Intake/Output:   Intake/Output Summary (Last 24 hours) at 12/25/2020 0800 Last data filed at 12/25/2020 0700 Gross per 24 hour  Intake 3322.33 ml  Output 1840 ml  Net 1482.33 ml   PHYSICAL EXAM: CVP 9-10 General: NAD Neck: JVP 10 cm, no thyromegaly or thyroid nodule.  Lungs: Clear to auscultation bilaterally with normal respiratory effort. CV: Nondisplaced PMI.  Heart regular S1/S2, no S3/S4, Impella sounds.  1+ edema 1/2 to knees bilaterally, 1+ right forearm edema.  Abdomen: Soft, nontender, no hepatosplenomegaly, no distention.  Skin: Intact without lesions or rashes.  Neurologic: Alert and oriented x 3.  Psych: Normal affect. Extremities: No clubbing or cyanosis.  HEENT: Normal.    Telemetry: NSR 100s, 2 short  NSVT runs (personally reviewed)   Labs: Basic Metabolic Panel: Recent Labs  Lab 12/19/20 0500 12/19/20 1942 12/20/20 0749 12/22/20 0410 12/22/20 0415 12/22/20 1322 12/23/20 0408 12/23/20 0435 12/24/20 0100 12/24/20 1744 12/25/20 0448  NA 128* 126*   < > 123*   < > 127* 132* 130* 132* 132* 126*  K 3.5 3.7   < > 4.9   < > 5.2* 4.3 4.3 4.0 4.4 3.5  CL 94* 91*   < > 91*  --  94* 95*  --  95* 89* 90*  CO2 26 23   < > 24  --  25 28  --  28  --  28  GLUCOSE 144* 181*   < > 187*  --  212* 223*  --  164* 181* 257*  BUN 21* 20   < > 11  --  11 12  --  16 18 12   CREATININE 1.44* 1.26*   < > 1.14  --  1.08 1.09  --  1.05 1.00 0.97  CALCIUM 8.0* 8.2*   < > 8.4*  --  8.2* 8.6*  --  8.4*  --  8.2*  MG 1.8 2.1  --   --   --  1.8 2.4  --  2.0  --   --    < > = values in this interval not displayed.  Liver Function Tests: Recent Labs  Lab 12/19/20 0500 12/20/20 0749  AST 603* 311*  ALT 1,084* 834*  ALKPHOS 36* 36*  BILITOT 3.1* 2.9*  PROT 5.3* 5.6*  ALBUMIN 3.0* 3.2*   No results for input(s): LIPASE, AMYLASE in the last 168 hours. No results for input(s): AMMONIA in the last 168 hours.  CBC: Recent Labs  Lab 12/22/20 0410 12/22/20 0415 12/23/20 0408 12/23/20 0435 12/24/20 0100 12/24/20 1536 12/24/20 1744 12/25/20 0448  WBC 8.7  --  11.0*  --  12.9* 15.5*  --  12.6*  HGB 11.1*   < > 11.1* 11.2* 11.0* 11.2* 13.6 10.3*  HCT 32.7*   < > 31.4* 33.0* 31.4* 32.4* 40.0 30.2*  MCV 83.8  --  82.0  --  81.8 82.2  --  82.5  PLT 79*  --  74*  --  74* 94*  --  120*   < > = values in this interval not displayed.    Cardiac Enzymes: No results for input(s): CKTOTAL, CKMB, CKMBINDEX, TROPONINI in the last 168 hours.   BNP: BNP (last 3 results) Recent Labs    12/15/20 1716 12/17/20 0457  BNP 2,024.7* 1,742.2*    ProBNP (last 3 results) No results for input(s): PROBNP in the last 8760 hours.    Other results:  Imaging: DG Chest Port 1 View  Result Date:  12/24/2020 CLINICAL DATA:  Atelectasis. EXAM: PORTABLE CHEST 1 VIEW COMPARISON:  12/22/2020 FINDINGS: 0539 hours. Low lung volumes. The cardio pericardial silhouette is enlarged. Bibasilar atelectasis noted with vascular congestion. Left IJ central line tip overlies the distal SVC level. Impella device again noted. Telemetry leads overlie the chest. IMPRESSION: Low volume film with vascular congestion and bibasilar atelectasis. Electronically Signed   By: Kennith Center M.D.   On: 12/24/2020 08:55     Medications:     Scheduled Medications:  (feeding supplement) PROSource Plus  30 mL Oral BID BM   aspirin EC  81 mg Oral Daily   Chlorhexidine Gluconate Cloth  6 each Topical Daily   digoxin  0.125 mg Oral Daily   docusate sodium  100 mg Oral BID   feeding supplement  1 Container Oral TID BM   insulin aspart  0-5 Units Subcutaneous QHS   insulin aspart  0-9 Units Subcutaneous TID WC   insulin glargine-yfgn  5 Units Subcutaneous QHS   mouth rinse  15 mL Mouth Rinse BID   pantoprazole  40 mg Oral Daily   polyethylene glycol  17 g Oral Daily   potassium chloride  40 mEq Oral BID   tamsulosin  0.4 mg Oral QPC supper   torsemide  40 mg Oral Daily    Infusions:  sodium chloride Stopped (12/23/20 0405)   amiodarone 30 mg/hr (12/25/20 0700)   heparin 50 units/mL (Impella PURGE) in dextrose 5 % 1000 mL bag 10.9 mL/hr at 12/21/20 1100   milrinone 0.5 mcg/kg/min (12/25/20 0700)   norepinephrine (LEVOPHED) Adult infusion 25 mcg/min (12/25/20 0700)    PRN Medications: sodium chloride, acetaminophen, bisacodyl, lip balm, ondansetron **OR** ondansetron (ZOFRAN) IV, oxyCODONE, sodium chloride flush, traMADol   Assessment/Plan:   1. 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  Will need cath +/- cMRI when more stable - Impella 5.5 placed  10/11.  Speed increased to P9. Would continue current speed.  Getting heparin in purge, systemic heparin held for hematoma evacuation yesterday.  Per Dr. Sunday Corn note, will need at least 24 hrs off heparin.  Restart heparin systemically when ok with TCTS.  - Remains on milrinone 0.5 mcg + norepi 25 mcg. CO-OX 57%. Continue current support.  - CVP 9-10.  He did not get diuretic yesterday => CVP not markedly high but sitting in chair, and weight is up. Trying to be careful with RV dysfunction to avoid dropping CVP too far.  Will start torsemide 40 mg daily today but with Na 126, will also give a dose of tolvaptan 15 mg x 1.   - Continue digoxin 0.125, level ok - Watch RV. LDH trending up 226>290>298>304 but also to OR yesterday with hematoma evacuation.  Impella without alarms.  - Given social situation advanced therapies would be challenging but if he does not recover with medical therapy, he has extensive support from [facility]. We will work to get him Medicaid to keep transplant option on the table for him. Not VAD candidate with severe RV dysfunction - HCV quant is negative  - Blood type B+ - Dr Gala Romney spoke to Dr. Edwena Blow at [facility]. They would consider him for transplant evaluation (will need Medicaid first).    2. Acute PE  - CTA + bilateral segmental/subsegmental PE likely due to low output - Restart heparin gtt when cleared by TCTS => stopped for hematoma evacuation yesterday evening.   - PLTs 79>74k>94>120.   - HIT panel negative.  - SRA negative   3. Acute blood loss anemia - bleeding from Impella site on 10/11 => heparin stopped 10/14 and patient had hematoma evacuation by Dr. Dorris Fetch.   - had been on heparin gtt for PE + Impella  - hgb stable at 10.3.  - Restart systemic heparin when cleared by TCTS.    4. Shock liver - improving with hemodynamic support   5. AKI on CKD 3B - baseline SCr 1.8-2.0 - due to ATN from  shock  - Creatinine normalized.   6. Thrombocytopenia - Platelets starting to increase, up to 120 today.  - HIT panel negative.  - SRA negative    - Doubt significant hemolysis at this point (stable LDH), no evidence for HIT.  Can resume heparin systemically when cleared by TCTS.   7. HCV - viral RNA Quant negative   8. 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 Hyponatremia - 10/12 Tolvaptan  - Na 126, tolvaptan today.   10. NSVT/ SVT  - 2 runs last night.  - Continue amio gtt 30/hr while on dual inotropes - keep K > 4.0 and Mg >2.0   11. SDoH needs - Dr Gala Romney spoke with his caseworker April Anders [phone]). He was incarcerated almost a decade ago but not since. He has been homeless for years but recently moved into a hotel in June through the [facility]. 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 No substance use. Always first to volunteer to help other [employer] members move and do other tasks. Has a sister out of state but doesn't her to be contacted until he is improving. IRC members are very attached to him and said they would drive him back and forth  to [facility] for transplant eval,if needed. Other IRC contact: Brother John: [phone]   As above, resume heparin gtt when cleared by TCTS (PE, Impella).   Await Medicaid. Once he has insurance consider transfer to [facility] for transplant eval.   CRITICAL CARE Performed by: Marca Ancona  Total critical care time: 45 minutes  Critical care time was exclusive of separately billable procedures and treating other patients.  Critical care was necessary to treat or prevent imminent or life-threatening deterioration.  Critical care was time spent personally by me (independent of midlevel providers or residents) on the following activities: development of  treatment plan with patient and/or surrogate as well as nursing, discussions with consultants, evaluation of patient's response to treatment, examination of patient, obtaining history from patient or surrogate, ordering and performing treatments and interventions, ordering and review of laboratory studies, ordering and review of radiographic studies, pulse oximetry and re-evaluation of patient's condition.  Marca Ancona, MD  8:00 AM

## 2020-12-25 NOTE — Progress Notes (Signed)
ANTICOAGULATION CONSULT NOTE - Follow Up Consult  Pharmacy Consult for Heparin Indication: Impella/ pulmonary embolus  No Known Allergies  Patient Measurements: Height: 6\' 1"  (185.4 cm) Weight: 90.7 kg (199 lb 15.3 oz) IBW/kg (Calculated) : 79.9 Heparin Dosing Weight: 92.1 kg  Vital Signs: Temp: 99.1 F (37.3 C) (10/15 1545) Temp Source: Oral (10/15 1545) BP: 93/79 (10/15 1545) Pulse Rate: 105 (10/15 1545)  Labs: Recent Labs    12/24/20 0100 12/24/20 0154 12/24/20 0900 12/24/20 1536 12/24/20 1744 12/25/20 0448 12/25/20 1400  HGB 11.0*  --   --  11.2* 13.6 10.3*  --   HCT 31.4*  --   --  32.4* 40.0 30.2*  --   PLT 74*  --   --  94*  --  120*  --   HEPARINUNFRC  --  <0.10* 0.19*  --   --  <0.10*  --   CREATININE 1.05  --   --   --  1.00 0.97 1.04     Estimated Creatinine Clearance: 95 mL/min (by C-G formula based on SCr of 1.04 mg/dL).   Medications:  Infusions:   sodium chloride Stopped (12/23/20 0405)   amiodarone 30 mg/hr (12/25/20 1500)   heparin 50 units/mL (Impella PURGE) in dextrose 5 % 1000 mL bag 10.9 mL/hr at 12/21/20 1100   milrinone 0.5 mcg/kg/min (12/25/20 1600)   norepinephrine (LEVOPHED) Adult infusion 26 mcg/min (12/25/20 1600)     Assessment: 50 yo male with acute bilateral PE, moderate clot burden. Pharmacy consulted to dose heparin drip for PE.  No prior AC noted.  Patient returned from surgery 10/11 s/p impella 5.5 placement. Patient with oozing and continued increase in size of hematoma. Patient taken back to OR for evacuation 10/14. Surgery ok with resuming systemic heparin this afternoon. Pressure dressing intact, has not bled through on exam.   Impella purge providing 580 units/hr of heparin. Patient was close to goal with systemic heparin at 950 units/hr. Will start at lower dose and titrate up according to bleeding and levels.  Goal of Therapy:  Heparin level 0.3-0.5 units/ml for Impella + PE Monitor platelets by anticoagulation  protocol: Yes   Plan:  Resume IV heparin at low rate of 600 units/hr Recheck heparin level in am Impella purge providing 580 units/hour of heparin  Daily CBC Monitor for signs of bleeding  Thank you for allowing pharmacy to participate in this patient's care.  11/14 PharmD., BCPS Clinical Pharmacist 12/25/2020 5:02 PM

## 2020-12-25 NOTE — Op Note (Signed)
Dylan Chaney, [place] MEDICAL RECORD NO: [id number] ACCOUNT NO: [id number] DATE OF BIRTH: August 24, 1970 FACILITY: [facility] LOCATION: [facility] PHYSICIAN: Salvatore Decent. Dorris Fetch, MD  Operative Report   DATE OF PROCEDURE: 12/24/2020  PREOPERATIVE DIAGNOSIS:  Hematoma post Impella placement.  POSTOPERATIVE DIAGNOSIS:  Hematoma post Impella placement.  PROCEDURE:  Evacuation of hematoma from right subclavian area.  SURGEON:  Salvatore Decent. Dorris Fetch, MD  ASSISTANT:  None.  ANESTHESIA:  General.  FINDINGS:  Extensive hematoma, multiple small bleeding sites.  No bleeding at the anastomosis.  CLINICAL NOTE:  [name] is a 50 year old gentleman who presented with congestive heart failure.  He was found to be in cardiogenic shock with severe left ventricular dysfunction.  He had placement of an Impella left ventricular assist device on  12/21/2020.  Postoperatively, he developed a hematoma in that area and the hematoma had gradually increased in size. It was felt he needed to be reexplored for evacuation of the hematoma and control of any bleeding sites.  Indications, risks, benefits  and alternatives were discussed in detail with the patient.  He accepted the risks and agreed to proceed.  DESCRIPTION OF PROCEDURE:  [name] was brought to the operating room on 12/24/2020.  He was anesthetized and intubated.  Intravenous vancomycin was administered.  Platelets were transfused.  The chest and neck were prepped and draped in the usual  sterile fashion.  A timeout was performed.  The right subclavian incision was opened.  There was hematoma at multiple levels in the wound.  The clot was evacuated and the wound was copiously irrigated with saline.  There was no distinct bleeding vessel noted.  The anastomosis was carefully inspected.   The old Surgicel was removed and there were no bleeding sites at the anastomosis.  There were multiple small oozing vessels that were cauterized and a couple of small vessels  that were clipped, but no culprit bleeder could be identified.  The wound was  copiously irrigated on multiple occasions.  Topical Gelfoam powder was applied.  The wound then was closed in 3 layers.  The Impella graft was incorporated into the incision.  The skin was closed with staples.  All sponge, needle  and instrument counts were correct at the end of the procedure. The patient then was taken from the operating room to the ICU in good condition on Impella support.          PAA D: 12/24/2020 6:50:13 pm T: 12/25/2020 3:41:00 am  JOB: [id number]/ [id number]

## 2020-12-25 NOTE — Anesthesia Postprocedure Evaluation (Signed)
Anesthesia Post Note  Patient: Dylan Chaney  Procedure(s) Performed: EVACUATION HEMATOMA     Patient location during evaluation: SICU Anesthesia Type: General Level of consciousness: patient remains intubated per anesthesia plan Pain management: pain level controlled Vital Signs Assessment: post-procedure vital signs reviewed and stable Respiratory status: patient remains intubated per anesthesia plan Postop Assessment: no apparent nausea or vomiting Anesthetic complications: no   No notable events documented.  Last Vitals:  Vitals:   12/25/20 1005 12/25/20 1100  BP: 95/67   Pulse: (!) 113   Resp: (!) 22   Temp:  37.1 C  SpO2: 100%     Last Pain:  Vitals:   12/25/20 1200  TempSrc:   PainSc: 6                  Pedro Oldenburg

## 2020-12-26 LAB — HEPARIN LEVEL (UNFRACTIONATED): Heparin Unfractionated: <0.1 IU/mL — ABNORMAL LOW (ref 0.30–0.70)

## 2020-12-26 LAB — CBC
HCT: 27.9 % — ABNORMAL LOW (ref 39.0–52.0)
HCT: 29.5 % — ABNORMAL LOW (ref 39.0–52.0)
Hemoglobin: 9.7 g/dL — ABNORMAL LOW (ref 13.0–17.0)
Hemoglobin: 10.1 g/dL — ABNORMAL LOW (ref 13.0–17.0)
MCH: 28.5 pg (ref 26.0–34.0)
MCH: 28.1 pg (ref 26.0–34.0)
MCHC: 34.8 g/dL (ref 30.0–36.0)
MCHC: 34.2 g/dL (ref 30.0–36.0)
MCV: 82.1 fL (ref 80.0–100.0)
MCV: 81.9 fL (ref 80.0–100.0)
Platelets: 127 10*3/uL — ABNORMAL LOW (ref 150–400)
Platelets: 143 10*3/uL — ABNORMAL LOW (ref 150–400)
RBC: 3.4 MIL/uL — ABNORMAL LOW (ref 4.22–5.81)
RBC: 3.6 MIL/uL — ABNORMAL LOW (ref 4.22–5.81)
RDW: 13.5 % (ref 11.5–15.5)
RDW: 13.4 % (ref 11.5–15.5)
WBC: 11.1 10*3/uL — ABNORMAL HIGH (ref 4.0–10.5)
WBC: 11.6 10*3/uL — ABNORMAL HIGH (ref 4.0–10.5)
nRBC: 0 % (ref 0.0–0.2)
nRBC: 0 % (ref 0.0–0.2)

## 2020-12-26 LAB — GLUCOSE, CAPILLARY
Glucose-Capillary: 260 mg/dL — ABNORMAL HIGH (ref 70–99)
Glucose-Capillary: 233 mg/dL — ABNORMAL HIGH (ref 70–99)
Glucose-Capillary: 376 mg/dL — ABNORMAL HIGH (ref 70–99)
Glucose-Capillary: 157 mg/dL — ABNORMAL HIGH (ref 70–99)

## 2020-12-26 LAB — COOXEMETRY PANEL
Carboxyhemoglobin: 1.8 % — ABNORMAL HIGH (ref 0.5–1.5)
Methemoglobin: 0.9 % (ref 0.0–1.5)
O2 Saturation: 55.9 %
Total hemoglobin: 9.9 g/dL — ABNORMAL LOW (ref 12.0–16.0)

## 2020-12-26 LAB — BASIC METABOLIC PANEL
Anion gap: 12 (ref 5–15)
BUN: 17 mg/dL (ref 6–20)
CO2: 24 mmol/L (ref 22–32)
Calcium: 8.2 mg/dL — ABNORMAL LOW (ref 8.9–10.3)
Chloride: 91 mmol/L — ABNORMAL LOW (ref 98–111)
Creatinine, Ser: 1.04 mg/dL (ref 0.61–1.24)
GFR, Estimated: >60 mL/min (ref >60)
Glucose, Bld: 278 mg/dL — ABNORMAL HIGH (ref 70–99)
Potassium: 3.8 mmol/L (ref 3.5–5.1)
Sodium: 127 mmol/L — ABNORMAL LOW (ref 135–145)

## 2020-12-26 LAB — LACTATE DEHYDROGENASE: LDH: 289 U/L — ABNORMAL HIGH (ref 98–192)

## 2020-12-26 MED ORDER — POTASSIUM CHLORIDE CRYS ER 20 MEQ PO TBCR
40.0000 meq | EXTENDED_RELEASE_TABLET | Freq: Once | ORAL | Status: AC
  Administered 2020-12-26: 40 meq via ORAL
  Filled 2020-12-26: qty 2

## 2020-12-26 MED ORDER — INSULIN GLARGINE-YFGN 100 UNIT/ML ~~LOC~~ SOLN
15.0000 [IU] | Freq: Every day | SUBCUTANEOUS | Status: DC
  Filled 2020-12-26: qty 0.15

## 2020-12-26 MED ORDER — LIP MEDEX EX OINT
TOPICAL_OINTMENT | CUTANEOUS | Status: DC | PRN
  Filled 2020-12-26 (×2): qty 7

## 2020-12-26 MED ORDER — HEPARIN (PORCINE) 25000 UT/250ML-% IV SOLN
600.0000 [IU]/h | INTRAVENOUS | Status: DC
  Administered 2020-12-27 – 2021-01-05 (×7): 600 [IU]/h via INTRAVENOUS
  Filled 2020-12-26 (×6): qty 250

## 2020-12-26 MED ORDER — HEPARIN (PORCINE) 25000 UT/250ML-% IV SOLN
600.0000 [IU]/h | INTRAVENOUS | Status: DC
  Filled 2020-12-26: qty 250

## 2020-12-26 MED ORDER — INSULIN GLARGINE-YFGN 100 UNIT/ML ~~LOC~~ SOLN
15.0000 [IU] | Freq: Every day | SUBCUTANEOUS | Status: DC
  Administered 2020-12-26 – 2020-12-27 (×2): 15 [IU] via SUBCUTANEOUS
  Filled 2020-12-26 (×2): qty 0.15

## 2020-12-26 MED ORDER — TORSEMIDE 20 MG PO TABS
60.0000 mg | ORAL_TABLET | Freq: Every day | ORAL | Status: DC
  Administered 2020-12-26: 60 mg via ORAL
  Filled 2020-12-26: qty 3

## 2020-12-26 NOTE — Progress Notes (Signed)
     [address]       [name] [address]             [phone]       No events Heparin restarted Cannulation site flat and stable Labs stable  Broderic Bara O Luisdaniel Dylan Chaney

## 2020-12-26 NOTE — Progress Notes (Signed)
RN removed pressure dressing to change after patient's bath. RN noted hematoma at impella site about 10 cm in diameter. Manual pressure applied for about 20 minutes to the hematoma site. Shirlee Latch MD Notified. See new orders. Heparin stopped for 6 hours per Dr. Shirlee Latch. Stat CBC obtained. Pressure dressing reapplied. No active external bleeding at the site of the impella.   Pt also noted to have a pocketed space that is firm on the distal, medial upper right arm. Medial and posterior side is discolored and bruised. Pressure applied to arm site. Arm became soft. Shirlee Latch MD notified. Okay to wrap with coband to apply pressure per Shirlee Latch MD.    Tenna Child RN

## 2020-12-26 NOTE — Plan of Care (Signed)
  Problem: Education: Goal: Knowledge of General Education information will improve Description: Including pain rating scale, medication(s)/side effects and non-pharmacologic comfort measures Outcome: Progressing   Problem: Health Behavior/Discharge Planning: Goal: Ability to manage health-related needs will improve Outcome: Progressing   Problem: Clinical Measurements: Goal: Ability to maintain clinical measurements within normal limits will improve Outcome: Progressing Goal: Will remain free from infection Outcome: Progressing Goal: Diagnostic test results will improve Outcome: Progressing Goal: Respiratory complications will improve Outcome: Progressing Goal: Cardiovascular complication will be avoided Outcome: Progressing   Problem: Activity: Goal: Risk for activity intolerance will decrease Outcome: Progressing   Problem: Nutrition: Goal: Adequate nutrition will be maintained Outcome: Progressing   Problem: Coping: Goal: Level of anxiety will decrease Outcome: Progressing   Problem: Elimination: Goal: Will not experience complications related to bowel motility Outcome: Progressing Goal: Will not experience complications related to urinary retention Outcome: Progressing   Problem: Pain Managment: Goal: General experience of comfort will improve Outcome: Progressing   Problem: Safety: Goal: Ability to remain free from injury will improve Outcome: Progressing   Problem: Skin Integrity: Goal: Risk for impaired skin integrity will decrease Outcome: Progressing   Problem: Education: Goal: Ability to demonstrate management of disease process will improve Outcome: Progressing Goal: Ability to verbalize understanding of medication therapies will improve Outcome: Progressing   Problem: Activity: Goal: Capacity to carry out activities will improve Outcome: Progressing   Problem: Cardiac: Goal: Ability to achieve and maintain adequate cardiopulmonary perfusion  will improve Outcome: Progressing   Problem: Education: Goal: Knowledge of cardiac device and self-care will improve Outcome: Progressing Goal: Ability to safely manage health related needs after discharge will improve Outcome: Progressing   Problem: Cardiac: Goal: Ability to achieve and maintain adequate cardiopulmonary perfusion will improve Outcome: Progressing

## 2020-12-26 NOTE — Progress Notes (Signed)
ANTICOAGULATION CONSULT NOTE - Follow Up Consult  Pharmacy Consult for Heparin Indication: Impella/ pulmonary embolus  No Known Allergies  Patient Measurements: Height: 6\' 1"  (185.4 cm) Weight: 90.7 kg (199 lb 15.3 oz) IBW/kg (Calculated) : 79.9 Heparin Dosing Weight: 92.1 kg  Vital Signs: Temp: 98.4 F (36.9 C) (10/15 2342) Temp Source: Oral (10/15 2342) BP: 112/99 (10/16 0500) Pulse Rate: 43 (10/16 0500)  Labs: Recent Labs    12/24/20 0900 12/24/20 1536 12/24/20 1744 12/25/20 0448 12/25/20 1400 12/26/20 0445  HGB  --  11.2* 13.6 10.3*  --  9.7*  HCT  --  32.4* 40.0 30.2*  --  27.9*  PLT  --  94*  --  120*  --  127*  HEPARINUNFRC 0.19*  --   --  <0.10*  --  <0.10*  CREATININE  --   --  1.00 0.97 1.04  --      Estimated Creatinine Clearance: 95 mL/min (by C-G formula based on SCr of 1.04 mg/dL).   Medications:  Infusions:   sodium chloride Stopped (12/23/20 0405)   amiodarone 30 mg/hr (12/26/20 0500)   heparin 50 units/mL (Impella PURGE) in dextrose 5 % 1000 mL bag 10.9 mL/hr at 12/21/20 1100   heparin 600 Units/hr (12/26/20 0500)   milrinone 0.5 mcg/kg/min (12/26/20 0500)   norepinephrine (LEVOPHED) Adult infusion 26 mcg/min (12/26/20 0500)     Assessment: 50 yo male with acute bilateral PE, moderate clot burden. Pharmacy consulted to dose heparin drip for PE.  No prior AC noted.  Patient returned from surgery 10/11 s/p impella 5.5 placement. Patient with oozing and continued increase in size of hematoma. Patient taken back to OR for evacuation 10/14. Surgery ok with resuming systemic heparin 10/15.   Impella purge providing 575 units/hr of heparin. Patient was close to goal with systemic heparin at 950 units/hr. Restarted at lower dose and titrated up according to bleeding and levels.  RN states that no overt bleeding but arm is swollen where impella inserted. Surgery to evaluate when they arrive this morning but want to keep heparin at current rate for the  time being.  Goal of Therapy:  Heparin level 0.3-0.5 units/ml for Impella + PE Monitor platelets by anticoagulation protocol: Yes   Plan:  Continue IV heparin 600 units/hr. F/u with surgery about titraing heparin  Thank you for allowing pharmacy to participate in this patient's care.  11/15, PharmD, BCPS Please see amion for complete clinical pharmacist phone list 12/26/2020 5:24 AM

## 2020-12-26 NOTE — Progress Notes (Signed)
Patient ID: Dylan Chaney, male   DOB: [birth year], 50 y.o.   MRN: [id number]    Advanced Heart Failure Rounding Note   Subjective:    - 10/11 Impella 5.5 placed - 12/22/20 Impllea turned up to P9 and milrinone increased to 0.5 mcg. Given dose of tolvaptan.  - 10/14 To OR for Impella site hematoma washout.   Impella P-9. Flow 5.1. No alarms. Now back on systemic heparin.   LDH 226>290>>298>>304>>289.  Urine clear.   On NE 25 mcg and milrinone 0.5. Co-ox 56% (stable).   I/Os net negative 3146, good diuresis with po torsemide and tolvaptan yesterday.  CVP 11-12 today.   3 short runs NSVT since yesterday am.  Patient remains on amiodarone 30 mg/hr.   Hgb 9.7 today, plts up to 127.  Of note, HIT Ab and SRA negative.   OOB sitting up in chair. Walked in hall yesterday.  Feels good, denies dyspnea.   Right arm remains swollen, patient says that it feels better and is not as tight.   Objective:   Weight Range:  Vital Signs:   Temp:  [98.4 F (36.9 C)-99.1 F (37.3 C)] 98.4 F (36.9 C) (10/15 2342) Pulse Rate:  [25-144] 116 (10/16 0700) Resp:  [13-31] 18 (10/16 0700) BP: (66-164)/(20-127) 106/65 (10/16 0630) SpO2:  [82 %-100 %] 94 % (10/16 0700) Last BM Date:  (PTA)  Weight change: Filed Weights   12/23/20 0630 12/24/20 0600 12/25/20 0700  Weight: 89.4 kg 87.5 kg 90.7 kg    Intake/Output:   Intake/Output Summary (Last 24 hours) at 12/26/2020 0733 Last data filed at 12/26/2020 0700 Gross per 24 hour  Intake 4103.99 ml  Output 7250 ml  Net -3146.01 ml   PHYSICAL EXAM: CVP 11-12 General: NAD Neck: JVP 10-12 cm, no thyromegaly or thyroid nodule.  Lungs: Clear to auscultation bilaterally with normal respiratory effort. CV: Lateral PMI.  Heart regular S1/S2, Impella sounds.  1+ edema to knees.  Abdomen: Soft, nontender, no hepatosplenomegaly, no distention.  Skin: Intact without lesions or rashes.  Neurologic: Alert and oriented x 3.  Psych: Normal  affect. Extremities: No clubbing or cyanosis. Right arm swelling, radial pulse can be dopplered.  HEENT: Normal.   Telemetry: NSR 100s, 3 short NSVT runs (personally reviewed)   Labs: Basic Metabolic Panel: Recent Labs  Lab 12/19/20 1942 12/20/20 0749 12/22/20 1322 12/23/20 0408 12/23/20 0435 12/24/20 0100 12/24/20 1744 12/25/20 0448 12/25/20 1400 12/26/20 0445  NA 126*   < > 127* 132*   < > 132* 132* 126* 128* 127*  K 3.7   < > 5.2* 4.3   < > 4.0 4.4 3.5 4.0 3.8  CL 91*   < > 94* 95*  --  95* 89* 90* 91* 91*  CO2 23   < > 25 28  --  28  --  28 27 24   GLUCOSE 181*   < > 212* 223*  --  164* 181* 257* 268* 278*  BUN 20   < > 11 12  --  16 18 12 14 17   CREATININE 1.26*   < > 1.08 1.09  --  1.05 1.00 0.97 1.04 1.04  CALCIUM 8.2*   < > 8.2* 8.6*  --  8.4*  --  8.2* 8.4* 8.2*  MG 2.1  --  1.8 2.4  --  2.0  --   --   --   --    < > = values in this interval not displayed.    Liver Function  Tests: Recent Labs  Lab 12/20/20 0749  AST 311*  ALT 834*  ALKPHOS 36*  BILITOT 2.9*  PROT 5.6*  ALBUMIN 3.2*   No results for input(s): LIPASE, AMYLASE in the last 168 hours. No results for input(s): AMMONIA in the last 168 hours.  CBC: Recent Labs  Lab 12/23/20 0408 12/23/20 0435 12/24/20 0100 12/24/20 1536 12/24/20 1744 12/25/20 0448 12/26/20 0445  WBC 11.0*  --  12.9* 15.5*  --  12.6* 11.1*  HGB 11.1*   < > 11.0* 11.2* 13.6 10.3* 9.7*  HCT 31.4*   < > 31.4* 32.4* 40.0 30.2* 27.9*  MCV 82.0  --  81.8 82.2  --  82.5 82.1  PLT 74*  --  74* 94*  --  120* 127*   < > = values in this interval not displayed.    Cardiac Enzymes: No results for input(s): CKTOTAL, CKMB, CKMBINDEX, TROPONINI in the last 168 hours.   BNP: BNP (last 3 results) Recent Labs    12/15/20 1716 12/17/20 0457  BNP 2,024.7* 1,742.2*    ProBNP (last 3 results) No results for input(s): PROBNP in the last 8760 hours.    Other results:  Imaging: No results found.   Medications:      Scheduled Medications:  (feeding supplement) PROSource Plus  30 mL Oral BID BM   aspirin EC  81 mg Oral Daily   Chlorhexidine Gluconate Cloth  6 each Topical Daily   digoxin  0.125 mg Oral Daily   docusate sodium  100 mg Oral BID   feeding supplement  1 Container Oral TID BM   insulin aspart  0-5 Units Subcutaneous QHS   insulin aspart  0-9 Units Subcutaneous TID WC   insulin glargine-yfgn  7 Units Subcutaneous QHS   mouth rinse  15 mL Mouth Rinse BID   pantoprazole  40 mg Oral Daily   polyethylene glycol  17 g Oral Daily   potassium chloride  40 mEq Oral Once   potassium chloride  40 mEq Oral Once   tamsulosin  0.4 mg Oral QPC supper   torsemide  60 mg Oral Daily    Infusions:  sodium chloride Stopped (12/23/20 0405)   amiodarone 30 mg/hr (12/26/20 0700)   heparin 50 units/mL (Impella PURGE) in dextrose 5 % 1000 mL bag 10.9 mL/hr at 12/21/20 1100   heparin     milrinone 0.5 mcg/kg/min (12/26/20 0700)   norepinephrine (LEVOPHED) Adult infusion 25 mcg/min (12/26/20 0700)    PRN Medications: sodium chloride, acetaminophen, bisacodyl, lip balm, ondansetron **OR** ondansetron (ZOFRAN) IV, oxyCODONE, sodium chloride flush, traMADol   Assessment/Plan:   1. 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 Will need cath +/- cMRI when more stable - Impella 5.5 placed 10/11.  Speed increased to P9. Would continue current speed.  Systemic heparin held for hematoma evacuation 10/14, restarted last night.  Clear urine with stable LDH at 289.  - Remains on milrinone 0.5 mcg + norepi 25 mcg. CO-OX 56%. Continue current support.  - CVP 11-12.  Good diuresis with po torsemide + tolvaptan yesterday.  Na up to 127, will not give tolvaptan.  Will increase torsemide to 60 mg  daily today and replace K.   Careful about lowering CVP too aggressively with RV failure.  -  Continue digoxin 0.125, level ok - Given social situation advanced therapies would be challenging but if he does not recover with medical therapy, he has extensive support from [facility]. We will work to get him Medicaid to keep transplant option on the table for him. Not VAD candidate with severe RV dysfunction - HCV quant is negative  - Blood type B+ - Dr Gala Romney spoke to Dr. Edwena Blow at [facility]. They would consider him for transplant evaluation (will need Medicaid first).    2. Acute PE  - CTA + bilateral segmental/subsegmental PE likely due to low output - Heparin restarted last night (post-hematoma evacuation).  - PLTs 79>74k>94>120>127.   - HIT panel negative.  - SRA negative   3. Acute blood loss anemia - bleeding from Impella site on 10/11 => heparin stopped 10/14 and patient had hematoma evacuation by Dr. Dorris Fetch => heparin restarted.    - On heparin gtt for PE + Impella  - hgb mildly lower at 9.7  4. Shock liver - improving with hemodynamic support   5. AKI on CKD 3B - baseline SCr 1.8-2.0 - due to ATN from shock  - Creatinine normalized.   6. Thrombocytopenia - Platelets starting to increase, up to 127 today.  - HIT panel negative.  - SRA negative    - Doubt significant hemolysis at this point (stable LDH and rising plts), no evidence for HIT.    7. HCV - viral RNA Quant negative   8. 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 Hyponatremia - 10/12, 10/14 Tolvaptan  - Na 127, for now will fluid restrict.  May need tolvaptan again tomorrow.   10. NSVT - 3 runs last day, short.   - Continue amio gtt 30/hr while on dual inotropes - keep K > 4.0 and Mg >2.0   11. [facility] needs - Dr Gala Romney spoke with his caseworker April Anders [phone]). He was incarcerated almost a decade ago but not since. He has been homeless for years but recently moved into a hotel in  June through the [facility]. 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 No substance use. Always first to volunteer to help other [employer] members move and do other tasks. Has a sister out of state but doesn't her to be contacted until he is improving. [employer] members are very attached to him and said they would drive him back and forth to [facility] for transplant eval,if needed. Other [employer] contact: Brother John: [phone]   Await Medicaid. Once he has insurance consider transfer to [facility] for transplant eval.   CRITICAL CARE Performed by: Marca Ancona  Total critical care time: 40 minutes  Critical care time was exclusive of separately billable procedures and treating other patients.  Critical care was necessary to treat or prevent imminent or life-threatening deterioration.  Critical care was time spent personally by me (independent of midlevel providers or residents) on the following activities: development of treatment plan with patient and/or surrogate as well as nursing, discussions with consultants, evaluation of patient's response to treatment, examination of patient, obtaining history from patient or surrogate, ordering and performing treatments and interventions, ordering and review of laboratory studies, ordering and review of radiographic studies, pulse oximetry and re-evaluation of patient's condition.  Marca Ancona, MD  7:33 AM

## 2020-12-27 ENCOUNTER — Telehealth (HOSPITAL_COMMUNITY): Payer: Self-pay | Admitting: Cardiology

## 2020-12-27 ENCOUNTER — Encounter (HOSPITAL_COMMUNITY): Payer: Self-pay | Admitting: Thoracic Surgery (Cardiothoracic Vascular Surgery)

## 2020-12-27 LAB — BASIC METABOLIC PANEL
Anion gap: 8 (ref 5–15)
BUN: 20 mg/dL (ref 6–20)
CO2: 27 mmol/L (ref 22–32)
Calcium: 8.2 mg/dL — ABNORMAL LOW (ref 8.9–10.3)
Chloride: 92 mmol/L — ABNORMAL LOW (ref 98–111)
Creatinine, Ser: 1.11 mg/dL (ref 0.61–1.24)
GFR, Estimated: >60 mL/min (ref >60)
Glucose, Bld: 189 mg/dL — ABNORMAL HIGH (ref 70–99)
Potassium: 3.6 mmol/L (ref 3.5–5.1)
Sodium: 127 mmol/L — ABNORMAL LOW (ref 135–145)

## 2020-12-27 LAB — MAGNESIUM: Magnesium: 1.9 mg/dL (ref 1.7–2.4)

## 2020-12-27 LAB — COOXEMETRY PANEL
Carboxyhemoglobin: 1.8 % — ABNORMAL HIGH (ref 0.5–1.5)
Carboxyhemoglobin: 1.3 % (ref 0.5–1.5)
Carboxyhemoglobin: 1.4 % (ref 0.5–1.5)
Carboxyhemoglobin: 1.3 % (ref 0.5–1.5)
Methemoglobin: 0.9 % (ref 0.0–1.5)
Methemoglobin: 1 % (ref 0.0–1.5)
Methemoglobin: 1 % (ref 0.0–1.5)
Methemoglobin: 0.9 % (ref 0.0–1.5)
O2 Saturation: 50.3 %
O2 Saturation: 48.4 %
O2 Saturation: 49.3 %
O2 Saturation: 49.8 %
Total hemoglobin: 9.8 g/dL — ABNORMAL LOW (ref 12.0–16.0)
Total hemoglobin: 10.1 g/dL — ABNORMAL LOW (ref 12.0–16.0)
Total hemoglobin: 10.1 g/dL — ABNORMAL LOW (ref 12.0–16.0)
Total hemoglobin: 10.5 g/dL — ABNORMAL LOW (ref 12.0–16.0)

## 2020-12-27 LAB — LACTATE DEHYDROGENASE: LDH: 312 U/L — ABNORMAL HIGH (ref 98–192)

## 2020-12-27 LAB — CBC
HCT: 27.8 % — ABNORMAL LOW (ref 39.0–52.0)
Hemoglobin: 9.5 g/dL — ABNORMAL LOW (ref 13.0–17.0)
MCH: 28.1 pg (ref 26.0–34.0)
MCHC: 34.2 g/dL (ref 30.0–36.0)
MCV: 82.2 fL (ref 80.0–100.0)
Platelets: 129 10*3/uL — ABNORMAL LOW (ref 150–400)
RBC: 3.38 MIL/uL — ABNORMAL LOW (ref 4.22–5.81)
RDW: 13.4 % (ref 11.5–15.5)
WBC: 11.3 10*3/uL — ABNORMAL HIGH (ref 4.0–10.5)
nRBC: 0 % (ref 0.0–0.2)

## 2020-12-27 LAB — GLUCOSE, CAPILLARY
Glucose-Capillary: 248 mg/dL — ABNORMAL HIGH (ref 70–99)
Glucose-Capillary: 233 mg/dL — ABNORMAL HIGH (ref 70–99)
Glucose-Capillary: 167 mg/dL — ABNORMAL HIGH (ref 70–99)
Glucose-Capillary: 215 mg/dL — ABNORMAL HIGH (ref 70–99)

## 2020-12-27 LAB — HEPARIN LEVEL (UNFRACTIONATED): Heparin Unfractionated: <0.1 IU/mL — ABNORMAL LOW (ref 0.30–0.70)

## 2020-12-27 MED ORDER — MAGNESIUM SULFATE 2 GM/50ML IV SOLN
2.0000 g | Freq: Once | INTRAVENOUS | Status: AC
  Administered 2020-12-27: 2 g via INTRAVENOUS
  Filled 2020-12-27: qty 50

## 2020-12-27 MED ORDER — INSULIN GLARGINE-YFGN 100 UNIT/ML ~~LOC~~ SOLN
25.0000 [IU] | Freq: Every day | SUBCUTANEOUS | Status: DC
  Filled 2020-12-27: qty 0.25

## 2020-12-27 MED ORDER — POTASSIUM CHLORIDE CRYS ER 20 MEQ PO TBCR
40.0000 meq | EXTENDED_RELEASE_TABLET | Freq: Once | ORAL | Status: AC
  Administered 2020-12-27: 40 meq via ORAL
  Filled 2020-12-27: qty 2

## 2020-12-27 MED ORDER — EPINEPHRINE HCL 5 MG/250ML IV SOLN IN NS
5.0000 ug/min | INTRAVENOUS | Status: DC
  Administered 2020-12-27: 1 ug/min via INTRAVENOUS
  Administered 2020-12-29: 2 ug/min via INTRAVENOUS
  Administered 2020-12-30: 3 ug/min via INTRAVENOUS
  Administered 2020-12-31 – 2021-01-05 (×7): 5 ug/min via INTRAVENOUS
  Filled 2020-12-27 (×12): qty 250

## 2020-12-27 NOTE — Progress Notes (Signed)
PT Cancellation Note  Patient Details Name: Mylon Mabey MRN: [id number] DOB: 10-07-1970   Cancelled Treatment:    Reason Eval/Treat Not Completed: Other (comment). Pt reports he has not felt good all day.   Angelina Ok Maycok 12/27/2020, 4:12 PM Skip Mayer PT Acute Rehabilitation Services Pager [phone] Office [phone]

## 2020-12-27 NOTE — TOC Progression Note (Addendum)
Transition of Care [facility]) - Progression Note    Patient Details  Name: Jaxxson Cavanah MRN: [id number] Date of Birth: [birth year]  [facility]) CM/SW Contact  Jaskirat Zertuche, LCSWA Phone Number: 12/27/2020, 10:33 AM  Clinical Narrative:    HF CSW reached out to [employer] and J. McKeel with [employer] to see if Mr. Deeg case has been assigned at DSS for Medicaid. CSW awaiting response from [place] or McKeel regarding the patients Medicaid.  CSW will continue to follow throughout discharge.   Expected Discharge Plan: Home w Home Health Services Barriers to Discharge: Continued Medical Work up  Expected Discharge Plan and Services Expected Discharge Plan: Home w Home Health Services In-house Referral: Clinical Social Work Discharge Planning Services: CM Consult Post Acute Care Choice: Home Health Living arrangements for the past 2 months: Hotel/Motel                                       Social Determinants of Health (SDOH) Interventions Financial Strain Interventions: Artist Housing Interventions: Other (Comment) (Pt. reports living at a [facility] [address] [place]) Transportation Interventions: [employer] Services  Readmission Risk Interventions No flowsheet data found.  Sopheap Boehle, MSW, LCSWA [phone] Heart Failure Social Worker

## 2020-12-27 NOTE — Telephone Encounter (Signed)
Referral for [facility] transplant eval sent to referral coordinator [facility] via email ([email or web address]) and fax # [phone]

## 2020-12-27 NOTE — Progress Notes (Addendum)
Patient ID: Dylan Chaney, male   DOB: 02-12-1971, 50 y.o.   MRN: [id number]    Advanced Heart Failure Rounding Note   Subjective:    - 10/11 Impella 5.5 placed - 12/22/20 Impllea turned up to P9 and milrinone increased to 0.5 mcg. Given dose of tolvaptan.  - 10/14 To OR for Impella site hematoma washout.   Yesterday, redeveloped hematoma at impella site. Heparin held x 6 hr. Hgb 10.1>>9.5. Back on heparin gtt.   Impella P-9. Flow 5.2. Remains on Milrinone 0.5 + NE 26. Co-ox down from yesterday, 56>>50%.   Brisk diuresis yesterday, -7L in UOP. SCr stable. K 3.6, Mg 1.9. Na 127. CVP 16 (sitting up in chair). RN got 9-12 overnight.   Continues w/ runs of NSVT. On amio gtt at 30/hr.   LDH 226>290>>298>>304>>289>>312. Urine clear.   Plts up to 129.  Of note, HIT Ab and SRA negative.   Feels ok. Mild pain at hematoma site. Continues w/ orthopnea at night. No dyspnea sitting up.    Objective:   Weight Range:  Vital Signs:   Temp:  [98.1 F (36.7 C)-99 F (37.2 C)] 98.9 F (37.2 C) (10/17 0645) Pulse Rate:  [27-156] 105 (10/17 0700) Resp:  [11-26] 22 (10/17 0700) BP: (64-142)/(48-117) 89/64 (10/17 0700) SpO2:  [92 %-100 %] 93 % (10/17 0700) Weight:  [91.7 kg] 91.7 kg (10/17 0600) Last BM Date: 12/26/20  Weight change: Filed Weights   12/24/20 0600 12/25/20 0700 12/27/20 0600  Weight: 87.5 kg 90.7 kg 91.7 kg    Intake/Output:   Intake/Output Summary (Last 24 hours) at 12/27/2020 8938 Last data filed at 12/27/2020 0700 Gross per 24 hour  Intake 3846.82 ml  Output 7125 ml  Net -3278.18 ml   PHYSICAL EXAM: General:  Well appearing sitting up in chair. No respiratory difficulty HEENT: normal Neck: supple. JVD 12 cm. Carotids 2+ bilat; no bruits. No lymphadenopathy or thyromegaly appreciated. Cor: PMI nondisplaced. Regular rate & rhythm. No rubs, gallops or murmurs. Lungs: clear Abdomen: soft, nontender, nondistended. No hepatosplenomegaly. No bruits or masses. Good  bowel sounds. Extremities: no cyanosis, clubbing, rash, 2+ bilateral LE edema, TED hoses, + Rt axillary impella w/ hematoma, pressure dressing applied  Neuro: alert & oriented x 3, cranial nerves grossly intact. moves all 4 extremities w/o difficulty. Affect pleasant.   Telemetry: sinus tach low 100s,  runs of NSVT (personally reviewed)   Labs: Basic Metabolic Panel: Recent Labs  Lab 12/22/20 1322 12/23/20 0408 12/23/20 0435 12/24/20 0100 12/24/20 1744 12/25/20 0448 12/25/20 1400 12/26/20 0445 12/27/20 0341  NA 127* 132*   < > 132* 132* 126* 128* 127* 127*  K 5.2* 4.3   < > 4.0 4.4 3.5 4.0 3.8 3.6  CL 94* 95*  --  95* 89* 90* 91* 91* 92*  CO2 25 28  --  28  --  28 27 24 27   GLUCOSE 212* 223*  --  164* 181* 257* 268* 278* 189*  BUN 11 12  --  16 18 12 14 17 20   CREATININE 1.08 1.09  --  1.05 1.00 0.97 1.04 1.04 1.11  CALCIUM 8.2* 8.6*  --  8.4*  --  8.2* 8.4* 8.2* 8.2*  MG 1.8 2.4  --  2.0  --   --   --   --  1.9   < > = values in this interval not displayed.    Liver Function Tests: Recent Labs  Lab 12/20/20 0749  AST 311*  ALT 834*  ALKPHOS  36*  BILITOT 2.9*  PROT 5.6*  ALBUMIN 3.2*   No results for input(s): LIPASE, AMYLASE in the last 168 hours. No results for input(s): AMMONIA in the last 168 hours.  CBC: Recent Labs  Lab 12/24/20 1536 12/24/20 1744 12/25/20 0448 12/26/20 0445 12/26/20 1914 12/27/20 0341  WBC 15.5*  --  12.6* 11.1* 11.6* 11.3*  HGB 11.2* 13.6 10.3* 9.7* 10.1* 9.5*  HCT 32.4* 40.0 30.2* 27.9* 29.5* 27.8*  MCV 82.2  --  82.5 82.1 81.9 82.2  PLT 94*  --  120* 127* 143* 129*    Cardiac Enzymes: No results for input(s): CKTOTAL, CKMB, CKMBINDEX, TROPONINI in the last 168 hours.   BNP: BNP (last 3 results) Recent Labs    12/15/20 1716 12/17/20 0457  BNP 2,024.7* 1,742.2*    ProBNP (last 3 results) No results for input(s): PROBNP in the last 8760 hours.    Other results:  Imaging: No results found.   Medications:      Scheduled Medications:  (feeding supplement) PROSource Plus  30 mL Oral BID BM   aspirin EC  81 mg Oral Daily   Chlorhexidine Gluconate Cloth  6 each Topical Daily   digoxin  0.125 mg Oral Daily   docusate sodium  100 mg Oral BID   feeding supplement  1 Container Oral TID BM   insulin aspart  0-5 Units Subcutaneous QHS   insulin aspart  0-9 Units Subcutaneous TID WC   insulin glargine-yfgn  15 Units Subcutaneous Daily   mouth rinse  15 mL Mouth Rinse BID   pantoprazole  40 mg Oral Daily   polyethylene glycol  17 g Oral Daily   tamsulosin  0.4 mg Oral QPC supper   torsemide  60 mg Oral Daily    Infusions:  sodium chloride Stopped (12/23/20 0405)   amiodarone 30 mg/hr (12/27/20 0700)   heparin 50 units/mL (Impella PURGE) in dextrose 5 % 1000 mL bag 10.9 mL/hr at 12/21/20 1100   heparin 600 Units/hr (12/27/20 0700)   milrinone 0.5 mcg/kg/min (12/27/20 0700)   norepinephrine (LEVOPHED) Adult infusion 26 mcg/min (12/27/20 0700)    PRN Medications: sodium chloride, acetaminophen, bisacodyl, lip balm, ondansetron **OR** ondansetron (ZOFRAN) IV, oxyCODONE, sodium chloride flush, traMADol   Assessment/Plan:   1. 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 Will need cath +/- cMRI when more stable - Impella 5.5 placed 10/11.  Speed increased to P9. Would continue current speed. Clear urine.  LDH up 289>>312.  - Remains on milrinone 0.5 mcg + norepi 26 mcg. CO-OX 50%. Repeat co-ox. Continue current support.  - CVP ~12.  Good diuresis with po torsemide.  Na up to 127, will not give tolvaptan.  Continue torsemide 60 mg daily.  Careful about lowering CVP too aggressively with RV failure.  - Continue digoxin 0.125, level ok - Given social situation advanced therapies  would be challenging but if he does not recover with medical therapy, he has extensive support from [facility]. We will work to get him Medicaid to keep transplant option on the table for him. Not VAD candidate with severe RV dysfunction - HCV quant is negative  - Blood type B+ - Dr Gala Romney spoke to Dr. Edwena Blow at [facility]. They would consider him for transplant evaluation (will need  Medicaid first).    2. Acute PE  - CTA + bilateral segmental/subsegmental PE likely due to low output - Heparin restarted last night (post-hematoma evacuation).  - PLTs 79>74k>94>120>127>129.   - HIT panel negative.  - SRA negative   3. Acute blood loss anemia - bleeding from Impella site on 10/11 => heparin stopped 10/14 and patient had hematoma evacuation by Dr. Dorris Fetch => heparin restarted.    - On heparin gtt for PE + Impella  - hgb mildly lower at 9.5  4. Shock liver - improving with hemodynamic support   5. AKI on CKD 3B - baseline SCr 1.8-2.0 - due to ATN from shock  - Creatinine normalized.   6. Thrombocytopenia - Platelets starting to increase, up to 129 today.  - HIT panel negative.  - SRA negative    - Doubt significant hemolysis at this point (stable LDH and rising plts), no evidence for HIT.    7. HCV - viral RNA Quant negative   8. 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 Hyponatremia - 10/12, 10/14 Tolvaptan  - Na 127, for now will fluid restrict.    10. NSVT - Increase amio gtt 60/hr  - continue amio gtt while on dual inotropes - keep K > 4.0 and Mg >2.0   11. [facility] needs - Dr Gala Romney spoke with his caseworker April Anders [phone]). He was incarcerated almost a decade ago but not since. He has been homeless for years but recently moved into a hotel in June through the [facility]. He has been a model participant in their program. 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 No substance use. Always first to volunteer to help other [employer] members move and do other tasks. [employer] members are very attached to him and said they would drive him back and forth to [facility] for transplant eval,if needed. Other [employer] contact: Brother John: [phone]. His sister and his mother have been in to see and support him.   Await Medicaid. Once he has insurance consider transfer to [facility] for transplant eval.    Robbie Lis, PA-C  7:12 AM  Agree with above.   Not feeling that well. Having recurrent low flow abdominal symptoms.   Remains on Impella 5.5 at P-9 with 5.2L of flow. Continues to require milrinone at 0.5 and NE dose up to 26. Co-ox marginal at 49%. Renal function stable. Hematoma at Impella site stable.    General:  Sitting in chair  No resp difficulty HEENT: normal Neck: supple. JVP 14 Carotids 2+ bilat; no bruits. No lymphadenopathy or thryomegaly appreciated. Cor: Impella site with small hematoma PMI nondisplaced. Regular tachy No rubs, gallops or murmurs. Lungs: clear Abdomen: soft, nontender, nondistended. No hepatosplenomegaly. No bruits or masses. Good bowel sounds. Extremities: no cyanosis, clubbing, rash, trace edema  RUE mildly swollen Neuro: alert & orientedx3, cranial nerves grossly intact. moves all 4 extremities w/o difficulty. Affect pleasant  Remains very tenuous despite Impella 5.5 support. Continues to require high dose pressor support. Will hold diuretics today to let RV catch up. I am worried his RV is running out of time. Will add low dose epi at 1 to support. May need Protek Duo for RV support. I have d/w [facility] team   Calculated Fick: 4.5/2.1  SVR  1127  Run heparin on low end of therapeutic.  PLTs rebounding. Continue to ambulate.   Bedside echo done personally Impella position ok. 4.2 cm to AoV (VAD interrogated personally. Parameters stable.)  Will check with SW on status of Medicaid  CRITICAL CARE Performed by:  Arvilla Meres  Total critical care time: 45 minutes  Critical care time was exclusive of separately billable procedures and treating other patients.  Critical care was necessary to treat or prevent imminent or life-threatening deterioration.  Critical care was time spent personally by me (independent of midlevel providers or residents) on the following activities: development of treatment plan with patient and/or surrogate as well as nursing, discussions with consultants, evaluation of patient's response to treatment, examination of patient, obtaining history from patient or surrogate, ordering and performing treatments and interventions, ordering and review of laboratory studies, ordering and review of radiographic studies, pulse oximetry and re-evaluation of patient's condition.  Arvilla Meres, MD  8:58 AM

## 2020-12-27 NOTE — Progress Notes (Signed)
Inpatient Diabetes Program Recommendations  AACE/ADA: New Consensus Statement on Inpatient Glycemic Control (2015)  Target Ranges:  Prepandial:   less than 140 mg/dL      Peak postprandial:   less than 180 mg/dL (1-2 hours)      Critically ill patients:  140 - 180 mg/dL   Lab Results  Component Value Date   GLUCAP 233 (H) 12/27/2020   HGBA1C 6.5 (H) 12/23/2020    Review of Glycemic Control Results for SHARBEL, SAHAGUN (MRN [id number]) as of 12/27/2020 11:54  Ref. Range 12/26/2020 12:24 12/26/2020 17:19 12/26/2020 21:37 12/27/2020 06:45 12/27/2020 11:09  Glucose-Capillary Latest Ref Range: 70 - 99 mg/dL 638 (H) 756 (H) 433 (H) 248 (H) 233 (H)   Diabetes history: None noted Outpatient Diabetes medications: None Current orders for Inpatient glycemic control:  Novolog sensitive tid with meals and HS Semglee 15 units daily  Inpatient Diabetes Program Recommendations:    Please increase Semglee to 30 units daily.  Note that A1C indicates "Diabetes" .  ? May need the addition of oral agents and DM education prior to D/C.  MD please advise.    Thanks,  Beryl Meager, RN, BC-ADM Inpatient Diabetes Coordinator Pager [phone]  (8a-5p)

## 2020-12-27 NOTE — Progress Notes (Signed)
3 Days Post-Op Procedure(s) (LRB): EVACUATION HEMATOMA (N/A) Subjective: No complaints this AM Impella p9 with 5.2L/min flow  Objective: Vital signs in last 24 hours: Temp:  [98.1 F (36.7 C)-99 F (37.2 C)] 98.9 F (37.2 C) (10/17 0645) Pulse Rate:  [27-156] 93 (10/17 0730) Cardiac Rhythm: Sinus tachycardia (10/16 2000) Resp:  [11-26] 23 (10/17 0730) BP: (64-142)/(48-117) 79/60 (10/17 0730) SpO2:  [92 %-100 %] 98 % (10/17 0730) Weight:  [91.7 kg] 91.7 kg (10/17 0600)  Hemodynamic parameters for last 24 hours: CVP:  [8 mmHg-18 mmHg] 10 mmHg  Intake/Output from previous day: 10/16 0701 - 10/17 0700 In: 3869.7 [P.O.:2120; I.V.:1426.6; IV Piggyback:43.7] Out: 7125 [Urine:7125] Intake/Output this shift: No intake/output data recorded.  General appearance: alert, cooperative, and no distress Neurologic: intact Wound: some edema around incision, incision is clean and dry  Lab Results: Recent Labs    12/26/20 1914 12/27/20 0341  WBC 11.6* 11.3*  HGB 10.1* 9.5*  HCT 29.5* 27.8*  PLT 143* 129*   BMET:  Recent Labs    12/26/20 0445 12/27/20 0341  NA 127* 127*  K 3.8 3.6  CL 91* 92*  CO2 24 27  GLUCOSE 278* 189*  BUN 17 20  CREATININE 1.04 1.11  CALCIUM 8.2* 8.2*    PT/INR: No results for input(s): LABPROT, INR in the last 72 hours. ABG    Component Value Date/Time   PHART 7.470 (H) 12/23/2020 0435   HCO3 28.7 (H) 12/23/2020 0435   TCO2 30 12/24/2020 1744   ACIDBASEDEF 2.0 12/22/2020 0415   O2SAT 48.4 12/27/2020 0743   CBG (last 3)  Recent Labs    12/26/20 1719 12/26/20 2137 12/27/20 0645  GLUCAP 376* 157* 248*    Assessment/Plan: S/P Procedure(s) (LRB): EVACUATION HEMATOMA (N/A) Wound Ok Impella with 5.2 L flow on p9 Co-ox down this AM at 50 Diuresed 7L yesterday   LOS: 11 days    Loreli Slot 12/27/2020

## 2020-12-27 NOTE — Progress Notes (Signed)
CSW received return call from [place] from [employer] stating that she anticipates a caseworker to be assigned from [facility] tomorrow. She will notify CSW with name and contact information as soon as she receives the information. CSW updated patient and sister at bedside of medicaid status. CSW continues to follow and collaborate with Cortlin Polo, LCSW-A. Lasandra Beech, LCSW, CCSW-MCS [phone]

## 2020-12-28 ENCOUNTER — Inpatient Hospital Stay (HOSPITAL_COMMUNITY): Payer: Medicaid Other

## 2020-12-28 DIAGNOSIS — I5023 Acute on chronic systolic (congestive) heart failure: Secondary | ICD-10-CM

## 2020-12-28 LAB — CBC
HCT: 29.1 % — ABNORMAL LOW (ref 39.0–52.0)
Hemoglobin: 10 g/dL — ABNORMAL LOW (ref 13.0–17.0)
MCH: 27.9 pg (ref 26.0–34.0)
MCHC: 34.4 g/dL (ref 30.0–36.0)
MCV: 81.1 fL (ref 80.0–100.0)
Platelets: 142 10*3/uL — ABNORMAL LOW (ref 150–400)
RBC: 3.59 MIL/uL — ABNORMAL LOW (ref 4.22–5.81)
RDW: 13.6 % (ref 11.5–15.5)
WBC: 10.5 10*3/uL (ref 4.0–10.5)
nRBC: 0 % (ref 0.0–0.2)

## 2020-12-28 LAB — BPAM RBC
Blood Product Expiration Date: 202210262359
Blood Product Expiration Date: 202210272359
Blood Product Expiration Date: 202211042359
Blood Product Expiration Date: 202211042359
ISSUE DATE / TIME: 202210141650
ISSUE DATE / TIME: 202210141650
Unit Type and Rh: 7300
Unit Type and Rh: 7300
Unit Type and Rh: 7300
Unit Type and Rh: 7300

## 2020-12-28 LAB — COOXEMETRY PANEL
Carboxyhemoglobin: 1.4 % (ref 0.5–1.5)
Carboxyhemoglobin: 1.7 % — ABNORMAL HIGH (ref 0.5–1.5)
Carboxyhemoglobin: 1.3 % (ref 0.5–1.5)
Methemoglobin: 0.8 % (ref 0.0–1.5)
Methemoglobin: 1 % (ref 0.0–1.5)
Methemoglobin: 0.9 % (ref 0.0–1.5)
O2 Saturation: 50.6 %
O2 Saturation: 58.7 %
O2 Saturation: 50.2 %
Total hemoglobin: 9.9 g/dL — ABNORMAL LOW (ref 12.0–16.0)
Total hemoglobin: 10.6 g/dL — ABNORMAL LOW (ref 12.0–16.0)
Total hemoglobin: 10.4 g/dL — ABNORMAL LOW (ref 12.0–16.0)

## 2020-12-28 LAB — TYPE AND SCREEN
ABO/RH(D): B POS
Antibody Screen: NEGATIVE
Unit division: 0
Unit division: 0
Unit division: 0
Unit division: 0

## 2020-12-28 LAB — GLUCOSE, CAPILLARY
Glucose-Capillary: 188 mg/dL — ABNORMAL HIGH (ref 70–99)
Glucose-Capillary: 254 mg/dL — ABNORMAL HIGH (ref 70–99)
Glucose-Capillary: 215 mg/dL — ABNORMAL HIGH (ref 70–99)
Glucose-Capillary: 256 mg/dL — ABNORMAL HIGH (ref 70–99)
Glucose-Capillary: 128 mg/dL — ABNORMAL HIGH (ref 70–99)

## 2020-12-28 LAB — BASIC METABOLIC PANEL
Anion gap: 8 (ref 5–15)
BUN: 22 mg/dL — ABNORMAL HIGH (ref 6–20)
CO2: 26 mmol/L (ref 22–32)
Calcium: 8.5 mg/dL — ABNORMAL LOW (ref 8.9–10.3)
Chloride: 93 mmol/L — ABNORMAL LOW (ref 98–111)
Creatinine, Ser: 1.1 mg/dL (ref 0.61–1.24)
GFR, Estimated: >60 mL/min (ref >60)
Glucose, Bld: 165 mg/dL — ABNORMAL HIGH (ref 70–99)
Potassium: 4.2 mmol/L (ref 3.5–5.1)
Sodium: 127 mmol/L — ABNORMAL LOW (ref 135–145)

## 2020-12-28 LAB — LACTATE DEHYDROGENASE: LDH: 323 U/L — ABNORMAL HIGH (ref 98–192)

## 2020-12-28 LAB — HEPARIN LEVEL (UNFRACTIONATED): Heparin Unfractionated: 0.15 IU/mL — ABNORMAL LOW (ref 0.30–0.70)

## 2020-12-28 MED ORDER — ADULT MULTIVITAMIN W/MINERALS CH
1.0000 | ORAL_TABLET | Freq: Every day | ORAL | Status: DC
  Administered 2020-12-28 – 2021-01-05 (×9): 1 via ORAL
  Filled 2020-12-28 (×9): qty 1

## 2020-12-28 MED ORDER — INSULIN ASPART 100 UNIT/ML IJ SOLN
0.0000 [IU] | Freq: Three times a day (TID) | INTRAMUSCULAR | Status: DC
  Administered 2020-12-28 (×2): 8 [IU] via SUBCUTANEOUS
  Administered 2020-12-29: 5 [IU] via SUBCUTANEOUS
  Administered 2020-12-29: 3 [IU] via SUBCUTANEOUS
  Administered 2020-12-29: 2 [IU] via SUBCUTANEOUS
  Administered 2020-12-30: 5 [IU] via SUBCUTANEOUS
  Administered 2020-12-30 – 2020-12-31 (×3): 3 [IU] via SUBCUTANEOUS
  Administered 2020-12-31: 5 [IU] via SUBCUTANEOUS
  Administered 2020-12-31 – 2021-01-01 (×4): 3 [IU] via SUBCUTANEOUS
  Administered 2021-01-02: 5 [IU] via SUBCUTANEOUS
  Administered 2021-01-02 – 2021-01-03 (×4): 3 [IU] via SUBCUTANEOUS
  Administered 2021-01-03: 2 [IU] via SUBCUTANEOUS
  Administered 2021-01-04: 5 [IU] via SUBCUTANEOUS
  Administered 2021-01-04 – 2021-01-05 (×3): 3 [IU] via SUBCUTANEOUS
  Administered 2021-01-05: 2 [IU] via SUBCUTANEOUS
  Administered 2021-01-05: 3 [IU] via SUBCUTANEOUS

## 2020-12-28 MED ORDER — FUROSEMIDE 10 MG/ML IJ SOLN
80.0000 mg | Freq: Three times a day (TID) | INTRAMUSCULAR | Status: AC
  Administered 2020-12-28 (×2): 80 mg via INTRAVENOUS
  Filled 2020-12-28 (×2): qty 8

## 2020-12-28 MED ORDER — POTASSIUM CHLORIDE CRYS ER 20 MEQ PO TBCR: 40.0000 meq | EXTENDED_RELEASE_TABLET | Freq: Three times a day (TID) | ORAL | Status: DC

## 2020-12-28 MED ORDER — INSULIN GLARGINE-YFGN 100 UNIT/ML ~~LOC~~ SOLN
20.0000 [IU] | Freq: Every day | SUBCUTANEOUS | Status: DC
  Administered 2020-12-28 – 2020-12-30 (×3): 20 [IU] via SUBCUTANEOUS
  Filled 2020-12-28 (×3): qty 0.2

## 2020-12-28 MED ORDER — POTASSIUM CHLORIDE CRYS ER 20 MEQ PO TBCR
40.0000 meq | EXTENDED_RELEASE_TABLET | ORAL | Status: AC
  Administered 2020-12-28 (×3): 40 meq via ORAL
  Filled 2020-12-28 (×3): qty 2

## 2020-12-28 MED ORDER — METOCLOPRAMIDE HCL 5 MG/ML IJ SOLN
10.0000 mg | Freq: Three times a day (TID) | INTRAMUSCULAR | Status: DC
  Administered 2020-12-28 – 2021-01-05 (×22): 10 mg via INTRAVENOUS
  Filled 2020-12-28 (×22): qty 2

## 2020-12-28 MED ORDER — SORBITOL 70 % SOLN
30.0000 mL | Freq: Once | Status: AC
  Administered 2020-12-28: 30 mL via ORAL
  Filled 2020-12-28: qty 30

## 2020-12-28 MED ORDER — METOLAZONE 2.5 MG PO TABS
2.5000 mg | ORAL_TABLET | Freq: Once | ORAL | Status: AC
  Administered 2020-12-28: 2.5 mg via ORAL
  Filled 2020-12-28: qty 1

## 2020-12-28 MED ORDER — PROSOURCE PLUS PO LIQD
30.0000 mL | Freq: Three times a day (TID) | ORAL | Status: DC
  Administered 2020-12-29 – 2021-01-05 (×24): 30 mL via ORAL
  Filled 2020-12-28 (×24): qty 30

## 2020-12-28 NOTE — Progress Notes (Addendum)
Patient ID: Dylan Chaney, male   DOB: 1970/05/12, 50 y.o.   MRN: [id number]    Advanced Heart Failure Rounding Note   Subjective:    - 10/11 Impella 5.5 placed - 12/22/20 Impllea turned up to P9 and milrinone increased to 0.5 mcg. Given dose of tolvaptan.  - 10/14 To OR for Impella site hematoma washout.   Co-ox dropped to 49% yesterday despite support. EPi added. Diuretics held.   Currently on Epi 1 + NE 16 + Milrinone 0.5. Impella P-9. Flow 4.9  Co-ox slightly improved but remains marginal at 51%  CI by Fick Calculation 2.03L/min2   Wt is up 6 lb. CVP 17. 2.7L in UOP yesterday.  Less ectopy w/ amio increase, now at 60/hr   Scr remains stable 1.10  K 4.2 Na 127   Hematoma at Impella site continues to ooze.  Hgb stable 10. Plts trending up, 129>>142.  LDH 304>>289>>312>>323  OOB sitting up in chair. Feels bad. Complains of abdominal discomfort and nausea. Doesn't feel like eating. Denies resting dyspnea.   Objective:   Weight Range:  Vital Signs:   Temp:  [97.8 F (36.6 C)-98.9 F (37.2 C)] 97.8 F (36.6 C) (10/18 0600) Pulse Rate:  [74-113] 94 (10/18 0600) Resp:  [13-26] 20 (10/18 0600) BP: (51-122)/(16-92) 101/90 (10/18 0600) SpO2:  [86 %-100 %] 96 % (10/18 0600) Weight:  [94.5 kg] 94.5 kg (10/18 0700) Last BM Date: 12/26/20  Weight change: Filed Weights   12/25/20 0700 12/27/20 0600 12/28/20 0700  Weight: 90.7 kg 91.7 kg 94.5 kg    Intake/Output:   Intake/Output Summary (Last 24 hours) at 12/28/2020 0714 Last data filed at 12/28/2020 0700 Gross per 24 hour  Intake 2679.99 ml  Output 2650 ml  Net 29.99 ml   PHYSICAL EXAM: CVP 17  General:  fatigue appearing. No respiratory difficulty HEENT: normal Neck: supple. JVD elevated to jaw. Left IJ CVC Carotids 2+ bilat; no bruits. No lymphadenopathy or thyromegaly appreciated. Cor: PMI nondisplaced. Regular rate & rhythm. No rubs, gallops or murmurs. Lungs: decreased BS at the bases  Abdomen: mild  diffuse tenderness, ++ distended. No hepatosplenomegaly. No bruits or masses. Good bowel sounds. Extremities: no cyanosis, clubbing, rash, 2+ bilateral LEE edema, + Rt axillary impella w/ hematoma, continues to ooze  Neuro: alert & oriented x 3, cranial nerves grossly intact. moves all 4 extremities w/o difficulty. Affect pleasant.   Telemetry: NSR 90s, 2 brief runs of NSVT (4-5 beats) (personally reviewed)   Labs: Basic Metabolic Panel: Recent Labs  Lab 12/22/20 1322 12/23/20 0408 12/23/20 0435 12/24/20 0100 12/24/20 1744 12/25/20 0448 12/25/20 1400 12/26/20 0445 12/27/20 0341 12/28/20 0357  NA 127* 132*   < > 132*   < > 126* 128* 127* 127* 127*  K 5.2* 4.3   < > 4.0   < > 3.5 4.0 3.8 3.6 4.2  CL 94* 95*  --  95*   < > 90* 91* 91* 92* 93*  CO2 25 28  --  28  --  28 27 24 27 26   GLUCOSE 212* 223*  --  164*   < > 257* 268* 278* 189* 165*  BUN 11 12  --  16   < > 12 14 17 20  22*  CREATININE 1.08 1.09  --  1.05   < > 0.97 1.04 1.04 1.11 1.10  CALCIUM 8.2* 8.6*  --  8.4*  --  8.2* 8.4* 8.2* 8.2* 8.5*  MG 1.8 2.4  --  2.0  --   --   --   --  1.9  --    < > = values in this interval not displayed.    Liver Function Tests: No results for input(s): AST, ALT, ALKPHOS, BILITOT, PROT, ALBUMIN in the last 168 hours.  No results for input(s): LIPASE, AMYLASE in the last 168 hours. No results for input(s): AMMONIA in the last 168 hours.  CBC: Recent Labs  Lab 12/25/20 0448 12/26/20 0445 12/26/20 1914 12/27/20 0341 12/28/20 0357  WBC 12.6* 11.1* 11.6* 11.3* 10.5  HGB 10.3* 9.7* 10.1* 9.5* 10.0*  HCT 30.2* 27.9* 29.5* 27.8* 29.1*  MCV 82.5 82.1 81.9 82.2 81.1  PLT 120* 127* 143* 129* 142*    Cardiac Enzymes: No results for input(s): CKTOTAL, CKMB, CKMBINDEX, TROPONINI in the last 168 hours.   BNP: BNP (last 3 results) Recent Labs    12/15/20 1716 12/17/20 0457  BNP 2,024.7* 1,742.2*    ProBNP (last 3 results) No results for input(s): PROBNP in the last 8760  hours.    Other results:  Imaging: No results found.   Medications:     Scheduled Medications:  (feeding supplement) PROSource Plus  30 mL Oral BID BM   aspirin EC  81 mg Oral Daily   Chlorhexidine Gluconate Cloth  6 each Topical Daily   digoxin  0.125 mg Oral Daily   docusate sodium  100 mg Oral BID   feeding supplement  1 Container Oral TID BM   insulin aspart  0-5 Units Subcutaneous QHS   insulin aspart  0-9 Units Subcutaneous TID WC   insulin glargine-yfgn  25 Units Subcutaneous Daily   pantoprazole  40 mg Oral Daily   polyethylene glycol  17 g Oral Daily   tamsulosin  0.4 mg Oral QPC supper    Infusions:  sodium chloride 10 mL/hr at 12/28/20 0700   amiodarone 60 mg/hr (12/28/20 0700)   epinephrine 1 mcg/min (12/28/20 0700)   heparin 50 units/mL (Impella PURGE) in dextrose 5 % 1000 mL bag 10.9 mL/hr at 12/21/20 1100   heparin 600 Units/hr (12/28/20 0700)   milrinone 0.5 mcg/kg/min (12/28/20 0700)   norepinephrine (LEVOPHED) Adult infusion 16 mcg/min (12/28/20 0700)    PRN Medications: sodium chloride, acetaminophen, bisacodyl, lip balm, ondansetron **OR** ondansetron (ZOFRAN) IV, oxyCODONE, sodium chloride flush, traMADol   Assessment/Plan:   1. 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 Will need cath +/- cMRI when more stable - Impella 5.5 placed 10/11.  Speed increased to P9. Would continue current speed. Clear urine.  LDH up 289>>312>>323.  - Remains on milrinone 0.5 mcg + norepi 16 mcg + Epi 1. CO-OX marginal 51%. CI 2.03 by Fick calculation   - Remains very tenuous despite current support, May need Protek Duo for RV support. - CVP 17, restart diuretics today. Careful about lowering CVP too aggressively with RV failure.  - Continue  digoxin 0.125, level ok - Given social situation advanced therapies would be challenging but if he does not recover with medical therapy, he has extensive support from [facility]. We will work to get him Medicaid to keep transplant option on the table for him. Not VAD candidate with severe RV dysfunction - HCV quant is negative  - Blood type B+ - Dr Gala Romney spoke to Dr. Edwena Blow at [facility]. They would consider him for transplant  evaluation (will need Medicaid first).    2. Acute PE  - CTA + bilateral segmental/subsegmental PE likely due to low output - Heparin restarted last night (post-hematoma evacuation).  - PLTs 79>74k>94>120>127>129>142 - HIT panel negative.  - SRA negative   3. Acute blood loss anemia - bleeding from Impella site on 10/11 => heparin stopped 10/14 and patient had hematoma evacuation by Dr. Dorris Fetch => heparin restarted.    - On heparin gtt for PE + Impella  - Continues to ooze from impella site - Hgb stable 10.0 today. Monitor   4. Shock liver - improving with hemodynamic support   5. AKI on CKD 3B - baseline SCr 1.8-2.0 - due to ATN from shock  - Creatinine normalized.   6. Thrombocytopenia - Platelets starting to increase, up to 142 today.  - HIT panel negative.  - SRA negative    - Doubt significant hemolysis at this point (stable LDH and rising plts), no evidence for HIT.    7. HCV - viral RNA Quant negative   8. 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 Hyponatremia - 10/12, 10/14 Tolvaptan  - Na 127, for now will fluid restrict.    10. NSVT - less ectopy w/ amio gtt increase, continue at 60/hr  - continue amio gtt while on dual inotropes - keep K > 4.0 and Mg >2.0   11. [facility] needs - Dr Gala Romney spoke with his caseworker April Anders [phone]). He was incarcerated almost a decade ago but not since. He has been homeless for years but recently moved into a hotel in June through the [facility]. He has been a model participant in their  program. 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 No substance use. Always first to volunteer to help other [employer] members move and do other tasks. [employer] members are very attached to him and said they would drive him back and forth to [facility] for transplant eval,if needed. Other [employer] contact: Brother John: [phone]. His sister and his mother have been in to see and support him.   Await Medicaid. Once he has insurance consider transfer to [facility] for transplant eval.    Robbie Lis, PA-C  7:14 AM  Agree with above.   Continues to struggle with low output symptoms despite Impella 5.5 @ P-9, NE, epi and milrinone. Co-ox 51% CVP up (lasix held yesterday). End organ function remains stable. PLTs up to 142K. No word on Medicaid yet. On heparin 600u systemic + 600u in purge. HL 0.15   General:  Sitting in chair. Weak appearing. No resp difficulty HEENT: normal Neck: supple. JVP to ear Carotids 2+ bilat; no bruits. No lymphadenopathy or thryomegaly appreciated. Cor: Impella site with mild ooze. PMI nondisplaced. Regular rate & rhythm. No rubs, gallops or murmurs. Lungs: clear Abdomen: soft, nontender, nondistended. No hepatosplenomegaly. No bruits or masses. Good bowel sounds. Extremities: no cyanosis, clubbing, rash, tr edema Neuro: alert & orientedx3, cranial nerves grossly intact. moves all 4 extremities w/o difficulty. Affect pleasant  Low output symptoms persist. Likely RV failure. Will increase epi this am to 2. I think he will ne BiVAD support with Protek Duo too facilitate wait for transplant. Will plan implant today/tomorrow. Diurese with IV lasix. F/u with SW on Medicaid. Records sent to [facility] on 10/17.   CRITICAL CARE Performed by: Arvilla Meres  Total critical care time: 45 minutes  Critical care time was exclusive of separately billable procedures and treating other  patients.  Critical care was necessary to treat or prevent imminent or life-threatening deterioration.  Critical care was time spent personally by me (independent of midlevel providers or residents) on the following activities: development of treatment plan with patient and/or surrogate as well as nursing, discussions with consultants, evaluation of patient's response to treatment, examination of patient, obtaining history from patient or surrogate, ordering and performing treatments and interventions, ordering and review of laboratory studies, ordering and review of radiographic studies, pulse oximetry and re-evaluation of patient's condition.  Arvilla Meres, MD  8:07 AM

## 2020-12-28 NOTE — Progress Notes (Signed)
Orthopedic Tech Progress Note Patient Details:  Dylan Chaney 1970-04-24 [id number]  Ortho Devices Type of Ortho Device: Radio broadcast assistant Ortho Device/Splint Location: BLE Ortho Device/Splint Interventions: Ordered, Application   Post Interventions Patient Tolerated: Well Instructions Provided: Care of device  Donald Pore 12/28/2020, 11:24 AM

## 2020-12-28 NOTE — Progress Notes (Signed)
   Co-ox improved with increasing epi drip to 2. Has also diuresed very well.   However still feels bad.   No appetite. Bloated. Very weak.   Impella site ok. Having mild epistaxis.   On exam abdomen is distended and mildly tender.   I worry about developing ileus/obstruction.   Check KUB. Start reglan and sorbitol.   Additional CCT 30 mins   Arvilla Meres, MD  6:43 PM

## 2020-12-28 NOTE — Progress Notes (Signed)
Occupational Therapy Treatment Patient Details Name: Dylan Chaney MRN: [id number] DOB: 09-23-1970 Today's Date: 12/28/2020   History of present illness Pt is a 50 y.o. male admitted 12/17/20 with chest pain, SOB. Chest CT with bilateral PE. Echo showed EF < 20%. Decision made for mechanical support as possible bridge to transplant. Now s/p placement of Impella left ventricular assist device 10/11. S/p impella hematoma evacuation 10/15. PMH includes remote drug and alcohol abuse.   OT comments  Despite Pt feeling poorly/nauseous - agreeable to therapy session. Pt min A +2 safety for hallway ambulation working on activity tolerance for ADL/IADL followed by chair level ADL (peri care after using urinal) and grooming tasks (hand washing, face washing) Current POC remains appropriate and OT will continue to follow acutely.    Recommendations for follow up therapy are one component of a multi-disciplinary discharge planning process, led by the attending physician.  Recommendations may be updated based on patient status, additional functional criteria and insurance authorization.    Follow Up Recommendations  No OT follow up    Equipment Recommendations  None recommended by OT    Recommendations for Other Services      Precautions / Restrictions Precautions Precautions: Fall;Other (comment) Precaution Comments: Impella, LIJ CVC Restrictions Weight Bearing Restrictions: Yes RUE Weight Bearing: Partial weight bearing RUE Partial Weight Bearing Percentage or Pounds: no ROM above head       Mobility Bed Mobility               General bed mobility comments: OOB in recliner at beginning and end of session    Transfers Overall transfer level: Needs assistance Equipment used: Bilateral platform walker (EVA walker) Transfers: Sit to/from Stand Sit to Stand: Min guard;+2 safety/equipment         General transfer comment: Reliant on momentum to power into standing, supervision for  safety/lines; +2 line/impella management    Balance Overall balance assessment: Needs assistance   Sitting balance-Leahy Scale: Good       Standing balance-Leahy Scale: Fair Standing balance comment: can static stand without UE support; static and dynamic stability improved with walker                           ADL either performed or assessed with clinical judgement   ADL Overall ADL's : Needs assistance/impaired     Grooming: Wash/dry hands;Wash/dry face;Set up;Sitting Grooming Details (indicate cue type and reason): in recliner                 Toilet Transfer: Minimal assistance;+2 for safety/equipment;Ambulation Toilet Transfer Details (indicate cue type and reason): EVA walker Toileting- Clothing Manipulation and Hygiene: Min guard;Sit to/from stand Toileting - Clothing Manipulation Details (indicate cue type and reason): front peri care     Functional mobility during ADLs: Min guard;+2 for safety/equipment (EVA walker)       Vision       Perception     Praxis      Cognition Arousal/Alertness: Awake/alert Behavior During Therapy: Flat affect Overall Cognitive Status: Within Functional Limits for tasks assessed                                 General Comments: Pt with flat affect, clearly fatigued and feeling poorly; still pleasant and agreeable to participate        Exercises     Shoulder Instructions  General Comments Pt endorses increased malaise this session (RN present during session)    Pertinent Vitals/ Pain       Pain Assessment: Faces Faces Pain Scale: Hurts even more Pain Location: Impella site, generalized Pain Descriptors / Indicators: Guarding;Tiring;Discomfort Pain Intervention(s): Monitored during session;Repositioned  Home Living                                          Prior Functioning/Environment              Frequency  Min 2X/week        Progress Toward  Goals  OT Goals(current goals can now be found in the care plan section)  Progress towards OT goals: Progressing toward goals  Acute Rehab OT Goals Patient Stated Goal: Just get back on my feet OT Goal Formulation: With patient Time For Goal Achievement: 01/06/21 Potential to Achieve Goals: Good  Plan Discharge plan remains appropriate;Frequency remains appropriate;Equipment recommendations need to be updated    Co-evaluation                 AM-PAC OT "6 Clicks" Daily Activity     Outcome Measure   Help from another person eating meals?: None Help from another person taking care of personal grooming?: None Help from another person toileting, which includes using toliet, bedpan, or urinal?: A Little Help from another person bathing (including washing, rinsing, drying)?: A Little Help from another person to put on and taking off regular upper body clothing?: A Little Help from another person to put on and taking off regular lower body clothing?: A Little 6 Click Score: 20    End of Session Equipment Utilized During Treatment:  Fara Boros)  OT Visit Diagnosis: Unsteadiness on feet (R26.81);Pain Pain - Right/Left: Right Pain - part of body: Arm   Activity Tolerance Patient tolerated treatment well   Patient Left in chair;with call bell/phone within reach;with nursing/sitter in room   Nurse Communication Mobility status        Time: 9604-5409 OT Time Calculation (min): 27 min  Charges: OT General Charges $OT Visit: 1 Visit OT Treatments $Self Care/Home Management : 8-22 mins Nyoka Cowden OTR/L Acute Rehabilitation Services Pager: [phone] Office: [phone]  Evern Bio Pegge Cumberledge 12/28/2020, 12:08 PM

## 2020-12-28 NOTE — Progress Notes (Signed)
4 Days Post-Op Procedure(s) (LRB): EVACUATION HEMATOMA (N/A) Subjective: Feels poorly this AM Abdominal discomfort, no pain, poor appetite, general malaise  Objective: Vital signs in last 24 hours: Temp:  [97.8 F (36.6 C)-98.9 F (37.2 C)] 97.8 F (36.6 C) (10/18 0600) Pulse Rate:  [74-113] 94 (10/18 0600) Resp:  [13-26] 20 (10/18 0600) BP: (63-122)/(35-92) 101/90 (10/18 0600) SpO2:  [86 %-100 %] 96 % (10/18 0600) Weight:  [94.5 kg] 94.5 kg (10/18 0700)  Hemodynamic parameters for last 24 hours: CVP:  [12 mmHg-20 mmHg] 17 mmHg  Intake/Output from previous day: 10/17 0701 - 10/18 0700 In: 2691.6 [P.O.:480; I.V.:1944.8] Out: 2650 [Urine:2650] Intake/Output this shift: No intake/output data recorded.  General appearance: alert, cooperative, and no distress Neurologic: intact Heart: regular rate and rhythm Lungs: diminished breath sounds bibasilar Abdomen: distended Wound: minimal drainage, small seroma/ hematoma  Lab Results: Recent Labs    12/27/20 0341 12/28/20 0357  WBC 11.3* 10.5  HGB 9.5* 10.0*  HCT 27.8* 29.1*  PLT 129* 142*   BMET:  Recent Labs    12/27/20 0341 12/28/20 0357  NA 127* 127*  K 3.6 4.2  CL 92* 93*  CO2 27 26  GLUCOSE 189* 165*  BUN 20 22*  CREATININE 1.11 1.10  CALCIUM 8.2* 8.5*    PT/INR: No results for input(s): LABPROT, INR in the last 72 hours. ABG    Component Value Date/Time   PHART 7.470 (H) 12/23/2020 0435   HCO3 28.7 (H) 12/23/2020 0435   TCO2 30 12/24/2020 1744   ACIDBASEDEF 2.0 12/22/2020 0415   O2SAT 50.6 12/28/2020 0357   CBG (last 3)  Recent Labs    12/27/20 1536 12/27/20 2131 12/28/20 0637  GLUCAP 167* 215* 188*    Assessment/Plan: S/P Procedure(s) (LRB): EVACUATION HEMATOMA (N/A) Does not feel well this AM CV- Impella at p9 with 5L/min flow  Co-ox remains around 50 after Epi added Wound Ok   LOS: 12 days    Loreli Slot 12/28/2020

## 2020-12-28 NOTE — Progress Notes (Signed)
Physical Therapy Treatment Patient Details Name: Dylan Chaney MRN: [id number] DOB: 27-Jun-1970 Today's Date: 12/28/2020   History of Present Illness Pt is a 50 y.o. male admitted 12/17/20 with chest pain, SOB. Chest CT with bilateral PE. Echo showed EF < 20%. Decision made for mechanical support as possible bridge to transplant. Now s/p placement of Impella left ventricular assist device 10/11. S/p impella hematoma evacuation 10/15. PMH includes remote drug and alcohol abuse.   PT Comments    Pt with increased malaise and discomfort this session, but still agreeable to participate. Today's session focused on ambulation for increasing activity tolerance and strength; pt moving well with eva walker and supervision to min guard (+2 assist for line/Impella management). Pt remains limited by fatigue, generalized weakness, and decreased activity tolerance. Will continue to follow acutely to address established goals.    Recommendations for follow up therapy are one component of a multi-disciplinary discharge planning process, led by the attending physician.  Recommendations may be updated based on patient status, additional functional criteria and insurance authorization.  Follow Up Recommendations  Home health PT;Supervision for mobility/OOB     Equipment Recommendations  TBD - 3in1 (PT); Rollator    Recommendations for Other Services       Precautions / Restrictions Precautions Precautions: Fall;Other (comment) Precaution Comments: Impella, LIJ CVC Restrictions Weight Bearing Restrictions: Yes RUE Weight Bearing: Partial weight bearing     Mobility  Bed Mobility               General bed mobility comments: Received sitting in recliner    Transfers Overall transfer level: Needs assistance Equipment used: 4-wheeled walker (eva walker) Transfers: Sit to/from Stand Sit to Stand: Supervision;+2 safety/equipment         General transfer comment: Reliant on momentum to power  into standing, supervision for safety/lines; +2 line/impella management  Ambulation/Gait Ambulation/Gait assistance: Supervision;Min guard;+2 safety/equipment Gait Distance (Feet): 180 Feet Assistive device: 4-wheeled walker (eva walker) Gait Pattern/deviations: Step-through pattern;Decreased stride length;Trunk flexed     General Gait Details: Slow, steady, but fatigued gait with eva walker and intermittent min guard for balance; forward flexed trunk, which pt reports he is unable to correct this time secondary to discomfort/fatigue; +2 assist for line/impella management; +3 for chair follow, but pt declining seated rest break   Stairs             Wheelchair Mobility    Modified Rankin (Stroke Patients Only)       Balance Overall balance assessment: Needs assistance   Sitting balance-Leahy Scale: Good       Standing balance-Leahy Scale: Fair Standing balance comment: can static stand without UE support; static and dynamic stability improved with walker                            Cognition Arousal/Alertness: Awake/alert Behavior During Therapy: Flat affect Overall Cognitive Status: Within Functional Limits for tasks assessed                                 General Comments: Pt with flat affect, clearly fatigued and feeling poorly; still pleasant and agreeable to participate      Exercises      General Comments General comments (skin integrity, edema, etc.): Pt endorses increased malaise this session (RN present during session)      Pertinent Vitals/Pain Pain Assessment: Faces Faces Pain Scale: Hurts even more  Pain Location: Impella site, generalized Pain Descriptors / Indicators: Guarding;Tiring;Discomfort Pain Intervention(s): Monitored during session;Limited activity within patient's tolerance    Home Living                      Prior Function            PT Goals (current goals can now be found in the care plan  section) Progress towards PT goals: Progressing toward goals    Frequency    Min 3X/week      PT Plan Current plan remains appropriate    Co-evaluation              AM-PAC PT "6 Clicks" Mobility   Outcome Measure  Help needed turning from your back to your side while in a flat bed without using bedrails?: None Help needed moving from lying on your back to sitting on the side of a flat bed without using bedrails?: A Little Help needed moving to and from a bed to a chair (including a wheelchair)?: A Little Help needed standing up from a chair using your arms (e.g., wheelchair or bedside chair)?: A Little Help needed to walk in hospital room?: A Little Help needed climbing 3-5 steps with a railing? : A Lot 6 Click Score: 18    End of Session   Activity Tolerance: Patient limited by fatigue Patient left: in chair;with call bell/phone within reach;with nursing/sitter in room Nurse Communication: Mobility status PT Visit Diagnosis: Unsteadiness on feet (R26.81);Difficulty in walking, not elsewhere classified (R26.2)     Time: 6237-6283 PT Time Calculation (min) (ACUTE ONLY): 27 min  Charges:  $Therapeutic Exercise: 8-22 mins                     Ina Homes, PT, DPT Acute Rehabilitation Services  Pager [phone] Office [phone]  Malachy Chamber 12/28/2020, 10:54 AM

## 2020-12-28 NOTE — Progress Notes (Signed)
Nutrition Follow-up  DOCUMENTATION CODES:   Severe malnutrition in context of chronic illness  INTERVENTION:   If po intake and appetite remains this poor, recommend liberalizing further  Continue Boost Breeze po TID, each supplement provides 250 kcal and 9 grams of protein  Continue 30 ml ProSource Plus BID, each supplement provides 100 kcals and 15 grams protein.   Continue MVI with Minerals  Add double portion of protein on meal trays   NUTRITION DIAGNOSIS:   Severe Malnutrition related to chronic illness (PE, CHF, renal insufficiency) as evidenced by moderate fat depletion, moderate muscle depletion, percent weight loss, energy intake < 75% for > or equal to 1 month.   GOAL:   Patient will meet greater than or equal to 90% of their needs  Not Met  MONITOR:   PO intake, Supplement acceptance, Weight trends  REASON FOR ASSESSMENT:   Rounds (poor PO intake)    ASSESSMENT:   Pt admitted 10/6 with SOB, chest and abdominal pain x3 months . 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 PMH includes remote drug and alcohol abuse, prostate enlargement, and renal insufficiency.  10/14 OR for Impella site hematoma washout 10/17 Epi added  Awaiting insurance for possible transfer to [facility] Currently on Levophed, epinephrine and milrinone. Impella P9  Today pt does not feel well; fatigued, +nausea. Appetite is poor today. Pt did not eat breakfast but did take his Pro-Source.   Prior to today, pt eating fairly well, recorded po intake 75-100%. Pt has been previously taking Boost Breeze and Pro-Source  Current wt 94.5 kg; lowest weight 87.5 kg on 10/14  Labs: sodium 127, CBGs 167-254, Creatinine wdl Meds: lasix, ss novolog, semglee   Diet Order:   Diet Order             Diet heart healthy/carb modified Room service appropriate? Yes; Fluid consistency: Thin; Fluid restriction: 1800 mL  Fluid  Diet effective now                   EDUCATION NEEDS:   Education needs have been addressed  Skin:  Skin Assessment: Skin Integrity Issues: Skin Integrity Issues:: Incisions Incisions: R chest  Last BM:  PTA  Height:   Ht Readings from Last 1 Encounters:  12/17/20 _0  (1.854 m)    Weight:   Wt Readings from Last 1 Encounters:  12/28/20 94.5 kg     BMI:  Body mass index is 27.49 kg/m.  Estimated Nutritional Needs:   Kcal:  2400-2700  Protein:  120-135  Fluid:  >2.4L   Kerman Passey MS, RDN, LDN, CNSC Registered Dietitian III Clinical Nutrition RD Pager and On-Call Pager Number Located in [place]

## 2020-12-28 NOTE — Plan of Care (Signed)
  Problem: Pain Managment: Goal: General experience of comfort will improve Outcome: Progressing   Problem: Education: Goal: Ability to demonstrate management of disease process will improve Outcome: Progressing   Problem: Activity: Goal: Capacity to carry out activities will improve Outcome: Progressing   Problem: Cardiac: Goal: Ability to achieve and maintain adequate cardiopulmonary perfusion will improve Outcome: Progressing

## 2020-12-28 NOTE — TOC Progression Note (Addendum)
Transition of Care [facility]) - Progression Note    Patient Details  Name: Dylan Chaney MRN: [id number] Date of Birth: Apr 27, 1970  [facility] CM/SW Contact  Kenn Rekowski, LCSWA Phone Number: 12/28/2020, 12:58 PM  Clinical Narrative:    HF CSW received a message from [facility] with [employer] regarding the Medicaid. Joaine reported that the case has been assigned to Sonny Dandy at DSS no phone number provided only an email [email or web address]. CSW reached out via e-mail asking for a phone number and how best to support the caseworker with the Medicaid process for Mr. Gelpi. HF CSW and outpatient HF CSW attempted to call DSS over the phone but was on hold for over 40 minutes without any success in getting in touch with anyone. Clement Husbands is also reaching out to the caseworker and expressing urgency with Mr. Tagliaferro and the Medicaid. HF CSW will continue to outreach and follow up on the Medicaid.  HF CSW received a call from the patient's sister, Gust Rung [phone] asking for a medical update regarding her brother and if the Cardiologist could reach out to her. CSW passed her phone number along to the treatment team for an update. Kendal Hymen reported that the advanced directive and the living will have not been notarized and that the paperwork was filled out last week. CSW reached out to the chaplain to see about getting the paperwork notarized from last week and the chaplain reported that the notary is not in but will see if they can get it done today or tomorrow.  CSW will continue to follow throughout discharge.   Expected Discharge Plan: Home w Home Health Services Barriers to Discharge: Continued Medical Work up  Expected Discharge Plan and Services Expected Discharge Plan: Home w Home Health Services In-house Referral: Clinical Social Work Discharge Planning Services: CM Consult Post Acute Care Choice: Home Health Living arrangements for the past 2  months: Hotel/Motel                                       Social Determinants of Health (SDOH) Interventions Financial Strain Interventions: Artist Housing Interventions: Other (Comment) (Pt. reports living at a [facility] [address] [place]) Transportation Interventions: [employer]  Readmission Risk Interventions No flowsheet data found.  Dublin Grayer, MSW, LCSWA [phone] Heart Failure Social Worker

## 2020-12-28 NOTE — Progress Notes (Signed)
ANTICOAGULATION CONSULT NOTE - Follow Up Consult  Pharmacy Consult for Heparin Indication: Impella/ pulmonary embolus  No Known Allergies  Patient Measurements: Height: 6\' 1"  (185.4 cm) Weight: 91.7 kg (202 lb 2.6 oz) IBW/kg (Calculated) : 79.9 Heparin Dosing Weight: 92.1 kg  Vital Signs: Temp: 98.2 F (36.8 C) (10/18 0300) Temp Source: Oral (10/18 0300) BP: 105/85 (10/18 0400) Pulse Rate: 93 (10/18 0400)  Labs: Recent Labs    12/25/20 1400 12/26/20 0445 12/26/20 0445 12/26/20 1914 12/27/20 0341 12/27/20 0700 12/28/20 0357  HGB  --  9.7*   < > 10.1* 9.5*  --  10.0*  HCT  --  27.9*   < > 29.5* 27.8*  --  29.1*  PLT  --  127*   < > 143* 129*  --  142*  HEPARINUNFRC  --  <0.10*  --   --   --  <0.10* 0.15*  CREATININE 1.04 1.04  --   --  1.11  --   --    < > = values in this interval not displayed.     Estimated Creatinine Clearance: 89 mL/min (by C-G formula based on SCr of 1.11 mg/dL).   Medications:  Infusions:   sodium chloride 10 mL/hr at 12/28/20 0400   amiodarone 60 mg/hr (12/28/20 0400)   epinephrine 1 mcg/min (12/28/20 0400)   heparin 50 units/mL (Impella PURGE) in dextrose 5 % 1000 mL bag 10.9 mL/hr at 12/21/20 1100   heparin 600 Units/hr (12/28/20 0400)   milrinone 0.5 mcg/kg/min (12/28/20 0400)   norepinephrine (LEVOPHED) Adult infusion 16 mcg/min (12/28/20 0400)     Assessment: 50 yo male with acute bilateral PE, moderate clot burden. Pharmacy consulted to dose heparin drip for PE.  No prior AC noted.  Patient returned from surgery 10/11 s/p impella 5.5 placement. Patient with oozing and continued increase in size of hematoma. Patient taken back to OR for evacuation 10/14. Heparin was restarted at a fixed rate of 600 units/hour. Pressure dressing intact, continues to ooze and hematoma still present.  Impella purge providing 580 units/hr of heparin (concentration 50 units/m, flow 11.9 mL/hr). Heparin level this morning was 0.15. Due to hematoma and  oozing, Dr. 11/14 would like to continue at 600 units/hr  Goal of Therapy:  Heparin level 0.3-0.5 units/ml for Impella + PE Monitor platelets by anticoagulation protocol: Yes   Plan:  Continue IV heparin at 600 units/hr Impella purge providing 595  units/hour of heparin  Daily CBC and heparin level Monitor for signs of bleeding  Thank you for allowing pharmacy to participate in this patient's care.  Gala Romney, PharmD PGY1 Pharmacy Resident 12/28/2020 5:52 AM Check [email or web address] for unit specific pharmacy number

## 2020-12-29 DIAGNOSIS — I5020 Unspecified systolic (congestive) heart failure: Secondary | ICD-10-CM

## 2020-12-29 LAB — CBC
HCT: 27.7 % — ABNORMAL LOW (ref 39.0–52.0)
Hemoglobin: 9.7 g/dL — ABNORMAL LOW (ref 13.0–17.0)
MCH: 28 pg (ref 26.0–34.0)
MCHC: 35 g/dL (ref 30.0–36.0)
MCV: 80.1 fL (ref 80.0–100.0)
Platelets: 160 10*3/uL (ref 150–400)
RBC: 3.46 MIL/uL — ABNORMAL LOW (ref 4.22–5.81)
RDW: 13.8 % (ref 11.5–15.5)
WBC: 10.8 10*3/uL — ABNORMAL HIGH (ref 4.0–10.5)
nRBC: 0 % (ref 0.0–0.2)

## 2020-12-29 LAB — BASIC METABOLIC PANEL
Anion gap: 10 (ref 5–15)
Anion gap: 9 (ref 5–15)
BUN: 20 mg/dL (ref 6–20)
BUN: 23 mg/dL — ABNORMAL HIGH (ref 6–20)
CO2: 27 mmol/L (ref 22–32)
CO2: 28 mmol/L (ref 22–32)
Calcium: 8.7 mg/dL — ABNORMAL LOW (ref 8.9–10.3)
Calcium: 8.6 mg/dL — ABNORMAL LOW (ref 8.9–10.3)
Chloride: 88 mmol/L — ABNORMAL LOW (ref 98–111)
Chloride: 88 mmol/L — ABNORMAL LOW (ref 98–111)
Creatinine, Ser: 1.17 mg/dL (ref 0.61–1.24)
Creatinine, Ser: 1.27 mg/dL — ABNORMAL HIGH (ref 0.61–1.24)
GFR, Estimated: >60 mL/min (ref >60)
GFR, Estimated: >60 mL/min (ref >60)
Glucose, Bld: 163 mg/dL — ABNORMAL HIGH (ref 70–99)
Glucose, Bld: 166 mg/dL — ABNORMAL HIGH (ref 70–99)
Potassium: 3.7 mmol/L (ref 3.5–5.1)
Potassium: 4.1 mmol/L (ref 3.5–5.1)
Sodium: 125 mmol/L — ABNORMAL LOW (ref 135–145)
Sodium: 125 mmol/L — ABNORMAL LOW (ref 135–145)

## 2020-12-29 LAB — COOXEMETRY PANEL
Carboxyhemoglobin: 1.6 % — ABNORMAL HIGH (ref 0.5–1.5)
Methemoglobin: 0.9 % (ref 0.0–1.5)
O2 Saturation: 54.9 %
Total hemoglobin: 8.6 g/dL — ABNORMAL LOW (ref 12.0–16.0)

## 2020-12-29 LAB — GLUCOSE, CAPILLARY
Glucose-Capillary: 175 mg/dL — ABNORMAL HIGH (ref 70–99)
Glucose-Capillary: 227 mg/dL — ABNORMAL HIGH (ref 70–99)
Glucose-Capillary: 148 mg/dL — ABNORMAL HIGH (ref 70–99)
Glucose-Capillary: 134 mg/dL — ABNORMAL HIGH (ref 70–99)

## 2020-12-29 LAB — HEPARIN LEVEL (UNFRACTIONATED): Heparin Unfractionated: <0.1 IU/mL — ABNORMAL LOW (ref 0.30–0.70)

## 2020-12-29 LAB — LACTATE DEHYDROGENASE: LDH: 338 U/L — ABNORMAL HIGH (ref 98–192)

## 2020-12-29 MED ORDER — TOLVAPTAN 15 MG PO TABS
15.0000 mg | ORAL_TABLET | Freq: Once | ORAL | Status: DC
  Filled 2020-12-29: qty 1

## 2020-12-29 MED ORDER — INSULIN ASPART 100 UNIT/ML IJ SOLN
3.0000 [IU] | Freq: Three times a day (TID) | INTRAMUSCULAR | Status: DC
  Administered 2020-12-29 – 2020-12-30 (×3): 3 [IU] via SUBCUTANEOUS

## 2020-12-29 MED ORDER — POTASSIUM CHLORIDE CRYS ER 20 MEQ PO TBCR
40.0000 meq | EXTENDED_RELEASE_TABLET | Freq: Once | ORAL | Status: AC
  Administered 2020-12-29: 40 meq via ORAL
  Filled 2020-12-29: qty 2

## 2020-12-29 MED ORDER — FUROSEMIDE 10 MG/ML IJ SOLN
80.0000 mg | Freq: Once | INTRAMUSCULAR | Status: AC
  Administered 2020-12-29: 80 mg via INTRAVENOUS
  Filled 2020-12-29: qty 8

## 2020-12-29 NOTE — Plan of Care (Signed)
  Problem: Elimination: Goal: Will not experience complications related to bowel motility Outcome: Progressing   Problem: Pain Managment: Goal: General experience of comfort will improve Outcome: Progressing   Problem: Cardiac: Goal: Ability to achieve and maintain adequate cardiopulmonary perfusion will improve Outcome: Not Progressing

## 2020-12-29 NOTE — Progress Notes (Signed)
CSW met at bedside with patient, sister and mother. Patient resting comfortably and discussion around documents needed for medicaid application. Drue Flirt from [employer] joined [place] with news from [facility] office requesting ID and SSN card. Copies made and she will forward to county office. CSW received call from supervisor stating that the application is in process and just awaiting word of assigned DDS worker for disability review. CSW will continue to collaborate with [employer] and Medicaid office to expedite application.  Patient and family grateful for the support and assistance. Raquel Sarna, [place], [place]

## 2020-12-29 NOTE — Progress Notes (Addendum)
Patient ID: Dylan Chaney, male   DOB: 1970/10/14, 50 y.o.   MRN: [id number]    Advanced Heart Failure Rounding Note   Subjective:    - 10/11 Impella 5.5 placed - 12/22/20 Impllea turned up to P9 and milrinone increased to 0.5 mcg. Given dose of tolvaptan.  - 10/14 To OR for Impella site hematoma washout.   Continues on Impella at P-9 (Flow 5.0) + Epi 2 + NE + 16+ Milrinone 0.5. Co-ox 55%.  CI by Fick Calculation 2.3L/min2   5.8L in UOP yesterday. Wt down 4 lb. CVP 14 SCr stable 1.17, K 3.7  Na down to 125   KUB 10/18 unremarkable. Large BM overnight. Abdomen feels better. Nausea improved.  Hematoma at Impella site stable. Did not require dressing change overnight.  Hgb relatively stable at 9.7. Plts trending up, 129>>142>>160. Had minor nose bleed overnight, now resolved.   LDH 304>>289>>312>>323>>338   Feels much better today. Less tired today. OBB sitting up in chair.   Objective:   Weight Range:  Vital Signs:   Temp:  [97.7 F (36.5 C)-98.5 F (36.9 C)] 98.5 F (36.9 C) (10/19 0300) Pulse Rate:  [41-130] 41 (10/18 1900) Resp:  [11-23] 20 (10/19 0600) BP: (78-119)/(30-93) 88/77 (10/19 0600) SpO2:  [80 %-100 %] 97 % (10/19 0600) Weight:  [92.7 kg] 92.7 kg (10/19 0600) Last BM Date: 12/28/20  Weight change: Filed Weights   12/27/20 0600 12/28/20 0700 12/29/20 0600  Weight: 91.7 kg 94.5 kg 92.7 kg    Intake/Output:   Intake/Output Summary (Last 24 hours) at 12/29/2020 0714 Last data filed at 12/29/2020 0700 Gross per 24 hour  Intake 3300.69 ml  Output 6820 ml  Net -3519.31 ml   PHYSICAL EXAM: CVP 14  General:  mildly fatigued appearing, sitting up in chair. No respiratory difficulty HEENT: normal Neck: supple. JVD 12-13 cm Carotids 2+ bilat; no bruits. No lymphadenopathy or thyromegaly appreciated.+ Rt axillary impella + hematoma site bandaged w/ clean dressing.  Cor: PMI nondisplaced. Regular rhythm, mildly tachy rate. No rubs, gallops or  murmurs. Lungs: decreased BS at bases  Abdomen: soft, nontender, nondistended. No hepatosplenomegaly. No bruits or masses. Good bowel sounds. Extremities: no cyanosis, clubbing, rash, 2+ bilateral LE edema + Unna boots  Neuro: alert & oriented x 3, cranial nerves grossly intact. moves all 4 extremities w/o difficulty. Affect pleasant.   Telemetry:  Sinus tach 100, no further NSVT  (personally reviewed)   Labs: Basic Metabolic Panel: Recent Labs  Lab 12/22/20 1322 12/23/20 0408 12/23/20 0435 12/24/20 0100 12/24/20 1744 12/25/20 1400 12/26/20 0445 12/27/20 0341 12/28/20 0357 12/29/20 0437  NA 127* 132*   < > 132*   < > 128* 127* 127* 127* 125*  K 5.2* 4.3   < > 4.0   < > 4.0 3.8 3.6 4.2 3.7  CL 94* 95*  --  95*   < > 91* 91* 92* 93* 88*  CO2 25 28  --  28   < > 27 24 27 26 27   GLUCOSE 212* 223*  --  164*   < > 268* 278* 189* 165* 163*  BUN 11 12  --  16   < > 14 17 20  22* 20  CREATININE 1.08 1.09  --  1.05   < > 1.04 1.04 1.11 1.10 1.17  CALCIUM 8.2* 8.6*  --  8.4*   < > 8.4* 8.2* 8.2* 8.5* 8.7*  MG 1.8 2.4  --  2.0  --   --   --  1.9  --   --    < > = values in this interval not displayed.    Liver Function Tests: No results for input(s): AST, ALT, ALKPHOS, BILITOT, PROT, ALBUMIN in the last 168 hours.  No results for input(s): LIPASE, AMYLASE in the last 168 hours. No results for input(s): AMMONIA in the last 168 hours.  CBC: Recent Labs  Lab 12/26/20 0445 12/26/20 1914 12/27/20 0341 12/28/20 0357 12/29/20 0437  WBC 11.1* 11.6* 11.3* 10.5 10.8*  HGB 9.7* 10.1* 9.5* 10.0* 9.7*  HCT 27.9* 29.5* 27.8* 29.1* 27.7*  MCV 82.1 81.9 82.2 81.1 80.1  PLT 127* 143* 129* 142* 160    Cardiac Enzymes: No results for input(s): CKTOTAL, CKMB, CKMBINDEX, TROPONINI in the last 168 hours.   BNP: BNP (last 3 results) Recent Labs    12/15/20 1716 12/17/20 0457  BNP 2,024.7* 1,742.2*    ProBNP (last 3 results) No results for input(s): PROBNP in the last 8760  hours.    Other results:  Imaging: DG Abd 1 View  Result Date: 12/29/2020 CLINICAL DATA:  Evaluate for possible ileus, initial encounter EXAM: ABDOMEN - 1 VIEW COMPARISON:  12/20/2020 FINDINGS: Scattered large and small bowel gas is noted. No significant dilatation is seen. No free air is noted. No abnormal mass or abnormal calcifications are noted. Bony structures are within normal limits. Impella device is noted in the chest but incompletely evaluated. IMPRESSION: No findings to suggest ileus or obstruction are seen. Electronically Signed   By: Alcide Clever M.D.   On: 12/29/2020 00:12     Medications:     Scheduled Medications:  (feeding supplement) PROSource Plus  30 mL Oral TID BM   aspirin EC  81 mg Oral Daily   Chlorhexidine Gluconate Cloth  6 each Topical Daily   digoxin  0.125 mg Oral Daily   docusate sodium  100 mg Oral BID   feeding supplement  1 Container Oral TID BM   insulin aspart  0-15 Units Subcutaneous TID WC   insulin aspart  0-5 Units Subcutaneous QHS   insulin glargine-yfgn  20 Units Subcutaneous Daily   metoCLOPramide (REGLAN) injection  10 mg Intravenous Q8H   multivitamin with minerals  1 tablet Oral Daily   pantoprazole  40 mg Oral Daily   polyethylene glycol  17 g Oral Daily   tamsulosin  0.4 mg Oral QPC supper    Infusions:  sodium chloride 10 mL/hr at 12/29/20 0700   amiodarone 60 mg/hr (12/29/20 0700)   epinephrine 2 mcg/min (12/29/20 0700)   heparin 50 units/mL (Impella PURGE) in dextrose 5 % 1000 mL bag 10.9 mL/hr at 12/21/20 1100   heparin 600 Units/hr (12/29/20 0700)   milrinone 0.5 mcg/kg/min (12/29/20 0700)   norepinephrine (LEVOPHED) Adult infusion 16 mcg/min (12/29/20 0700)    PRN Medications: sodium chloride, acetaminophen, bisacodyl, lip balm, ondansetron **OR** ondansetron (ZOFRAN) IV, oxyCODONE, sodium chloride flush, traMADol   Assessment/Plan:   1. 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 Will need cath +/- cMRI when more stable - Impella 5.5 placed 10/11.  Speed increased to P9. Would continue current speed. Clear urine.  LDH up 289>>312>>323>>338.  - Remains on milrinone 0.5 mcg + norepi 16 mcg + Epi 2. CO-OX marginal 55%. CI  2.3 by Fick calculation   - Remains very tenuous despite current support, May need Protek Duo for RV support. - CVP 14. Give 1 x dose 80 IV Lasix today. Careful about lowering CVP too aggressively with RV failure.  - Continue digoxin 0.125, level ok - Given social situation advanced therapies would be challenging but if he does not recover with medical therapy, he has extensive support from [facility]. We will work to get him Medicaid to keep transplant option on the table for him. Not VAD candidate with severe RV dysfunction - HCV quant is negative  - Blood type B+ - Dr Gala Romney spoke to Dr. Edwena Blow at [facility]. They would consider him for transplant evaluation (will need Medicaid first).    2. Acute PE  - CTA + bilateral segmental/subsegmental PE likely due to low output - Heparin restarted last night (post-hematoma evacuation).  - PLTs 79>74k>94>120>127>129>142>160  - HIT panel negative.  - SRA negative   3. Acute blood loss anemia - bleeding from Impella site on 10/11 => heparin stopped 10/14 and patient had hematoma evacuation by Dr. Dorris Fetch => heparin restarted.    - On heparin gtt for PE + Impella  - Hgb stable 9.7 today. Monitor   4. Shock liver - improving with hemodynamic support   5. AKI on CKD 3B - baseline SCr 1.8-2.0 - due to ATN from shock  - Creatinine normalized.   6. Thrombocytopenia - Platelets starting to increase, up to 160 today.  - HIT panel negative.  - SRA negative    - Doubt significant hemolysis at this point (stable LDH and rising  plts), no evidence for HIT.    7. HCV - viral RNA Quant negative   8. 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 Hyponatremia - 10/12, 10/14 Tolvaptan  - Na 125 today, give Tolvaptan x 1 - Fluid restrict.    10. NSVT - less ectopy w/ amio gtt increase, continue at 60/hr  - continue amio gtt while on dual inotropes - keep K > 4.0 and Mg >2.0   11. Constipation  - Resolved w/ reglan and sorbitol  - KUB 10/18 negative  12. [facility] needs - Dr Gala Romney spoke with his caseworker April Anders [phone]). He was incarcerated almost a decade ago but not since. He has been homeless for years but recently moved into a hotel in June through the [facility] 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 No substance use. Always first to volunteer to help other [employer] members move and do other tasks. IRC members are very attached to him and said they would drive him back and forth to [facility] for transplant eval,if needed. Other IRC contact: Brother John: [phone]. His sister and his mother have been in to see and support him.   Await Medicaid. Once he has insurance consider transfer to [facility] for transplant eval.    Robbie Lis, PA-C  7:14 AM   Agree with above.  Remains on Impella 5.5 at P-9, milrinone 0.5. epi 2 and NE 16. Co-ox improving. Diuresed well yesterday. Also had good BM. Feels a bit better but still weak. CVP remains 14. Co-ox 55%  General:  Sitting in chair. Weak appearing. No resp difficulty HEENT: normal Neck: supple. JVP to ear Carotids 2+ bilat; no bruits. No  lymphadenopathy or thryomegaly appreciated. Cor: impella site ok  Regular rate & rhythm. 2/6 TR. Lungs: clear Abdomen: soft, nontender, mildly distended. No hepatosplenomegaly. No bruits or masses. Good bowel sounds. Extremities: no cyanosis,  clubbing, rash, 1+ edema Neuro: alert & orientedx3, cranial nerves grossly intact. moves all 4 extremities w/o difficulty. Affect pleasant  Remains very tenuous with severe biventricular failure. Co-ox marginal despite Impella 5.5 support (waveforms ok) and ep/milrinone/NE. RV indices improved with addition of epi. He remains a bit reluctant about mechanical RV support. Will continue current regimen. Diurese today. Give tolvaptan. Try to mobilize. F/u with SW regarding Medicaid app.   CRITICAL CARE Performed by: Arvilla Meres  Total critical care time: 40 minutes  Critical care time was exclusive of separately billable procedures and treating other patients.  Critical care was necessary to treat or prevent imminent or life-threatening deterioration.  Critical care was time spent personally by me (independent of midlevel providers or residents) on the following activities: development of treatment plan with patient and/or surrogate as well as nursing, discussions with consultants, evaluation of patient's response to treatment, examination of patient, obtaining history from patient or surrogate, ordering and performing treatments and interventions, ordering and review of laboratory studies, ordering and review of radiographic studies, pulse oximetry and re-evaluation of patient's condition.  Arvilla Meres, MD  3:15 PM

## 2020-12-29 NOTE — Progress Notes (Signed)
5 Days Post-Op Procedure(s) (LRB): EVACUATION HEMATOMA (N/A) Subjective: Feels better today  Objective: Vital signs in last 24 hours: Temp:  [97.7 F (36.5 C)-98.5 F (36.9 C)] 98 F (36.7 C) (10/19 0822) Pulse Rate:  [41-97] 92 (10/19 0822) Cardiac Rhythm: Normal sinus rhythm (10/18 1900) Resp:  [11-22] 21 (10/19 0822) BP: (78-119)/(30-93) 88/77 (10/19 0600) SpO2:  [80 %-100 %] 97 % (10/19 0600) Weight:  [92.7 kg] 92.7 kg (10/19 0600)  Hemodynamic parameters for last 24 hours: CVP:  [11 mmHg-19 mmHg] 15 mmHg  Intake/Output from previous day: 10/18 0701 - 10/19 0700 In: 3300.7 [P.O.:1047; I.V.:1999.9] Out: 6820 [Urine:5820; Stool:1000] Intake/Output this shift: Total I/O In: 240 [P.O.:240] Out: -   General appearance: alert, cooperative, and no distress Neurologic: intact Heart: regular rate and rhythm Wound: stable from yesterday  Lab Results: Recent Labs    12/28/20 0357 12/29/20 0437  WBC 10.5 10.8*  HGB 10.0* 9.7*  HCT 29.1* 27.7*  PLT 142* 160   BMET:  Recent Labs    12/28/20 0357 12/29/20 0437  NA 127* 125*  K 4.2 3.7  CL 93* 88*  CO2 26 27  GLUCOSE 165* 163*  BUN 22* 20  CREATININE 1.10 1.17  CALCIUM 8.5* 8.7*    PT/INR: No results for input(s): LABPROT, INR in the last 72 hours. ABG    Component Value Date/Time   PHART 7.470 (H) 12/23/2020 0435   HCO3 28.7 (H) 12/23/2020 0435   TCO2 30 12/24/2020 1744   ACIDBASEDEF 2.0 12/22/2020 0415   O2SAT 54.9 12/29/2020 0438   CBG (last 3)  Recent Labs    12/28/20 1711 12/28/20 2237 12/29/20 0658  GLUCAP 256* 128* 175*    Assessment/Plan: S/P Procedure(s) (LRB): EVACUATION HEMATOMA (N/A) - Feels better today after addition of epi yesterday Impella at p9 with 5L flow On milrinone 0.5, epi 2 and norepi 16. Co-ox up slightly to 55   LOS: 13 days    Loreli Slot 12/29/2020

## 2020-12-29 NOTE — Progress Notes (Signed)
ANTICOAGULATION CONSULT NOTE - Follow Up Consult  Pharmacy Consult for Heparin Indication: Impella/ pulmonary embolus  No Known Allergies  Patient Measurements: Height: 6\' 1"  (185.4 cm) Weight: 92.7 kg (204 lb 5.9 oz) IBW/kg (Calculated) : 79.9 Heparin Dosing Weight: 92.1 kg  Vital Signs: Temp: 98 F (36.7 C) (10/19 0822) Temp Source: Axillary (10/19 0822) BP: 88/77 (10/19 0600) Pulse Rate: 97 (10/19 0947)  Labs: Recent Labs    12/27/20 0341 12/27/20 0700 12/28/20 0357 12/29/20 0437  HGB 9.5*  --  10.0* 9.7*  HCT 27.8*  --  29.1* 27.7*  PLT 129*  --  142* 160  HEPARINUNFRC  --  <0.10* 0.15* <0.10*  CREATININE 1.11  --  1.10 1.17     Estimated Creatinine Clearance: 84.4 mL/min (by C-G formula based on SCr of 1.17 mg/dL).   Medications:  Infusions:   sodium chloride 10 mL/hr at 12/29/20 0700   amiodarone 60 mg/hr (12/29/20 1023)   epinephrine 2 mcg/min (12/29/20 0700)   heparin 50 units/mL (Impella PURGE) in dextrose 5 % 1000 mL bag 10.9 mL/hr at 12/21/20 1100   heparin 600 Units/hr (12/29/20 0727)   milrinone 0.5 mcg/kg/min (12/29/20 0943)   norepinephrine (LEVOPHED) Adult infusion 16 mcg/min (12/29/20 0700)     Assessment: 50 yo male with acute bilateral PE, moderate clot burden. Pharmacy consulted to dose heparin drip for PE.  No prior AC noted.  Patient returned from surgery 10/11 s/p impella 5.5 placement. Patient taken to OR for evacuation 10/14. Oozing from impella site has improved. Minor nose bleed 10/19. Heparin remains at a fixed rate of 600 units/hour.   Impella purge providing 575 units/hr of heparin (concentration 50 units/mL, flow 11.5 mL/hr). Heparin level this morning was undetectable. Will follow heart failure team's plan for heparin dosing in the setting of hematoma.  Goal of Therapy:  Heparin level 0.3-0.5 units/ml for Impella + PE Monitor platelets by anticoagulation protocol: Yes   Plan:  Continue IV heparin at 600 units/hr Impella  purge providing 575  units/hour of heparin for total dose of 1175 units/hour Daily CBC and heparin level Monitor for signs of bleeding  Thank you for allowing pharmacy to participate in this patient's care.  11/19, PharmD PGY1 Pharmacy Resident 12/29/2020 10:49 AM Check [email or web address] for unit specific pharmacy number

## 2020-12-29 NOTE — Progress Notes (Signed)
Inpatient Diabetes Program Recommendations  AACE/ADA: New Consensus Statement on Inpatient Glycemic Control (2015)  Target Ranges:  Prepandial:   less than 140 mg/dL      Peak postprandial:   less than 180 mg/dL (1-2 hours)      Critically ill patients:  140 - 180 mg/dL   Lab Results  Component Value Date   GLUCAP 175 (H) 12/29/2020   HGBA1C 6.5 (H) 12/23/2020    Review of Glycemic Control Results for Dylan Chaney, Dylan Chaney (MRN [id number]) as of 12/29/2020 10:18  Ref. Range 12/28/2020 11:18 12/28/2020 15:58 12/28/2020 17:11 12/28/2020 22:37 12/29/2020 06:58  Glucose-Capillary Latest Ref Range: 70 - 99 mg/dL 970 (H) 263 (H) 785 (H) 128 (H) 175 (H)   Diabetes history: ? New diagnosis Outpatient Diabetes medications: None Current orders for Inpatient glycemic control:  Semglee 20 units daily  Novolog moderate tid with meals and HS  Inpatient Diabetes Program Recommendations:    Please increase Semglee to 25 units daily and add Novolog meal coverage 3 units tid with meals (hold if patient eats less than 50% or NPO).   Thanks,  Beryl Meager, RN, BC-ADM Inpatient Diabetes Coordinator Pager [phone]  (8a-5p)

## 2020-12-30 LAB — COOXEMETRY PANEL
Carboxyhemoglobin: 1.9 % — ABNORMAL HIGH (ref 0.5–1.5)
Methemoglobin: 1 % (ref 0.0–1.5)
O2 Saturation: 55.5 %
Total hemoglobin: 10.1 g/dL — ABNORMAL LOW (ref 12.0–16.0)

## 2020-12-30 LAB — BASIC METABOLIC PANEL
Anion gap: 13 (ref 5–15)
BUN: 24 mg/dL — ABNORMAL HIGH (ref 6–20)
CO2: 25 mmol/L (ref 22–32)
Calcium: 8.7 mg/dL — ABNORMAL LOW (ref 8.9–10.3)
Chloride: 88 mmol/L — ABNORMAL LOW (ref 98–111)
Creatinine, Ser: 1.28 mg/dL — ABNORMAL HIGH (ref 0.61–1.24)
GFR, Estimated: >60 mL/min (ref >60)
Glucose, Bld: 154 mg/dL — ABNORMAL HIGH (ref 70–99)
Potassium: 3.9 mmol/L (ref 3.5–5.1)
Sodium: 126 mmol/L — ABNORMAL LOW (ref 135–145)

## 2020-12-30 LAB — GLUCOSE, CAPILLARY
Glucose-Capillary: 165 mg/dL — ABNORMAL HIGH (ref 70–99)
Glucose-Capillary: 176 mg/dL — ABNORMAL HIGH (ref 70–99)
Glucose-Capillary: 219 mg/dL — ABNORMAL HIGH (ref 70–99)
Glucose-Capillary: 152 mg/dL — ABNORMAL HIGH (ref 70–99)

## 2020-12-30 LAB — CBC
HCT: 28.4 % — ABNORMAL LOW (ref 39.0–52.0)
Hemoglobin: 9.8 g/dL — ABNORMAL LOW (ref 13.0–17.0)
MCH: 27.9 pg (ref 26.0–34.0)
MCHC: 34.5 g/dL (ref 30.0–36.0)
MCV: 80.9 fL (ref 80.0–100.0)
Platelets: 177 10*3/uL (ref 150–400)
RBC: 3.51 MIL/uL — ABNORMAL LOW (ref 4.22–5.81)
RDW: 13.6 % (ref 11.5–15.5)
WBC: 11 10*3/uL — ABNORMAL HIGH (ref 4.0–10.5)
nRBC: 0.2 % (ref 0.0–0.2)

## 2020-12-30 LAB — MAGNESIUM: Magnesium: 1.9 mg/dL (ref 1.7–2.4)

## 2020-12-30 LAB — LACTATE DEHYDROGENASE: LDH: 359 U/L — ABNORMAL HIGH (ref 98–192)

## 2020-12-30 LAB — HEPARIN LEVEL (UNFRACTIONATED): Heparin Unfractionated: <0.1 IU/mL — ABNORMAL LOW (ref 0.30–0.70)

## 2020-12-30 MED ORDER — POTASSIUM CHLORIDE CRYS ER 20 MEQ PO TBCR
40.0000 meq | EXTENDED_RELEASE_TABLET | Freq: Two times a day (BID) | ORAL | Status: DC
  Administered 2020-12-30 – 2021-01-01 (×5): 40 meq via ORAL
  Filled 2020-12-30 (×6): qty 2

## 2020-12-30 MED ORDER — INSULIN ASPART 100 UNIT/ML IJ SOLN: 5.0000 [IU] | Freq: Three times a day (TID) | INTRAMUSCULAR | Status: DC

## 2020-12-30 MED ORDER — SCOPOLAMINE 1 MG/3DAYS TD PT72
1.0000 | MEDICATED_PATCH | TRANSDERMAL | Status: DC
  Administered 2020-12-30 – 2021-01-05 (×3): 1.5 mg via TRANSDERMAL
  Filled 2020-12-30 (×3): qty 1

## 2020-12-30 MED ORDER — INSULIN GLARGINE-YFGN 100 UNIT/ML ~~LOC~~ SOLN
24.0000 [IU] | Freq: Every day | SUBCUTANEOUS | Status: DC
  Administered 2020-12-31 – 2021-01-05 (×6): 24 [IU] via SUBCUTANEOUS
  Filled 2020-12-30 (×7): qty 0.24

## 2020-12-30 MED ORDER — POTASSIUM CHLORIDE CRYS ER 20 MEQ PO TBCR
40.0000 meq | EXTENDED_RELEASE_TABLET | Freq: Once | ORAL | Status: AC
  Administered 2020-12-30: 40 meq via ORAL
  Filled 2020-12-30: qty 2

## 2020-12-30 MED ORDER — FUROSEMIDE 10 MG/ML IJ SOLN
80.0000 mg | Freq: Two times a day (BID) | INTRAMUSCULAR | Status: DC
  Administered 2020-12-30 – 2021-01-03 (×10): 80 mg via INTRAVENOUS
  Filled 2020-12-30 (×10): qty 8

## 2020-12-30 MED ORDER — MAGNESIUM SULFATE 2 GM/50ML IV SOLN
2.0000 g | Freq: Once | INTRAVENOUS | Status: AC
  Administered 2020-12-30: 2 g via INTRAVENOUS
  Filled 2020-12-30: qty 50

## 2020-12-30 MED ORDER — SORBITOL 70 % SOLN
30.0000 mL | Freq: Once | Status: AC
  Administered 2020-12-30: 30 mL via ORAL
  Filled 2020-12-30: qty 30

## 2020-12-30 MED ORDER — INSULIN ASPART 100 UNIT/ML IJ SOLN
3.0000 [IU] | Freq: Three times a day (TID) | INTRAMUSCULAR | Status: DC
  Administered 2020-12-30 – 2021-01-05 (×19): 3 [IU] via SUBCUTANEOUS

## 2020-12-30 NOTE — Progress Notes (Signed)
Physical Therapy Treatment Patient Details Name: Dylan Chaney MRN: [id number] DOB: [birth year] Today's Date: 12/30/2020   History of Present Illness Pt is a 50 y.o. male admitted 12/17/20 with chest pain, SOB. Chest CT with bilateral PE. Echo showed EF < 20%. Decision made for mechanical support as possible bridge to transplant. Now s/p placement of Impella left ventricular assist device 10/11. S/p impella hematoma evacuation 10/15. PMH includes remote drug and alcohol abuse.    PT Comments    The pt continues to make slow but steady progress with mobility despite reports of continued fatigue today. He was able to maintain with RW and improved gait speed with no need for cues for self-monitoring of exertion. Will continue to benefit from skilled PT to progress dynamic stability and endurance.     Recommendations for follow up therapy are one component of a multi-disciplinary discharge planning process, led by the attending physician.  Recommendations may be updated based on patient status, additional functional criteria and insurance authorization.  Follow Up Recommendations  Home health PT;Supervision for mobility/OOB     Equipment Recommendations  3in1 (PT);Other (comment) (rollator)    Recommendations for Other Services       Precautions / Restrictions Precautions Precautions: Fall;Other (comment) Precaution Comments: Impella, LIJ CVC Restrictions Weight Bearing Restrictions: Yes RUE Weight Bearing: Partial weight bearing RUE Partial Weight Bearing Percentage or Pounds: no movement above shoulder     Mobility  Bed Mobility Overal bed mobility: Needs Assistance Bed Mobility: Supine to Sit;Sit to Supine     Supine to sit: Min guard Sit to supine: Min guard   General bed mobility comments: minG with assist for line management only, increased time    Transfers Overall transfer level: Needs assistance Equipment used: Rolling walker (2 wheeled) Transfers: Sit to/from  Stand Sit to Stand: Min guard;+2 safety/equipment         General transfer comment: minG with good stability  Ambulation/Gait Ambulation/Gait assistance: Supervision;+2 safety/equipment Gait Distance (Feet): 370 Feet Assistive device: Rolling walker (2 wheeled) Gait Pattern/deviations: Step-through pattern;Decreased stride length;Trunk flexed Gait velocity: 0.44 m/x Gait velocity interpretation: 1.31 - 2.62 ft/sec, indicative of limited community ambulator General Gait Details: slow but steady, no overt LOB or need for physical assist. no need for standing rest break but pt declining mobility progression due to fatigue     Balance Overall balance assessment: Needs assistance   Sitting balance-Leahy Scale: Good       Standing balance-Leahy Scale: Fair Standing balance comment: can static stand without UE support; static and dynamic stability improved with walker                            Cognition Arousal/Alertness: Awake/alert Behavior During Therapy: Flat affect Overall Cognitive Status: Within Functional Limits for tasks assessed                                 General Comments: Feeling better since last therapy session, continues to be motivated      Exercises      General Comments General comments (skin integrity, edema, etc.): BP 92/81 (86) prior to session; 70/61 (66) after ambulation      Pertinent Vitals/Pain Pain Assessment: Faces Faces Pain Scale: Hurts a little bit Pain Location: Impella site, generalized Pain Descriptors / Indicators: Guarding;Tiring;Discomfort Pain Intervention(s): Limited activity within patient's tolerance;Monitored during session;Repositioned     PT Goals (current goals  can now be found in the care plan section) Acute Rehab PT Goals Patient Stated Goal: Just get back on my feet PT Goal Formulation: With patient Time For Goal Achievement: 01/05/21 Potential to Achieve Goals: Good Progress towards PT  goals: Progressing toward goals    Frequency    Min 3X/week      PT Plan Current plan remains appropriate       AM-PAC PT "6 Clicks" Mobility   Outcome Measure  Help needed turning from your back to your side while in a flat bed without using bedrails?: None Help needed moving from lying on your back to sitting on the side of a flat bed without using bedrails?: A Little Help needed moving to and from a bed to a chair (including a wheelchair)?: A Little Help needed standing up from a chair using your arms (e.g., wheelchair or bedside chair)?: A Little Help needed to walk in hospital room?: A Little Help needed climbing 3-5 steps with a railing? : A Lot 6 Click Score: 18    End of Session Equipment Utilized During Treatment: Gait belt Activity Tolerance: Patient limited by fatigue Patient left: with call bell/phone within reach;in bed Nurse Communication: Mobility status PT Visit Diagnosis: Unsteadiness on feet (R26.81);Difficulty in walking, not elsewhere classified (R26.2)     Time: 6720-9470 PT Time Calculation (min) (ACUTE ONLY): 20 min  Charges:  $Gait Training: 8-22 mins                     Vickki Muff, PT, DPT   Acute Rehabilitation Department Pager #: [phone]   Ronnie Derby 12/30/2020, 5:30 PM

## 2020-12-30 NOTE — Plan of Care (Signed)
  Problem: Cardiac: Goal: Ability to achieve and maintain adequate cardiopulmonary perfusion will improve Outcome: Not Progressing  Continuing to increase inotropic support Problem: Education: Goal: Ability to verbalize understanding of medication therapies will improve Outcome: Progressing

## 2020-12-30 NOTE — Progress Notes (Signed)
6 Days Post-Op Procedure(s) (LRB): EVACUATION HEMATOMA (N/A) Subjective: Feels better this evening  Objective: Vital signs in last 24 hours: Temp:  [97.6 F (36.4 C)-98 F (36.7 C)] 97.6 F (36.4 C) (10/20 1550) Pulse Rate:  [89-96] 96 (10/20 1800) Cardiac Rhythm: Normal sinus rhythm (10/20 1550) Resp:  [11-22] 20 (10/20 1800) BP: (63-117)/(25-91) 117/71 (10/20 1800) SpO2:  [94 %-100 %] 96 % (10/20 1800) Weight:  [93.5 kg] 93.5 kg (10/20 0611)  Hemodynamic parameters for last 24 hours: CVP:  [9 mmHg-20 mmHg] 17 mmHg  Intake/Output from previous day: 10/19 0701 - 10/20 0700 In: 3605.8 [P.O.:1080; I.V.:2216.1; IV Piggyback:34.5] Out: 3325 [Urine:3325] Intake/Output this shift: Total I/O In: 1811.1 [P.O.:600; I.V.:1073.5; Other:125.4; IV Piggyback:12.2] Out: 1450 [Urine:1450]  General appearance: alert, cooperative, and no distress Neurologic: intact Wound: clean and dry  Lab Results: Recent Labs    12/29/20 0437 12/30/20 0233  WBC 10.8* 11.0*  HGB 9.7* 9.8*  HCT 27.7* 28.4*  PLT 160 177   BMET:  Recent Labs    12/29/20 1659 12/30/20 0233  NA 125* 126*  K 4.1 3.9  CL 88* 88*  CO2 28 25  GLUCOSE 166* 154*  BUN 23* 24*  CREATININE 1.27* 1.28*  CALCIUM 8.6* 8.7*    PT/INR: No results for input(s): LABPROT, INR in the last 72 hours. ABG    Component Value Date/Time   PHART 7.470 (H) 12/23/2020 0435   HCO3 28.7 (H) 12/23/2020 0435   TCO2 30 12/24/2020 1744   ACIDBASEDEF 2.0 12/22/2020 0415   O2SAT 55.5 12/30/2020 0233   CBG (last 3)  Recent Labs    12/30/20 0649 12/30/20 1202 12/30/20 1554  GLUCAP 165* 176* 219*    Assessment/Plan: S/P Procedure(s) (LRB): EVACUATION HEMATOMA (N/A) - Feels better this evening Nausea improved, just had BM Impella still at p9 with flows of 5L On epi 3, milrinone 0.5, norepi 28   LOS: 14 days    Loreli Slot 12/30/2020

## 2020-12-30 NOTE — Progress Notes (Signed)
Dr. Gala Romney at bedside. May titrate levo to keep MAPs on impella >70.

## 2020-12-30 NOTE — Progress Notes (Addendum)
Patient ID: Dylan Chaney, male   DOB: [birth year], 50 y.o.   MRN: [id number]    Advanced Heart Failure Rounding Note   Subjective:    - 10/11 Impella 5.5 placed - 12/22/20 Impllea turned up to P9 and milrinone increased to 0.5 mcg. Given dose of tolvaptan.  - 10/14 To OR for Impella site hematoma washout.   Continues on Impella at P-9 Flow 5.0   On Epi 2, Milrinone 0.5, NE up to 20. Co-ox 56% (stable)   CI by Fick Calculation 2.24L/min2   3.3L in UOP yesterday but net +280 cc. Wt up 2 lb. CVP 15.   Scr 1.28 K 3.9  Na 126   LDH 304>>289>>312>>323>>338>>359  Plts stable at 177   Impella site/hematoma stable.   He feels nauseated today, just asked for zofran. No abdominal pain. Denies dyspnea.   Objective:   Weight Range:  Vital Signs:   Temp:  [98 F (36.7 C)-98.7 F (37.1 C)] 98 F (36.7 C) (10/19 1900) Pulse Rate:  [74-97] 95 (10/20 0718) Resp:  [9-24] 20 (10/20 0718) BP: (53-108)/(31-90) 108/70 (10/20 0718) SpO2:  [94 %-100 %] 96 % (10/20 0718) Weight:  [93.5 kg] 93.5 kg (10/20 0611) Last BM Date: 12/29/20  Weight change: Filed Weights   12/28/20 0700 12/29/20 0600 12/30/20 0611  Weight: 94.5 kg 92.7 kg 93.5 kg    Intake/Output:   Intake/Output Summary (Last 24 hours) at 12/30/2020 0744 Last data filed at 12/30/2020 0700 Gross per 24 hour  Intake 3365.8 ml  Output 3325 ml  Net 40.8 ml   PHYSICAL EXAM: CVP 15  General:  fatigued appearing, sitting up in chair. No respiratory difficulty HEENT: normal Neck: supple. JVD 14 cm. + left IJ CVC Carotids 2+ bilat; no bruits. No lymphadenopathy or thyromegaly appreciated. Cor: PMI nondisplaced. Regular rate & rhythm. No rubs, gallops or murmurs. Lungs: clear Abdomen: soft, nontender, nondistended. No hepatosplenomegaly. No bruits or masses. Good bowel sounds. Extremities: no cyanosis, clubbing, rash, 2+ bilateral LE edema + unna boots + Rt axillary impella site stable, no active bleeding Neuro: alert &  oriented x 3, cranial nerves grossly intact. moves all 4 extremities w/o difficulty. Affect pleasant.    Telemetry:  NSR 90s, no further NSVT  (personally reviewed)   Labs: Basic Metabolic Panel: Recent Labs  Lab 12/24/20 0100 12/24/20 1744 12/27/20 0341 12/28/20 0357 12/29/20 0437 12/29/20 1659 12/30/20 0233  NA 132*   < > 127* 127* 125* 125* 126*  K 4.0   < > 3.6 4.2 3.7 4.1 3.9  CL 95*   < > 92* 93* 88* 88* 88*  CO2 28   < > 27 26 27 28 25   GLUCOSE 164*   < > 189* 165* 163* 166* 154*  BUN 16   < > 20 22* 20 23* 24*  CREATININE 1.05   < > 1.11 1.10 1.17 1.27* 1.28*  CALCIUM 8.4*   < > 8.2* 8.5* 8.7* 8.6* 8.7*  MG 2.0  --  1.9  --   --   --  1.9   < > = values in this interval not displayed.    Liver Function Tests: No results for input(s): AST, ALT, ALKPHOS, BILITOT, PROT, ALBUMIN in the last 168 hours.  No results for input(s): LIPASE, AMYLASE in the last 168 hours. No results for input(s): AMMONIA in the last 168 hours.  CBC: Recent Labs  Lab 12/26/20 1914 12/27/20 0341 12/28/20 0357 12/29/20 0437 12/30/20 0233  WBC 11.6* 11.3* 10.5  10.8* 11.0*  HGB 10.1* 9.5* 10.0* 9.7* 9.8*  HCT 29.5* 27.8* 29.1* 27.7* 28.4*  MCV 81.9 82.2 81.1 80.1 80.9  PLT 143* 129* 142* 160 177    Cardiac Enzymes: No results for input(s): CKTOTAL, CKMB, CKMBINDEX, TROPONINI in the last 168 hours.   BNP: BNP (last 3 results) Recent Labs    12/15/20 1716 12/17/20 0457  BNP 2,024.7* 1,742.2*    ProBNP (last 3 results) No results for input(s): PROBNP in the last 8760 hours.    Other results:  Imaging: DG Abd 1 View  Result Date: 12/29/2020 CLINICAL DATA:  Evaluate for possible ileus, initial encounter EXAM: ABDOMEN - 1 VIEW COMPARISON:  12/20/2020 FINDINGS: Scattered large and small bowel gas is noted. No significant dilatation is seen. No free air is noted. No abnormal mass or abnormal calcifications are noted. Bony structures are within normal limits. Impella device is  noted in the chest but incompletely evaluated. IMPRESSION: No findings to suggest ileus or obstruction are seen. Electronically Signed   By: Alcide Clever M.D.   On: 12/29/2020 00:12     Medications:     Scheduled Medications:  (feeding supplement) PROSource Plus  30 mL Oral TID BM   aspirin EC  81 mg Oral Daily   Chlorhexidine Gluconate Cloth  6 each Topical Daily   digoxin  0.125 mg Oral Daily   docusate sodium  100 mg Oral BID   feeding supplement  1 Container Oral TID BM   insulin aspart  0-15 Units Subcutaneous TID WC   insulin aspart  0-5 Units Subcutaneous QHS   insulin aspart  3 Units Subcutaneous TID WC   insulin glargine-yfgn  20 Units Subcutaneous Daily   metoCLOPramide (REGLAN) injection  10 mg Intravenous Q8H   multivitamin with minerals  1 tablet Oral Daily   pantoprazole  40 mg Oral Daily   polyethylene glycol  17 g Oral Daily   tamsulosin  0.4 mg Oral QPC supper    Infusions:  sodium chloride 10 mL/hr at 12/30/20 0700   amiodarone 60 mg/hr (12/30/20 0700)   epinephrine 2 mcg/min (12/30/20 0700)   heparin 50 units/mL (Impella PURGE) in dextrose 5 % 1000 mL bag 10.9 mL/hr at 12/21/20 1100   heparin 600 Units/hr (12/30/20 0700)   milrinone 0.5 mcg/kg/min (12/30/20 0700)   norepinephrine (LEVOPHED) Adult infusion 28 mcg/min (12/30/20 0700)    PRN Medications: sodium chloride, acetaminophen, bisacodyl, lip balm, ondansetron **OR** ondansetron (ZOFRAN) IV, oxyCODONE, sodium chloride flush, traMADol   Assessment/Plan:   1. 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 Will need cath +/- cMRI when more stable - Impella 5.5 placed 10/11.  Speed increased to P9. Would continue current speed. Clear urine.  LDH up 289>>312>>323>>338>>359.  - Remains on  milrinone 0.5 mcg + norepi 20 mcg + Epi 2. CO-OX marginal 56%. CI 2.2 by Fick calculation   - Remains very tenuous despite current support, May need Protek Duo for RV support. - CVP 15. Net + yesterday. Increase Lasix to 80 mg bid today. Careful about lowering CVP too aggressively with RV failure.  - Continue digoxin 0.125, level ok - Given social situation advanced therapies would be challenging but if he does not recover with medical therapy, he has extensive support from  [employer]. We will work to get him Medicaid to keep transplant option on the table for him. Not VAD candidate with severe RV dysfunction - HCV quant is negative  - Blood type B+ - Dr Gala Romney spoke to Dr. Edwena Blow at [facility]. They would consider him for transplant evaluation (will need Medicaid first).    2. Acute PE  - CTA + bilateral segmental/subsegmental PE likely due to low output - Heparin restarted last night (post-hematoma evacuation).  - PLTs 79>74k>94>120>127>129>142>160>177  - HIT panel negative.  - SRA negative   3. Acute blood loss anemia - bleeding from Impella site on 10/11 => heparin stopped 10/14 and patient had hematoma evacuation by Dr. Dorris Fetch => heparin restarted.    - On heparin gtt for PE + Impella  - Hgb stable 9.8 today. Monitor   4. Shock liver - improving with hemodynamic support   5. AKI on CKD 3B - baseline SCr 1.8-2.0 - due to ATN from shock  - Creatinine normalized.   6. Thrombocytopenia - Platelets starting to increase, up to 177 today.  - HIT panel negative.  - SRA negative    - Doubt significant hemolysis at this point (stable LDH and rising plts), no evidence for HIT.    7. HCV - viral RNA Quant negative   8. 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 Hypervolemic Hyponatremia - 10/12, 10/14 Tolvaptan  - Na 126 today - Diurese w/ IV Lasix - Fluid restrict.    10. NSVT - less ectopy w/ amio gtt increase, continue at 60/hr  - continue amio gtt while on dual  inotropes - keep K > 4.0 and Mg >2.0   11. Constipation  - Resolved w/ reglan and sorbitol  - KUB 10/18 negative  12. [facility] needs - Dr Gala Romney spoke with his caseworker April Anders [phone]). He was incarcerated almost a decade ago but not since. He has been homeless for years but recently moved into a hotel in June through the [facility]. 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 No substance use. Always first to volunteer to help other [employer] members move and do other tasks. [employer] members are very attached to him and said they would drive him back and forth to [facility] for transplant eval,if needed. Other [employer] contact: Brother John: [phone]. His sister and his mother have been in to see and support him.   Await Medicaid. Once he has insurance consider transfer to [facility] for transplant eval.    Robbie Lis, PA-C  7:44 AM  Agree with above.   Remains on Epi 3. NE 3- and milrinone 0.5. Co-ox 56% CVP elevated. On Impella 5.5 at P-9 (waveforms ok). Was able to walk halls 2x/day  General:  Fatigued appearing. No resp difficulty HEENT: normal Neck: supple. JVP to jaw   Cor: impella site ok PMI nondisplaced. Regular rate & rhythm. No rubs, gallops or murmurs. Lungs: clear Abdomen: soft, nontender, nondistended. No hepatosplenomegaly. No bruits or masses. Good bowel sounds. Extremities: no cyanosis, clubbing, rash, 1+ edema Neuro: alert & orientedx3, cranial nerves grossly intact. moves all 4 extremities w/o difficulty. Affect pleasant  Remains critically ill continue current support. He does not want RV mechanical support device. Hopefully we can continue to bridge him. Wean NE as tolerated. Continue diuresis. F/u Medicaid  status in am.   CRITICAL CARE Performed by: Arvilla Meres  Total critical care time: 35  minutes  Critical care time was exclusive of separately billable procedures and treating other patients.  Critical care was necessary to treat or prevent imminent or life-threatening deterioration.  Critical care was time spent personally by me (independent of midlevel providers or residents) on the following activities: development of treatment plan with patient and/or surrogate as well as nursing, discussions with consultants, evaluation of patient's response to treatment, examination of patient, obtaining history from patient or surrogate, ordering and performing treatments and interventions, ordering and review of laboratory studies, ordering and review of radiographic studies, pulse oximetry and re-evaluation of patient's condition.  Arvilla Meres, MD  5:49 PM

## 2020-12-30 NOTE — Progress Notes (Signed)
Occupational Therapy Treatment Patient Details Name: Dylan Chaney MRN: [id number] DOB: [birth year] Today's Date: 12/30/2020   History of present illness Pt is a 50 y.o. male admitted 12/17/20 with chest pain, SOB. Chest CT with bilateral PE. Echo showed EF < 20%. Decision made for mechanical support as possible bridge to transplant. Now s/p placement of Impella left ventricular assist device 10/11. S/p impella hematoma evacuation 10/15. PMH includes remote drug and alcohol abuse.   OT comments  Progressing towards OT goals today. Pt offered to practice strategies for bathing, preffered to walk. Pt performed a lap of the entire unit at min guard with EVA walker. RN present for impella management. VSS throughout session. Pt did participate in standing for urination and then seated hand washing with set up post-ambulation. Mother present during session today and very encouraging/supportive. OT will continue to follow acutely.    Recommendations for follow up therapy are one component of a multi-disciplinary discharge planning process, led by the attending physician.  Recommendations may be updated based on patient status, additional functional criteria and insurance authorization.    Follow Up Recommendations  No OT follow up    Equipment Recommendations  None recommended by OT    Recommendations for Other Services      Precautions / Restrictions Precautions Precautions: Fall;Other (comment) Precaution Comments: Impella, LIJ CVC Restrictions Weight Bearing Restrictions: Yes RUE Weight Bearing: Partial weight bearing RUE Partial Weight Bearing Percentage or Pounds: no movement above shoulder       Mobility Bed Mobility               General bed mobility comments: OOB in recliner at beginning and end of session    Transfers Overall transfer level: Needs assistance Equipment used: Bilateral platform walker (EVA walker) Transfers: Sit to/from Stand Sit to Stand: Min guard;+2  safety/equipment         General transfer comment: Reliant on momentum to power into standing, supervision for safety/lines; +2 line/impella management    Balance Overall balance assessment: Needs assistance   Sitting balance-Leahy Scale: Good       Standing balance-Leahy Scale: Fair Standing balance comment: can static stand without UE support; static and dynamic stability improved with walker                           ADL either performed or assessed with clinical judgement   ADL Overall ADL's : Needs assistance/impaired     Grooming: Wash/dry hands;Wash/dry face;Set up;Sitting Grooming Details (indicate cue type and reason): in recliner                 Toilet Transfer: Minimal assistance;+2 for safety/equipment;Ambulation Toilet Transfer Details (indicate cue type and reason): EVA walker Toileting- Clothing Manipulation and Hygiene: Min guard;Sit to/from stand Toileting - Clothing Manipulation Details (indicate cue type and reason): front peri care     Functional mobility during ADLs: Min guard;+2 for safety/equipment (EVA walker) General ADL Comments: Plans to participate in his bath later today     Vision       Perception     Praxis      Cognition Arousal/Alertness: Awake/alert Behavior During Therapy: Flat affect Overall Cognitive Status: Within Functional Limits for tasks assessed                                 General Comments: Feeling better since last therapy session, continues to be  motivated        Exercises     Shoulder Instructions       General Comments Mother present during session, very encouraging    Pertinent Vitals/ Pain       Pain Assessment: Faces Faces Pain Scale: Hurts a little bit Pain Location: Impella site, generalized Pain Descriptors / Indicators: Guarding;Tiring;Discomfort Pain Intervention(s): Monitored during session;Repositioned;Premedicated before session  Home Living                                           Prior Functioning/Environment              Frequency  Min 2X/week        Progress Toward Goals  OT Goals(current goals can now be found in the care plan section)  Progress towards OT goals: Progressing toward goals  Acute Rehab OT Goals Patient Stated Goal: Just get back on my feet OT Goal Formulation: With patient Time For Goal Achievement: 01/06/21 Potential to Achieve Goals: Good  Plan Discharge plan remains appropriate;Frequency remains appropriate;Equipment recommendations need to be updated    Co-evaluation                 AM-PAC OT "6 Clicks" Daily Activity     Outcome Measure   Help from another person eating meals?: None Help from another person taking care of personal grooming?: None Help from another person toileting, which includes using toliet, bedpan, or urinal?: A Little Help from another person bathing (including washing, rinsing, drying)?: A Little Help from another person to put on and taking off regular upper body clothing?: A Little Help from another person to put on and taking off regular lower body clothing?: A Little 6 Click Score: 20    End of Session Equipment Utilized During Treatment:  Fara Boros)  OT Visit Diagnosis: Unsteadiness on feet (R26.81);Pain Pain - Right/Left: Right Pain - part of body: Arm   Activity Tolerance Patient tolerated treatment well   Patient Left in chair;with call bell/phone within reach;with nursing/sitter in room   Nurse Communication Mobility status        Time: 7829-5621 OT Time Calculation (min): 35 min  Charges: OT General Charges $OT Visit: 1 Visit OT Treatments $Self Care/Home Management : 8-22 mins $Therapeutic Activity: 8-22 mins  Nyoka Cowden OTR/L Acute Rehabilitation Services Pager: [phone] Office: [phone]  Evern Bio Briceyda Abdullah 12/30/2020, 12:57 PM

## 2020-12-31 LAB — LACTATE DEHYDROGENASE: LDH: 397 U/L — ABNORMAL HIGH (ref 98–192)

## 2020-12-31 LAB — GLUCOSE, CAPILLARY
Glucose-Capillary: 179 mg/dL — ABNORMAL HIGH (ref 70–99)
Glucose-Capillary: 227 mg/dL — ABNORMAL HIGH (ref 70–99)
Glucose-Capillary: 187 mg/dL — ABNORMAL HIGH (ref 70–99)
Glucose-Capillary: 125 mg/dL — ABNORMAL HIGH (ref 70–99)

## 2020-12-31 LAB — COOXEMETRY PANEL
Carboxyhemoglobin: 1.3 % (ref 0.5–1.5)
Carboxyhemoglobin: 1.5 % (ref 0.5–1.5)
Carboxyhemoglobin: 1.3 % (ref 0.5–1.5)
Methemoglobin: 0.7 % (ref 0.0–1.5)
Methemoglobin: 0.9 % (ref 0.0–1.5)
Methemoglobin: 0.9 % (ref 0.0–1.5)
O2 Saturation: 38.7 %
O2 Saturation: 48.7 %
O2 Saturation: 45.4 %
Total hemoglobin: 10.3 g/dL — ABNORMAL LOW (ref 12.0–16.0)
Total hemoglobin: 10.3 g/dL — ABNORMAL LOW (ref 12.0–16.0)
Total hemoglobin: 12.5 g/dL (ref 12.0–16.0)

## 2020-12-31 LAB — CBC
HCT: 28.8 % — ABNORMAL LOW (ref 39.0–52.0)
Hemoglobin: 9.7 g/dL — ABNORMAL LOW (ref 13.0–17.0)
MCH: 27.5 pg (ref 26.0–34.0)
MCHC: 33.7 g/dL (ref 30.0–36.0)
MCV: 81.6 fL (ref 80.0–100.0)
Platelets: 185 10*3/uL (ref 150–400)
RBC: 3.53 MIL/uL — ABNORMAL LOW (ref 4.22–5.81)
RDW: 13.9 % (ref 11.5–15.5)
WBC: 11.5 10*3/uL — ABNORMAL HIGH (ref 4.0–10.5)
nRBC: 0.2 % (ref 0.0–0.2)

## 2020-12-31 LAB — BASIC METABOLIC PANEL
Anion gap: 11 (ref 5–15)
BUN: 23 mg/dL — ABNORMAL HIGH (ref 6–20)
CO2: 25 mmol/L (ref 22–32)
Calcium: 8.5 mg/dL — ABNORMAL LOW (ref 8.9–10.3)
Chloride: 89 mmol/L — ABNORMAL LOW (ref 98–111)
Creatinine, Ser: 1.29 mg/dL — ABNORMAL HIGH (ref 0.61–1.24)
GFR, Estimated: >60 mL/min (ref >60)
Glucose, Bld: 181 mg/dL — ABNORMAL HIGH (ref 70–99)
Potassium: 3.6 mmol/L (ref 3.5–5.1)
Sodium: 125 mmol/L — ABNORMAL LOW (ref 135–145)

## 2020-12-31 LAB — LACTIC ACID, PLASMA: Lactic Acid, Venous: 2.2 mmol/L (ref 0.5–1.9)

## 2020-12-31 LAB — HEPARIN LEVEL (UNFRACTIONATED): Heparin Unfractionated: <0.1 IU/mL — ABNORMAL LOW (ref 0.30–0.70)

## 2020-12-31 LAB — MAGNESIUM: Magnesium: 2.3 mg/dL (ref 1.7–2.4)

## 2020-12-31 MED ORDER — SODIUM CHLORIDE 0.9% FLUSH
10.0000 mL | Freq: Two times a day (BID) | INTRAVENOUS | Status: DC
Start: 2020-12-31 — End: 2021-01-06
  Administered 2020-12-31: 40 mL
  Administered 2020-12-31 – 2021-01-01 (×3): 10 mL
  Administered 2021-01-01: 40 mL
  Administered 2021-01-02: 10 mL
  Administered 2021-01-02: 40 mL
  Administered 2021-01-03 – 2021-01-05 (×3): 10 mL

## 2020-12-31 MED ORDER — SODIUM CHLORIDE 0.9% FLUSH: 10.0000 mL | INTRAVENOUS | Status: DC | PRN

## 2020-12-31 MED ORDER — POTASSIUM CHLORIDE CRYS ER 20 MEQ PO TBCR
40.0000 meq | EXTENDED_RELEASE_TABLET | Freq: Once | ORAL | Status: AC
  Administered 2020-12-31: 40 meq via ORAL
  Filled 2020-12-31: qty 2

## 2020-12-31 MED ORDER — METOLAZONE 5 MG PO TABS
5.0000 mg | ORAL_TABLET | Freq: Once | ORAL | Status: AC
  Administered 2020-12-31: 5 mg via ORAL
  Filled 2020-12-31: qty 1

## 2020-12-31 NOTE — Progress Notes (Signed)
Orthopedic Tech Progress Note Patient Details:  Reason Helzer Aug 26, 1970 [id number]  Ortho Devices Type of Ortho Device: Radio broadcast assistant Ortho Device/Splint Location: BLE Ortho Device/Splint Interventions: Ordered, Application   Post Interventions Patient Tolerated: Well Instructions Provided: Care of device  Randell Detter A Meilah Delrosario 12/31/2020, 9:41 AM

## 2020-12-31 NOTE — Progress Notes (Signed)
CSW received communication from [facility] that patient's packet has arrived at [facility] and awaiting assignment to a worker. CSW will be notified of worker name and contact information for further communication.   CSW received call from [facility] social worker [phone] requesting contact information of patient's caregiver. CSW shared with patient's permission his sisters name and contact information. [facility] CSW will contact sister to discuss caregiver and plan for housing and support post discharge.  Patient states he is feeling mentally and physically drained today. CSW provided support and offered added support although patient states "if I could just sleep it might help". CSW provided supportive intervention and continues to follow for assistance with Medicaid and support as needed. Lasandra Beech, LCSW, CCSW-MCS [phone]

## 2020-12-31 NOTE — Plan of Care (Signed)
Stable during shift. Remains on Impella, P9, no suction alarms.  Flow 4.7-5, minimal pulsatility. On epi @ 3, levo gtt titrated for MAP on Impella (NIBP inaccurate/no reading). Impella site stable with scant ooze. Warm and dry, doppler pulses (LE pulses faint/difficult to find). Heparin in purge system and systemic, Xa managed by HF.   Milrinone gtt @ 0.5. Coox this am low 38.7, sending repeat now.   NSR 80-90s, 1 x 6 beat run of WCT ealry last night, occasional PVC/PAC. Amio gtt @ 60.   RA. Diuresing, voided 3.6 L/24 hr. Wt up 0.4 kg. Edema controlled with unna boots. CVP 19-17  Na 125, on 1800 ml fluid restrictions. Poor appetite, encouraging nutritional supplements. Denies nausea, scopolamine patch in place. BM yesterday. No need for pain meds. OOB to recliner this am, strong, steady gait.   Difficulty sleeping, possibly developing delirium as pt mentioned seeing movement in periphery but denied hallucinations. Clustering care.    Problem: Education: Goal: Knowledge of General Education information will improve Description: Including pain rating scale, medication(s)/side effects and non-pharmacologic comfort measures Outcome: Progressing   Problem: Clinical Measurements: Goal: Ability to maintain clinical measurements within normal limits will improve Outcome: Progressing Goal: Will remain free from infection Outcome: Progressing Goal: Respiratory complications will improve Outcome: Progressing Goal: Cardiovascular complication will be avoided Outcome: Progressing   Problem: Activity: Goal: Risk for activity intolerance will decrease Outcome: Progressing   Problem: Coping: Goal: Level of anxiety will decrease Outcome: Progressing   Problem: Elimination: Goal: Will not experience complications related to bowel motility Outcome: Progressing Goal: Will not experience complications related to urinary retention Outcome: Progressing   Problem: Pain Managment: Goal: General  experience of comfort will improve Outcome: Progressing   Problem: Safety: Goal: Ability to remain free from injury will improve Outcome: Progressing   Problem: Skin Integrity: Goal: Risk for impaired skin integrity will decrease Outcome: Progressing   Problem: Nutrition: Goal: Adequate nutrition will be maintained Outcome: Not Progressing

## 2020-12-31 NOTE — Progress Notes (Signed)
Physical Therapy Treatment Patient Details Name: Dylan Chaney MRN: [id number] DOB: [birth year] Today's Date: 12/31/2020   History of Present Illness Pt is a 50 y.o. male admitted 12/17/20 with chest pain, SOB. Chest CT with bilateral PE. Echo showed EF < 20%. Decision made for mechanical support as possible bridge to transplant. Now s/p placement of Impella left ventricular assist device 10/11. S/p impella hematoma evacuation 10/15. PMH includes remote drug and alcohol abuse.   PT Comments    Pt progressing with mobility. Today's session focused on gait training with rollator, as well as ambulation for improving activity tolerance and strength; pt moving well with supervision for safety/lines. Pt remains limited by generalized weakness, decreased activity tolerance. Plan to progress BLE therex next session. Will continue to follow acutely to address established goals.    Recommendations for follow up therapy are one component of a multi-disciplinary discharge planning process, led by the attending physician.  Recommendations may be updated based on patient status, additional functional criteria and insurance authorization.  Follow Up Recommendations  Home health PT;Supervision - Intermittent     Equipment Recommendations  Rollator; 3in1   Recommendations for Other Services       Precautions / Restrictions Precautions Precautions: Fall;Other (comment) Precaution Comments: Impella, LIJ CVC     Mobility  Bed Mobility Overal bed mobility: Independent Bed Mobility: Sit to Supine           General bed mobility comments: Independent for return to supine with bed flat, only requiring assist to manage lines    Transfers Overall transfer level: Modified independent Equipment used: 4-wheeled walker Transfers: Sit to/from Stand Sit to Stand: Modified independent (Device/Increase time);+2 safety/equipment         General transfer comment: Initial cues to lock rollator brakes  prior to standing/sitting, good carryover  Ambulation/Gait Ambulation/Gait assistance: Supervision;+2 safety/equipment Gait Distance (Feet): 390 Feet Assistive device: 4-wheeled walker Gait Pattern/deviations: Step-through pattern;Decreased stride length;Trunk flexed     General Gait Details: Slow, steady gait with rollator, supervision for lines; good gait speed, actually requiring cues to slow down to allow RN/PT to keep up with lines; improved ability to maintain upright posture   Stairs             Wheelchair Mobility    Modified Rankin (Stroke Patients Only)       Balance Overall balance assessment: Needs assistance Sitting-balance support: Feet supported Sitting balance-Leahy Scale: Good       Standing balance-Leahy Scale: Fair Standing balance comment: can static stand without UE support; static and dynamic stability improved with walker                            Cognition Arousal/Alertness: Awake/alert Behavior During Therapy: Flat affect Overall Cognitive Status: Within Functional Limits for tasks assessed                                        Exercises Other Exercises Other Exercises: pt declined secondary to fatigue post-ambulation, interested to progress therex next session    General Comments        Pertinent Vitals/Pain Pain Assessment: No/denies pain Pain Intervention(s): Monitored during session    Home Living                      Prior Function  PT Goals (current goals can now be found in the care plan section) Progress towards PT goals: Progressing toward goals    Frequency    Min 3X/week      PT Plan Current plan remains appropriate    Co-evaluation              AM-PAC PT "6 Clicks" Mobility   Outcome Measure  Help needed turning from your back to your side while in a flat bed without using bedrails?: None Help needed moving from lying on your back to sitting on  the side of a flat bed without using bedrails?: None Help needed moving to and from a bed to a chair (including a wheelchair)?: None Help needed standing up from a chair using your arms (e.g., wheelchair or bedside chair)?: None Help needed to walk in hospital room?: A Little Help needed climbing 3-5 steps with a railing? : A Little 6 Click Score: 22    End of Session   Activity Tolerance: Patient tolerated treatment well Patient left: in bed;with call bell/phone within reach;with nursing/sitter in room Nurse Communication: Mobility status PT Visit Diagnosis: Unsteadiness on feet (R26.81);Difficulty in walking, not elsewhere classified (R26.2)     Time: 2703-5009 PT Time Calculation (min) (ACUTE ONLY): 27 min  Charges:  $Gait Training: 8-22 mins $Therapeutic Exercise: 8-22 mins                     Ina Homes, PT, DPT Acute Rehabilitation Services  Pager [phone] Office [phone]  Malachy Chamber 12/31/2020, 2:38 PM

## 2020-12-31 NOTE — Progress Notes (Signed)
Patient ID: Dylan Chaney, male   DOB: [birth year], 50 y.o.   MRN: [id number]    Advanced Heart Failure Rounding Note   Subjective:    - 10/11 Impella 5.5 placed - 12/22/20 Impllea turned up to P9 and milrinone increased to 0.5 mcg. Given dose of tolvaptan.  - 10/14 To OR for Impella site hematoma washout.   Continues on Impella at P-9 Flow 5.0   On Epi 2, Milrinone 0.5, NE at 26. Co-ox down today 39% early am 49% recehck  Says he had BM yesterday and feels much better. CVP remains high ~22  Ab pain better. Denies SOB. On heparin.   Impella flows/waveforms stable   Objective:   Weight Range:  Vital Signs:   Temp:  [97.4 F (36.3 C)-99 F (37.2 C)] 99 F (37.2 C) (10/21 0700) Pulse Rate:  [78-100] 87 (10/21 1130) Resp:  [11-25] 18 (10/21 1130) BP: (63-117)/(25-81) 93/58 (10/21 0000) SpO2:  [94 %-100 %] 99 % (10/21 1130) Weight:  [93.7 kg] 93.7 kg (10/21 0600) Last BM Date: 12/30/20  Weight change: Filed Weights   12/29/20 0600 12/30/20 0611 12/31/20 0600  Weight: 92.7 kg 93.5 kg 93.7 kg    Intake/Output:   Intake/Output Summary (Last 24 hours) at 12/31/2020 1147 Last data filed at 12/31/2020 1100 Gross per 24 hour  Intake 4150.43 ml  Output 3450 ml  Net 700.43 ml    PHYSICAL EXAM: CVP 22 General:  fatigued appearing, sitting up in chair. No respiratory difficulty HEENT: normal Neck: supple. JVD 14 cm. + left IJ CVC  Cor: Impella hum Regular rate & rhythm. No rubs, gallops or murmurs. Lungs: clear Abdomen: soft, nontender, nondistended. No hepatosplenomegaly. No bruits or masses. Good bowel sounds. Extremities: no cyanosis, clubbing, rash, 2+ edema Neuro: alert & orientedx3, cranial nerves grossly intact. moves all 4 extremities w/o difficulty. Affect pleasant  Telemetry:  NSR 90 Personally reviewed   Labs: Basic Metabolic Panel: Recent Labs  Lab 12/27/20 0341 12/28/20 0357 12/29/20 0437 12/29/20 1659 12/30/20 0233 12/31/20 0311  NA 127*  127* 125* 125* 126* 125*  K 3.6 4.2 3.7 4.1 3.9 3.6  CL 92* 93* 88* 88* 88* 89*  CO2 27 26 27 28 25 25   GLUCOSE 189* 165* 163* 166* 154* 181*  BUN 20 22* 20 23* 24* 23*  CREATININE 1.11 1.10 1.17 1.27* 1.28* 1.29*  CALCIUM 8.2* 8.5* 8.7* 8.6* 8.7* 8.5*  MG 1.9  --   --   --  1.9 2.3     Liver Function Tests: No results for input(s): AST, ALT, ALKPHOS, BILITOT, PROT, ALBUMIN in the last 168 hours.  No results for input(s): LIPASE, AMYLASE in the last 168 hours. No results for input(s): AMMONIA in the last 168 hours.  CBC: Recent Labs  Lab 12/27/20 0341 12/28/20 0357 12/29/20 0437 12/30/20 0233 12/31/20 0311  WBC 11.3* 10.5 10.8* 11.0* 11.5*  HGB 9.5* 10.0* 9.7* 9.8* 9.7*  HCT 27.8* 29.1* 27.7* 28.4* 28.8*  MCV 82.2 81.1 80.1 80.9 81.6  PLT 129* 142* 160 177 185     Cardiac Enzymes: No results for input(s): CKTOTAL, CKMB, CKMBINDEX, TROPONINI in the last 168 hours.   BNP: BNP (last 3 results) Recent Labs    12/15/20 1716 12/17/20 0457  BNP 2,024.7* 1,742.2*     ProBNP (last 3 results) No results for input(s): PROBNP in the last 8760 hours.    Other results:  Imaging: No results found.   Medications:     Scheduled Medications:  (feeding  supplement) PROSource Plus  30 mL Oral TID BM   aspirin EC  81 mg Oral Daily   Chlorhexidine Gluconate Cloth  6 each Topical Daily   digoxin  0.125 mg Oral Daily   docusate sodium  100 mg Oral BID   feeding supplement  1 Container Oral TID BM   furosemide  80 mg Intravenous BID   insulin aspart  0-15 Units Subcutaneous TID WC   insulin aspart  0-5 Units Subcutaneous QHS   insulin aspart  3 Units Subcutaneous TID WC   insulin glargine-yfgn  24 Units Subcutaneous Daily   metoCLOPramide (REGLAN) injection  10 mg Intravenous Q8H   multivitamin with minerals  1 tablet Oral Daily   pantoprazole  40 mg Oral Daily   polyethylene glycol  17 g Oral Daily   potassium chloride  40 mEq Oral BID   potassium chloride  40  mEq Oral Once   scopolamine  1 patch Transdermal Q72H   sodium chloride flush  10-40 mL Intracatheter Q12H   tamsulosin  0.4 mg Oral QPC supper    Infusions:  sodium chloride 10 mL/hr at 12/31/20 1100   amiodarone 30 mg/hr (12/31/20 1100)   epinephrine 3 mcg/min (12/31/20 1100)   heparin 50 units/mL (Impella PURGE) in dextrose 5 % 1000 mL bag 10.9 mL/hr at 12/21/20 1100   heparin 600 Units/hr (12/31/20 1100)   milrinone 0.5 mcg/kg/min (12/31/20 1100)   norepinephrine (LEVOPHED) Adult infusion 25 mcg/min (12/31/20 1100)    PRN Medications: sodium chloride, acetaminophen, bisacodyl, lip balm, oxyCODONE, sodium chloride flush, traMADol   Assessment/Plan:   1. 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 Will need cath +/- cMRI when more stable - Impella 5.5 placed 10/11.  Speed increased to P9. Would continue current speed. Clear urine.  LDH up 289>>312>>323>>338>>359>> 397 Need to check position on echo today - Remains on milrinone 0.5 mcg + norepi 26 mcg + Epi 3.  - Co-ox lower today with high CVP suggestive of worsening RV failure. End-organ function still stable. Recheck co-ox and lactate this afternoon  - He refuses Protek Duo support for RV (does not want 2 pumps) - diurese today with IV lasix and metolazone.  - Given social situation advanced therapies would be challenging but if he does not recover with medical therapy, he has extensive support from [facility]. Continue to f/u with medicaid application for transplant referral  - HCV quant is negative  - Blood type B+ - We spoke with Dr. Edwena Blow at [facility] They would consider him for transplant evaluation (will need Medicaid first).    2. Acute PE  - CTA + bilateral segmental/subsegmental PE likely due to low output -  tolerating heparin   3. Acute blood loss anemia - bleeding from Impella site on 10/11 => heparin stopped 10/14 and patient had hematoma evacuation by Dr. Dorris Fetch => heparin restarted.    - On heparin gtt for PE + Impella  - Hgb stable 9.7 today. Monitor  - d/w Dr. Dorris Fetch   4. Shock liver - improving with hemodynamic support   5. AKI on CKD 3B - baseline SCr 1.8-2.0 - due to ATN from shock  - Creatinine normalized.  Scr 1.29 today  6. Thrombocytopenia - resolved - HIT panel negative.  -  SRA negative    - Doubt significant hemolysis at this point (stable LDH and rising plts), no evidence for HIT.    7. HCV - viral RNA Quant negative   8. 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 Hypervolemic Hyponatremia - 10/12, 10/14 Tolvaptan  - Na 126 today - Diurese w/ IV Lasix - Fluid restrict.    10. NSVT - less ectopy w/ amio gtt increase, continue at 60/hr  - continue amio gtt while on dual inotropes - keep K > 4.0 and Mg >2.0   11. Constipation  - Resolved w/ reglan and sorbitol  - KUB 10/18 negative  12. [facility] needs - Dr Gala Romney spoke with his caseworker April Anders [phone]). He was incarcerated almost a decade ago but not since. He has been homeless for years but recently moved into a hotel in June through the [facility]. 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 No substance use. Always first to volunteer to help other [employer] members move and do other tasks. [employer] members are very attached to him and said they would drive him back and forth to [facility] for transplant eval,if needed. Other [employer] contact: Brother John: [phone]. His sister and his mother have been in to see and support him.   Await Medicaid. Once he has insurance consider transfer to [facility] for transplant eval. I am concerned that  we may be running out of time due to RV failure but he refuses RV mechanical support.   CRITICAL CARE Performed by: Arvilla Meres  Total critical care time: 50 minutes  Critical care time was exclusive of separately billable procedures and treating other patients.  Critical care was necessary to treat or prevent imminent or life-threatening deterioration.  Critical care was time spent personally by me (independent of midlevel providers or residents) on the following activities: development of treatment plan with patient and/or surrogate as well as nursing, discussions with consultants, evaluation of patient's response to treatment, examination of patient, obtaining history from patient or surrogate, ordering and performing treatments and interventions, ordering and review of laboratory studies, ordering and review of radiographic studies, pulse oximetry and re-evaluation of patient's condition.   Arvilla Meres, MD  11:47 AM

## 2020-12-31 NOTE — Progress Notes (Signed)
ANTICOAGULATION CONSULT NOTE - Follow Up Consult  Pharmacy Consult for Heparin Indication: Impella/ pulmonary embolus/ Impella  No Known Allergies  Patient Measurements: Height: 6\' 1"  (185.4 cm) Weight: 93.7 kg (206 lb 9.1 oz) IBW/kg (Calculated) : 79.9 Heparin Dosing Weight: 92.1 kg  Vital Signs: Temp: 99 F (37.2 C) (10/21 0700) Temp Source: Oral (10/21 0700) BP: 93/58 (10/21 0000) Pulse Rate: 100 (10/21 0945)  Labs: Recent Labs    12/29/20 0437 12/29/20 1659 12/30/20 0233 12/31/20 0311  HGB 9.7*  --  9.8* 9.7*  HCT 27.7*  --  28.4* 28.8*  PLT 160  --  177 185  HEPARINUNFRC <0.10*  --  <0.10* <0.10*  CREATININE 1.17 1.27* 1.28* 1.29*     Estimated Creatinine Clearance: 76.6 mL/min (A) (by C-G formula based on SCr of 1.29 mg/dL (H)).   Medications:  Infusions:   sodium chloride 10 mL/hr at 12/31/20 0800   amiodarone 60 mg/hr (12/31/20 0800)   epinephrine 3 mcg/min (12/31/20 0800)   heparin 50 units/mL (Impella PURGE) in dextrose 5 % 1000 mL bag 10.9 mL/hr at 12/21/20 1100   heparin 600 Units/hr (12/31/20 0800)   milrinone 0.5 mcg/kg/min (12/31/20 0800)   norepinephrine (LEVOPHED) Adult infusion 24 mcg/min (12/31/20 0800)     Assessment: 50 yo male with acute bilateral PE, moderate clot burden. Pharmacy consulted to dose heparin drip for PE.  No prior AC noted.  Patient returned from surgery 10/11 s/p impella 5.5 placement. Patient taken to OR for evacuation 10/14. Oozing from impella site has improved. Minor nose bleed 10/19. Heparin remains at a fixed rate of 600 units/hour.   Impella purge providing 575 units/hr of heparin (concentration 50 units/mL, flow 11.5 mL/hr). Heparin level this morning was undetectable. Hemoglobin and platelets stable. Will follow heart failure team's plan for heparin dosing in the setting of hematoma.  Goal of Therapy:  Heparin level 0.3-0.5 units/ml for Impella + PE Monitor platelets by anticoagulation protocol: Yes   Plan:   Continue IV heparin at 600 units/hr Impella purge providing 580 units/hour of heparin for total dose of 1180 units/hour Daily CBC and heparin level Monitor for signs of bleeding  Thank you for allowing pharmacy to participate in this patient's care.  11/19, PharmD PGY2 Cardiology Pharmacy Resident Phone: [phone] 12/31/2020  10:07 AM  Please check [email or web address] for unit-specific pharmacy phone numbers.

## 2020-12-31 NOTE — Progress Notes (Addendum)
Inpatient Diabetes Program Recommendations  AACE/ADA: New Consensus Statement on Inpatient Glycemic Control (2015)  Target Ranges:  Prepandial:   less than 140 mg/dL      Peak postprandial:   less than 180 mg/dL (1-2 hours)      Critically ill patients:  140 - 180 mg/dL   Lab Results  Component Value Date   GLUCAP 227 (H) 12/31/2020   HGBA1C 6.5 (H) 12/23/2020    Review of Glycemic Control Results for Dylan Chaney, Dylan Chaney (MRN [id number]) as of 12/31/2020 12:59  Ref. Range 12/30/2020 15:54 12/30/2020 21:28 12/31/2020 06:49 12/31/2020 12:01  Glucose-Capillary Latest Ref Range: 70 - 99 mg/dL 315 (H) 176 (H) 160 (H) 227 (H)   Diabetes history: None noted Outpatient Diabetes medications: None Current orders for Inpatient glycemic control:  Novolog sensitive tid with meals and HS Semglee 24 units daily Novolog 3 units TID Novolog 0-15 units TID & HS   Inpatient Diabetes Program Recommendations:    Consider increasing Novolog 5 units TID (assuming patient is consuming >50% of meals).  Per ADA A1C of 6.5% meets criteria for diabetes. May need the addition of oral agents and DM education when appropriate.  MD please advise.    Thanks, Lujean Rave, MSN, RNC-OB Diabetes Coordinator [phone] (8a-5p)

## 2020-12-31 NOTE — Progress Notes (Signed)
Reported critical Lactic of 2.2 to MD Bensimhon along with a coox value of 45.4. Received verbal order to increase Epi dose to 5.

## 2021-01-01 DIAGNOSIS — I97638 Postprocedural hematoma of a circulatory system organ or structure following other circulatory system procedure: Secondary | ICD-10-CM

## 2021-01-01 LAB — CBC
HCT: 28.2 % — ABNORMAL LOW (ref 39.0–52.0)
Hemoglobin: 9.4 g/dL — ABNORMAL LOW (ref 13.0–17.0)
MCH: 27.9 pg (ref 26.0–34.0)
MCHC: 33.3 g/dL (ref 30.0–36.0)
MCV: 83.7 fL (ref 80.0–100.0)
Platelets: 181 10*3/uL (ref 150–400)
RBC: 3.37 MIL/uL — ABNORMAL LOW (ref 4.22–5.81)
RDW: 14.1 % (ref 11.5–15.5)
WBC: 12.3 10*3/uL — ABNORMAL HIGH (ref 4.0–10.5)
nRBC: 0 % (ref 0.0–0.2)

## 2021-01-01 LAB — GLUCOSE, CAPILLARY
Glucose-Capillary: 151 mg/dL — ABNORMAL HIGH (ref 70–99)
Glucose-Capillary: 226 mg/dL — ABNORMAL HIGH (ref 70–99)
Glucose-Capillary: 159 mg/dL — ABNORMAL HIGH (ref 70–99)
Glucose-Capillary: 184 mg/dL — ABNORMAL HIGH (ref 70–99)
Glucose-Capillary: 240 mg/dL — ABNORMAL HIGH (ref 70–99)

## 2021-01-01 LAB — BASIC METABOLIC PANEL
Anion gap: 13 (ref 5–15)
Anion gap: 10 (ref 5–15)
BUN: 24 mg/dL — ABNORMAL HIGH (ref 6–20)
BUN: 23 mg/dL — ABNORMAL HIGH (ref 6–20)
CO2: 25 mmol/L (ref 22–32)
CO2: 27 mmol/L (ref 22–32)
Calcium: 8.6 mg/dL — ABNORMAL LOW (ref 8.9–10.3)
Calcium: 8.2 mg/dL — ABNORMAL LOW (ref 8.9–10.3)
Chloride: 88 mmol/L — ABNORMAL LOW (ref 98–111)
Chloride: 91 mmol/L — ABNORMAL LOW (ref 98–111)
Creatinine, Ser: 1.32 mg/dL — ABNORMAL HIGH (ref 0.61–1.24)
Creatinine, Ser: 1.32 mg/dL — ABNORMAL HIGH (ref 0.61–1.24)
GFR, Estimated: >60 mL/min (ref >60)
GFR, Estimated: >60 mL/min (ref >60)
Glucose, Bld: 130 mg/dL — ABNORMAL HIGH (ref 70–99)
Glucose, Bld: 160 mg/dL — ABNORMAL HIGH (ref 70–99)
Potassium: 3.1 mmol/L — ABNORMAL LOW (ref 3.5–5.1)
Potassium: 3.1 mmol/L — ABNORMAL LOW (ref 3.5–5.1)
Sodium: 126 mmol/L — ABNORMAL LOW (ref 135–145)
Sodium: 128 mmol/L — ABNORMAL LOW (ref 135–145)

## 2021-01-01 LAB — HEPARIN LEVEL (UNFRACTIONATED): Heparin Unfractionated: 0.1 IU/mL — ABNORMAL LOW (ref 0.30–0.70)

## 2021-01-01 LAB — COOXEMETRY PANEL
Carboxyhemoglobin: 2 % — ABNORMAL HIGH (ref 0.5–1.5)
Methemoglobin: 1 % (ref 0.0–1.5)
O2 Saturation: 59.2 %
Total hemoglobin: 10.2 g/dL — ABNORMAL LOW (ref 12.0–16.0)

## 2021-01-01 LAB — LACTATE DEHYDROGENASE: LDH: 386 U/L — ABNORMAL HIGH (ref 98–192)

## 2021-01-01 LAB — MAGNESIUM: Magnesium: 2.2 mg/dL (ref 1.7–2.4)

## 2021-01-01 MED ORDER — POTASSIUM CHLORIDE CRYS ER 20 MEQ PO TBCR
40.0000 meq | EXTENDED_RELEASE_TABLET | Freq: Four times a day (QID) | ORAL | Status: DC
  Administered 2021-01-01: 40 meq via ORAL
  Filled 2021-01-01: qty 2

## 2021-01-01 MED ORDER — POTASSIUM CHLORIDE 20 MEQ PO PACK
40.0000 meq | PACK | Freq: Once | ORAL | Status: AC
  Administered 2021-01-01: 40 meq via ORAL
  Filled 2021-01-01: qty 2

## 2021-01-01 MED ORDER — POTASSIUM CHLORIDE 20 MEQ PO PACK
40.0000 meq | PACK | Freq: Two times a day (BID) | ORAL | Status: DC
  Administered 2021-01-03 – 2021-01-05 (×6): 40 meq via ORAL
  Filled 2021-01-01 (×6): qty 2

## 2021-01-01 MED ORDER — POTASSIUM CHLORIDE CRYS ER 20 MEQ PO TBCR
40.0000 meq | EXTENDED_RELEASE_TABLET | Freq: Once | ORAL | Status: AC
  Administered 2021-01-01: 40 meq via ORAL
  Filled 2021-01-01: qty 2

## 2021-01-01 MED ORDER — POTASSIUM CHLORIDE 10 MEQ/50ML IV SOLN
10.0000 meq | INTRAVENOUS | Status: AC
  Administered 2021-01-01 – 2021-01-02 (×3): 10 meq via INTRAVENOUS
  Filled 2021-01-01 (×3): qty 50

## 2021-01-01 MED ORDER — POTASSIUM CHLORIDE 10 MEQ/50ML IV SOLN
10.0000 meq | INTRAVENOUS | Status: AC
  Administered 2021-01-01 (×2): 10 meq via INTRAVENOUS
  Filled 2021-01-01 (×2): qty 50

## 2021-01-01 MED ORDER — METOLAZONE 5 MG PO TABS
5.0000 mg | ORAL_TABLET | Freq: Once | ORAL | Status: AC
  Administered 2021-01-01: 5 mg via ORAL
  Filled 2021-01-01: qty 1

## 2021-01-01 NOTE — Progress Notes (Signed)
Patient ID: Dylan Chaney, male   DOB: 1970-10-17, 50 y.o.   MRN: [id number]    Advanced Heart Failure Rounding Note   Subjective:    - 10/11 Impella 5.5 placed - 12/22/20 Impllea turned up to P9 and milrinone increased to 0.5 mcg. Given dose of tolvaptan.  - 10/14 To OR for Impella site hematoma washout.   Continues on Impella at P-9 Flow 4.9  Inotropes/pressors increased. On Epi 5, Milrinone 0.5, NE at 30  Co-ox up to 59% today  Feeling much better. Denies SOB, orthopnea or PND. Ab pain improved  CVP remains high 22 -> 20  Diuresing on IV lasix and metolazone   On heparin -> no bleeding  Impella flows/waveforms stable. VAD interrogated personally. Parameters stable.   Objective:   Weight Range:  Vital Signs:   Temp:  [97.9 F (36.6 C)-98.4 F (36.9 C)] 98.3 F (36.8 C) (10/22 0829) Pulse Rate:  [30-100] 93 (10/22 0845) Resp:  [13-26] 20 (10/22 0845) SpO2:  [91 %-100 %] 97 % (10/22 0845) Weight:  [92.4 kg] 92.4 kg (10/22 0630) Last BM Date: 12/30/20  Weight change: Filed Weights   12/30/20 0611 12/31/20 0600 01/01/21 0630  Weight: 93.5 kg 93.7 kg 92.4 kg    Intake/Output:   Intake/Output Summary (Last 24 hours) at 01/01/2021 0857 Last data filed at 01/01/2021 0800 Gross per 24 hour  Intake 4182.47 ml  Output 4825 ml  Net -642.53 ml    PHYSICAL EXAM: CVP 20 General:  fatigued appearing, sitting up in chair. No respiratory difficulty HEENT: normal Neck: supple. no JVP to ear. Carotids 2+ bilat; no bruits. No lymphadenopathy or thryomegaly appreciated. Cor: Impella hum regular  Lungs: clear Abdomen: soft, nontender, nondistended. No hepatosplenomegaly. No bruits or masses. Good bowel sounds. Extremities: no cyanosis, clubbing, rash, edema Neuro: alert & orientedx3, cranial nerves grossly intact. moves all 4 extremities w/o difficulty. Affect pleasant  Telemetry:  NSR 90s Personally reviewed   Labs: Basic Metabolic Panel: Recent Labs  Lab  12/27/20 0341 12/28/20 0357 12/29/20 0437 12/29/20 1659 12/30/20 0233 12/31/20 0311 01/01/21 0357  NA 127*   < > 125* 125* 126* 125* 126*  K 3.6   < > 3.7 4.1 3.9 3.6 3.1*  CL 92*   < > 88* 88* 88* 89* 88*  CO2 27   < > 27 28 25 25 25   GLUCOSE 189*   < > 163* 166* 154* 181* 130*  BUN 20   < > 20 23* 24* 23* 24*  CREATININE 1.11   < > 1.17 1.27* 1.28* 1.29* 1.32*  CALCIUM 8.2*   < > 8.7* 8.6* 8.7* 8.5* 8.6*  MG 1.9  --   --   --  1.9 2.3 2.2   < > = values in this interval not displayed.     Liver Function Tests: No results for input(s): AST, ALT, ALKPHOS, BILITOT, PROT, ALBUMIN in the last 168 hours.  No results for input(s): LIPASE, AMYLASE in the last 168 hours. No results for input(s): AMMONIA in the last 168 hours.  CBC: Recent Labs  Lab 12/28/20 0357 12/29/20 0437 12/30/20 0233 12/31/20 0311 01/01/21 0357  WBC 10.5 10.8* 11.0* 11.5* 12.3*  HGB 10.0* 9.7* 9.8* 9.7* 9.4*  HCT 29.1* 27.7* 28.4* 28.8* 28.2*  MCV 81.1 80.1 80.9 81.6 83.7  PLT 142* 160 177 185 181     Cardiac Enzymes: No results for input(s): CKTOTAL, CKMB, CKMBINDEX, TROPONINI in the last 168 hours.   BNP: BNP (last 3  results) Recent Labs    12/15/20 1716 12/17/20 0457  BNP 2,024.7* 1,742.2*     ProBNP (last 3 results) No results for input(s): PROBNP in the last 8760 hours.    Other results:  Imaging: No results found.   Medications:     Scheduled Medications:  (feeding supplement) PROSource Plus  30 mL Oral TID BM   aspirin EC  81 mg Oral Daily   Chlorhexidine Gluconate Cloth  6 each Topical Daily   digoxin  0.125 mg Oral Daily   docusate sodium  100 mg Oral BID   feeding supplement  1 Container Oral TID BM   furosemide  80 mg Intravenous BID   insulin aspart  0-15 Units Subcutaneous TID WC   insulin aspart  0-5 Units Subcutaneous QHS   insulin aspart  3 Units Subcutaneous TID WC   insulin glargine-yfgn  24 Units Subcutaneous Daily   metoCLOPramide (REGLAN) injection   10 mg Intravenous Q8H   metolazone  5 mg Oral Once   multivitamin with minerals  1 tablet Oral Daily   pantoprazole  40 mg Oral Daily   polyethylene glycol  17 g Oral Daily   potassium chloride  40 mEq Oral BID   scopolamine  1 patch Transdermal Q72H   sodium chloride flush  10-40 mL Intracatheter Q12H   tamsulosin  0.4 mg Oral QPC supper    Infusions:  sodium chloride 10 mL/hr at 01/01/21 0800   amiodarone 30 mg/hr (01/01/21 0800)   epinephrine 5 mcg/min (01/01/21 0800)   heparin 50 units/mL (Impella PURGE) in dextrose 5 % 1000 mL bag 10.9 mL/hr at 12/21/20 1100   heparin 600 Units/hr (01/01/21 0800)   milrinone 0.5 mcg/kg/min (01/01/21 0800)   norepinephrine (LEVOPHED) Adult infusion 32 mcg/min (01/01/21 0800)    PRN Medications: sodium chloride, acetaminophen, bisacodyl, lip balm, oxyCODONE, sodium chloride flush, traMADol   Assessment/Plan:   1. 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 Will need cath +/- cMRI when more stable - Impella 5.5 placed 10/11.  Speed increased to P9. Would continue current speed. Clear urine.  LDH up 289>>312>>323>>338>>359>> 397 > 386  - Echo today RV function moderately reduced. Impella position ok 3.6 cm - Now on milrinone 0.5 mcg + norepi 26 mcg + Epi 5 - End-organ function still stable. Recheck co-ox and lactate this afternoon  - He refuses Protek Duo support for RV (does not want 2 pumps). I discussed with him again today - Continue to diurese today with IV lasix and metolazone. Supp K - Given social situation advanced therapies would be challenging but if he does not recover with medical therapy, he has extensive support from [facility]. Continue to f/u with medicaid application for transplant referral  - HCV quant is negative   - Blood type B+ - We spoke with Dr. Edwena Blow at [facility]. They would consider him for transplant evaluation (will need Medicaid first).    2. Acute PE  - CTA + bilateral segmental/subsegmental PE likely due to low output - tolerating heparin   3. Acute blood loss anemia - bleeding from Impella site on 10/11 => heparin stopped 10/14 and patient had hematoma evacuation by Dr. Dorris Fetch => heparin restarted.    - On heparin gtt for PE + Impella  -  Hgb stable 9.4 today. Monitor  - d/w Dr. Dorris Fetch   4. Shock liver - improving with hemodynamic support   5. AKI on CKD 3B - baseline SCr 1.8-2.0 - due to ATN from shock  - Creatinine normalized.  Scr 1.32 today  6. Thrombocytopenia - resolved - HIT panel negative.  - SRA negative    - Doubt significant hemolysis at this point (stable LDH and rising plts), no evidence for HIT.    7. HCV - viral RNA Quant negative   8. 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 Hypervolemic Hyponatremia - 10/12, 10/14 Tolvaptan  - Na 126 today - Diurese w/ IV Lasix - Fluid restrict.    10. NSVT - less ectopy w/ amio gtt increase, continue at 60/hr  - continue amio gtt while on dual inotropes - keep K > 4.0 and Mg >2.0   11. Constipation  - Resolved w/ reglan and sorbitol  - KUB 10/18 negative  12. [facility] needs - Dr Gala Romney spoke with his caseworker April Anders [phone]). He was incarcerated almost a decade ago but not since. He has been homeless for years but recently moved into a hotel in June through the [facility]. 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 No substance use. Always first to volunteer to help other [employer] members move and do other tasks. [employer] members are very attached to him and said they would drive him back and forth to [facility] for transplant eval,if  needed. Other [employer] contact: Brother John: [phone]. His sister and his mother have been in to see and support him.   Await Medicaid. Once he has insurance consider transfer to [facility] for transplant eval. I am concerned that we may be running out of time due to RV failure but he refuses RV mechanical support.   CRITICAL CARE Performed by: Arvilla Meres  Total critical care time: 40 minutes  Critical care time was exclusive of separately billable procedures and treating other patients.  Critical care was necessary to treat or prevent imminent or life-threatening deterioration.  Critical care was time spent personally by me (independent of midlevel providers or residents) on the following activities: development of treatment plan with patient and/or surrogate as well as nursing, discussions with consultants, evaluation of patient's response to treatment, examination of patient, obtaining history from patient or surrogate, ordering and performing treatments and interventions, ordering and review of laboratory studies, ordering and review of radiographic studies, pulse oximetry and re-evaluation of patient's condition.   Arvilla Meres, MD  8:57 AM

## 2021-01-01 NOTE — Progress Notes (Signed)
8 Days Post-Op Procedure(s) (LRB): EVACUATION HEMATOMA (N/A) Subjective: No complaints. No numbness or tingling or pain in right arm. Sitting up in chair eating.  Impella at P9, epi 5, milrinone 0.5 and NE 32 with Co-ox 59.2 this am   Objective: Vital signs in last 24 hours: Temp:  [97.9 F (36.6 C)-98.4 F (36.9 C)] 98.3 F (36.8 C) (10/22 0829) Pulse Rate:  [30-111] 79 (10/22 1145) Cardiac Rhythm: Normal sinus rhythm (10/22 0800) Resp:  [12-32] 26 (10/22 1145) BP: (82-149)/(65-89) 109/78 (10/22 1145) SpO2:  [87 %-100 %] 87 % (10/22 1145) Weight:  [92.4 kg] 92.4 kg (10/22 0630)  Hemodynamic parameters for last 24 hours: CVP:  [12 mmHg-54 mmHg] 20 mmHg  Intake/Output from previous day: 10/21 0701 - 10/22 0700 In: 4258.8 [P.O.:1810; I.V.:2124.2; IV Piggyback:50] Out: 5225 [Urine:5225] Intake/Output this shift: Total I/O In: 477.5 [P.O.:200; I.V.:181.8; Other:45.7; IV Piggyback:50] Out: 1175 [Urine:1175]  General appearance: alert and cooperative Neurologic: intact Heart: regular rate and rhythm Lungs: clear to auscultation bilaterally Impella insertion site dry with no significant swelling. Right radial pulse palpable.  Lab Results: Recent Labs    12/31/20 0311 01/01/21 0357  WBC 11.5* 12.3*  HGB 9.7* 9.4*  HCT 28.8* 28.2*  PLT 185 181   BMET:  Recent Labs    12/31/20 0311 01/01/21 0357  NA 125* 126*  K 3.6 3.1*  CL 89* 88*  CO2 25 25  GLUCOSE 181* 130*  BUN 23* 24*  CREATININE 1.29* 1.32*  CALCIUM 8.5* 8.6*    PT/INR: No results for input(s): LABPROT, INR in the last 72 hours. ABG    Component Value Date/Time   PHART 7.470 (H) 12/23/2020 0435   HCO3 28.7 (H) 12/23/2020 0435   TCO2 30 12/24/2020 1744   ACIDBASEDEF 2.0 12/22/2020 0415   O2SAT 59.2 01/01/2021 0617   CBG (last 3)  Recent Labs    01/01/21 0659 01/01/21 0826 01/01/21 1134  GLUCAP 151* 226* 159*    Assessment/Plan: S/P Procedure(s) (LRB): EVACUATION HEMATOMA (N/A) Impella  insertion site stable with normal function.  Awaiting Medicaid eval for transplant consideration.   LOS: 16 days    Alleen Borne 01/01/2021

## 2021-01-01 NOTE — Plan of Care (Signed)
Stable during shift. No Delirium noted but very withdrawn, minimally interactive and gets frustrated with attempts at conversation.   Remains on Impella, appropriate waveforms, no suction alarms, flow 5.0 to 5.3. Epi @ 5. Levo gtt at 32, titrating for Impella MAP >/=70. Warm and dry. NSR 80-90, 1 run of 6 beats of VT, frequent PVCs, K 3.1, replacing IV/scheduled po. Amio gtt at 30. Milrinone at 0.5. Coox improved at 59.2 this am.   Na 126, following fluid restrictions. Improved appetite. No nausea.   Diuresing, 5.2 L/24 hr. Down 1.3 kg this am. CVP 18-20.  Good strength, MAE, repositions in bed, OOB to recliner this am.   Problem: Education: Goal: Knowledge of General Education information will improve Description: Including pain rating scale, medication(s)/side effects and non-pharmacologic comfort measures Outcome: Progressing   Problem: Clinical Measurements: Goal: Ability to maintain clinical measurements within normal limits will improve Outcome: Progressing Goal: Will remain free from infection Outcome: Progressing Goal: Respiratory complications will improve Outcome: Progressing Goal: Cardiovascular complication will be avoided Outcome: Progressing   Problem: Activity: Goal: Risk for activity intolerance will decrease Outcome: Progressing   Problem: Nutrition: Goal: Adequate nutrition will be maintained Outcome: Progressing   Problem: Coping: Goal: Level of anxiety will decrease Outcome: Progressing   Problem: Elimination: Goal: Will not experience complications related to bowel motility Outcome: Progressing Goal: Will not experience complications related to urinary retention Outcome: Progressing   Problem: Pain Managment: Goal: General experience of comfort will improve Outcome: Progressing   Problem: Safety: Goal: Ability to remain free from injury will improve Outcome: Progressing   Problem: Skin Integrity: Goal: Risk for impaired skin integrity will  decrease Outcome: Progressing

## 2021-01-01 NOTE — Progress Notes (Signed)
ANTICOAGULATION CONSULT NOTE - Follow Up Consult  Pharmacy Consult for Heparin Indication: Impella/ pulmonary embolus/ Impella  No Known Allergies  Patient Measurements: Height: 6\' 1"  (185.4 cm) Weight: 92.4 kg (203 lb 11.3 oz) IBW/kg (Calculated) : 79.9 Heparin Dosing Weight: 92.1 kg  Vital Signs: Temp: 98.1 F (36.7 C) (10/22 0400) Temp Source: Oral (10/22 0400) Pulse Rate: 89 (10/22 0700)  Labs: Recent Labs    12/30/20 0233 12/31/20 0311 01/01/21 0357  HGB 9.8* 9.7* 9.4*  HCT 28.4* 28.8* 28.2*  PLT 177 185 181  HEPARINUNFRC <0.10* <0.10* 0.10*  CREATININE 1.28* 1.29* 1.32*     Estimated Creatinine Clearance: 74.8 mL/min (A) (by C-G formula based on SCr of 1.32 mg/dL (H)).   Medications:  Infusions:   sodium chloride 10 mL/hr at 01/01/21 0700   amiodarone 30 mg/hr (01/01/21 0700)   epinephrine 5 mcg/min (01/01/21 0700)   heparin 50 units/mL (Impella PURGE) in dextrose 5 % 1000 mL bag 10.9 mL/hr at 12/21/20 1100   heparin 600 Units/hr (01/01/21 0700)   milrinone 0.5 mcg/kg/min (01/01/21 0700)   norepinephrine (LEVOPHED) Adult infusion 32 mcg/min (01/01/21 0700)   potassium chloride 10 mEq (01/01/21 0703)     Assessment: 50 yo male with acute bilateral PE, moderate clot burden. Pharmacy consulted to dose heparin drip for PE.  No prior AC noted.  Patient returned from surgery 10/11 s/p impella 5.5 placement. Patient taken to OR for evacuation 10/14. Oozing from impella site has improved. Minor nose bleed 10/19. Heparin remains at a fixed rate of 600 units/hour.   Impella purge providing 565 units/hr of heparin (concentration 50 units/mL, flow 11.3 mL/hr). Heparin level this morning was subtherapeutic at 0.1, on systemic 600 units/hr. Hemoglobin and platelets stable. Will follow heart failure team's plan for heparin dosing in the setting of hematoma. Still oozing at site - did have nose bleed today.  Goal of Therapy:  Heparin level 0.3-0.5 units/ml for Impella +  PE Monitor platelets by anticoagulation protocol: Yes   Plan:  Continue IV heparin at 600 units/hr Impella purge providing 565 units/hour of heparin for total dose of 1165 units/hour Daily CBC and heparin level Monitor for signs of bleeding  Thank you for allowing pharmacy to participate in this patient's care.  11/19, PharmD, BCCCP Clinical Pharmacist  Phone: [phone] 01/01/2021 7:41 AM  Please check AMION for all [facility] Pharmacy phone numbers After 10:00 PM, call Main Pharmacy [phone]

## 2021-01-02 LAB — CBC
HCT: 28.7 % — ABNORMAL LOW (ref 39.0–52.0)
Hemoglobin: 9.7 g/dL — ABNORMAL LOW (ref 13.0–17.0)
MCH: 27.7 pg (ref 26.0–34.0)
MCHC: 33.8 g/dL (ref 30.0–36.0)
MCV: 82 fL (ref 80.0–100.0)
Platelets: 194 10*3/uL (ref 150–400)
RBC: 3.5 MIL/uL — ABNORMAL LOW (ref 4.22–5.81)
RDW: 14.4 % (ref 11.5–15.5)
WBC: 11.2 10*3/uL — ABNORMAL HIGH (ref 4.0–10.5)
nRBC: 0 % (ref 0.0–0.2)

## 2021-01-02 LAB — HEPARIN LEVEL (UNFRACTIONATED): Heparin Unfractionated: 0.15 IU/mL — ABNORMAL LOW (ref 0.30–0.70)

## 2021-01-02 LAB — MAGNESIUM: Magnesium: 2.1 mg/dL (ref 1.7–2.4)

## 2021-01-02 LAB — COOXEMETRY PANEL
Carboxyhemoglobin: 1.7 % — ABNORMAL HIGH (ref 0.5–1.5)
Methemoglobin: 0.9 % (ref 0.0–1.5)
O2 Saturation: 57.7 %
Total hemoglobin: 9.9 g/dL — ABNORMAL LOW (ref 12.0–16.0)

## 2021-01-02 LAB — BASIC METABOLIC PANEL
Anion gap: 11 (ref 5–15)
BUN: 24 mg/dL — ABNORMAL HIGH (ref 6–20)
CO2: 28 mmol/L (ref 22–32)
Calcium: 8.8 mg/dL — ABNORMAL LOW (ref 8.9–10.3)
Chloride: 89 mmol/L — ABNORMAL LOW (ref 98–111)
Creatinine, Ser: 1.3 mg/dL — ABNORMAL HIGH (ref 0.61–1.24)
GFR, Estimated: >60 mL/min (ref >60)
Glucose, Bld: 169 mg/dL — ABNORMAL HIGH (ref 70–99)
Potassium: 3.7 mmol/L (ref 3.5–5.1)
Sodium: 128 mmol/L — ABNORMAL LOW (ref 135–145)

## 2021-01-02 LAB — GLUCOSE, CAPILLARY
Glucose-Capillary: 200 mg/dL — ABNORMAL HIGH (ref 70–99)
Glucose-Capillary: 202 mg/dL — ABNORMAL HIGH (ref 70–99)
Glucose-Capillary: 156 mg/dL — ABNORMAL HIGH (ref 70–99)
Glucose-Capillary: 140 mg/dL — ABNORMAL HIGH (ref 70–99)

## 2021-01-02 LAB — LACTATE DEHYDROGENASE: LDH: 386 U/L — ABNORMAL HIGH (ref 98–192)

## 2021-01-02 MED ORDER — POTASSIUM CHLORIDE 10 MEQ/50ML IV SOLN
10.0000 meq | INTRAVENOUS | Status: AC
Start: 2021-01-02 — End: 2021-01-02
  Administered 2021-01-02 (×4): 10 meq via INTRAVENOUS
  Filled 2021-01-02: qty 50

## 2021-01-02 MED ORDER — SPIRONOLACTONE 25 MG PO TABS
25.0000 mg | ORAL_TABLET | Freq: Every day | ORAL | Status: DC
  Administered 2021-01-02 – 2021-01-05 (×4): 25 mg via ORAL
  Filled 2021-01-02 (×4): qty 1

## 2021-01-02 NOTE — Progress Notes (Signed)
Patient ID: Dylan Chaney, male   DOB: Sep 26, 1970, 50 y.o.   MRN: [id number]    Advanced Heart Failure Rounding Note   Subjective:    - 10/11 Impella 5.5 placed - 12/22/20 Impllea turned up to P9 and milrinone increased to 0.5 mcg. Given dose of tolvaptan.  - 10/14 To OR for Impella site hematoma washout.   Continues on Impella at P-9 Flow 5.0 LDH stable 386. On heparin  Remains On Epi 5, Milrinone 0.5, NE at 26  Co-ox up to 58% today. On lasix 80 iv bid and metolazone. Weight is down 7 pounds. CVP 16-17   Feels ok. Frustrated/upset about taking po potassium and now refusing to take more  Impella flows/waveforms stable. VAD interrogated personally. Parameters stable.   Objective:   Weight Range:  Vital Signs:   Temp:  [98.1 F (36.7 C)-98.3 F (36.8 C)] 98.3 F (36.8 C) (10/23 0400) Pulse Rate:  [74-114] 93 (10/23 0915) Resp:  [12-32] 16 (10/23 0915) BP: (72-149)/(34-89) 72/34 (10/22 1940) SpO2:  [87 %-100 %] 99 % (10/23 0915) Weight:  [89 kg] 89 kg (10/23 0630) Last BM Date: 12/30/20  Weight change: Filed Weights   01/01/21 0630 01/02/21 0500 01/02/21 0630  Weight: 92.4 kg 89 kg 89 kg    Intake/Output:   Intake/Output Summary (Last 24 hours) at 01/02/2021 0934 Last data filed at 01/02/2021 0900 Gross per 24 hour  Intake 3504.99 ml  Output 6275 ml  Net -2770.01 ml    PHYSICAL EXAM: CVP 16 General:  fatigued appearing, sitting up in chair. No respiratory difficulty HEENT: normal Neck: supple. no JVD. Carotids 2+ bilat; no bruits. No lymphadenopathy or thryomegaly appreciated. Cor: Impella site ok. PMI nondisplaced. Regular rate & rhythm. No rubs, gallops or murmurs. Lungs: clear Abdomen: soft, nontender, nondistended. No hepatosplenomegaly. No bruits or masses. Good bowel sounds. Extremities: no cyanosis, clubbing, rash, tr edema + TED hose Neuro: alert & orientedx3, cranial nerves grossly intact. moves all 4 extremities w/o difficulty. Affect  pleasant   Telemetry:  NSR 90s Personally reviewed   Labs: Basic Metabolic Panel: Recent Labs  Lab 12/27/20 0341 12/28/20 0357 12/30/20 0233 12/31/20 0311 01/01/21 0357 01/01/21 1400 01/02/21 0459  NA 127*   < > 126* 125* 126* 128* 128*  K 3.6   < > 3.9 3.6 3.1* 3.1* 3.7  CL 92*   < > 88* 89* 88* 91* 89*  CO2 27   < > 25 25 25 27 28   GLUCOSE 189*   < > 154* 181* 130* 160* 169*  BUN 20   < > 24* 23* 24* 23* 24*  CREATININE 1.11   < > 1.28* 1.29* 1.32* 1.32* 1.30*  CALCIUM 8.2*   < > 8.7* 8.5* 8.6* 8.2* 8.8*  MG 1.9  --  1.9 2.3 2.2  --  2.1   < > = values in this interval not displayed.     Liver Function Tests: No results for input(s): AST, ALT, ALKPHOS, BILITOT, PROT, ALBUMIN in the last 168 hours.  No results for input(s): LIPASE, AMYLASE in the last 168 hours. No results for input(s): AMMONIA in the last 168 hours.  CBC: Recent Labs  Lab 12/29/20 0437 12/30/20 0233 12/31/20 0311 01/01/21 0357 01/02/21 0459  WBC 10.8* 11.0* 11.5* 12.3* 11.2*  HGB 9.7* 9.8* 9.7* 9.4* 9.7*  HCT 27.7* 28.4* 28.8* 28.2* 28.7*  MCV 80.1 80.9 81.6 83.7 82.0  PLT 160 177 185 181 194     Cardiac Enzymes: No results for  input(s): CKTOTAL, CKMB, CKMBINDEX, TROPONINI in the last 168 hours.   BNP: BNP (last 3 results) Recent Labs    12/15/20 1716 12/17/20 0457  BNP 2,024.7* 1,742.2*     ProBNP (last 3 results) No results for input(s): PROBNP in the last 8760 hours.    Other results:  Imaging: No results found.   Medications:     Scheduled Medications:  (feeding supplement) PROSource Plus  30 mL Oral TID BM   aspirin EC  81 mg Oral Daily   Chlorhexidine Gluconate Cloth  6 each Topical Daily   digoxin  0.125 mg Oral Daily   docusate sodium  100 mg Oral BID   feeding supplement  1 Container Oral TID BM   furosemide  80 mg Intravenous BID   insulin aspart  0-15 Units Subcutaneous TID WC   insulin aspart  0-5 Units Subcutaneous QHS   insulin aspart  3 Units  Subcutaneous TID WC   insulin glargine-yfgn  24 Units Subcutaneous Daily   metoCLOPramide (REGLAN) injection  10 mg Intravenous Q8H   multivitamin with minerals  1 tablet Oral Daily   pantoprazole  40 mg Oral Daily   polyethylene glycol  17 g Oral Daily   potassium chloride  40 mEq Oral BID   scopolamine  1 patch Transdermal Q72H   sodium chloride flush  10-40 mL Intracatheter Q12H   tamsulosin  0.4 mg Oral QPC supper    Infusions:  sodium chloride 10 mL/hr at 01/02/21 0900   amiodarone 30 mg/hr (01/02/21 0900)   epinephrine 5 mcg/min (01/02/21 0900)   heparin 50 units/mL (Impella PURGE) in dextrose 5 % 1000 mL bag 10.9 mL/hr at 12/21/20 1100   heparin 600 Units/hr (01/02/21 0900)   milrinone 0.5 mcg/kg/min (01/02/21 0900)   norepinephrine (LEVOPHED) Adult infusion 26 mcg/min (01/02/21 0900)    PRN Medications: sodium chloride, acetaminophen, bisacodyl, lip balm, oxyCODONE, sodium chloride flush, traMADol   Assessment/Plan:   1. 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 Will need coronary angio eventually - Impella 5.5 placed 10/11.  Speed increased to P9. Would continue current speed. Clear urine.  LDH stable at 386 - Echo 10.23 RV function moderately reduced. Impella position ok 3.6 cm - Remains on milrinone 0.5 mcg + norepi 26 mcg + Epi 5 - End-organ function still stable. Recheck co-ox and lactate this afternoon  - He refuses Protek Duo support for RV (does not want 2 pumps). I discussed with him multiple times - Continue to diurese today with IV lasix today. Will hold metolazone to give potassium a chance to catch up. Can add low-dose spiro - Given social situation advanced therapies would be challenging but if he does not recover with medical therapy,  he has extensive support from [facility]. Continue to f/u with medicaid application for transplant referral  - HCV quant is negative  - Blood type B+ - We spoke with Dr. Edwena Blow at [facility]. They would consider him for transplant evaluation (will need Medicaid first).    2. Acute PE  - CTA + bilateral segmental/subsegmental PE likely due to low output - continue heparin  3. Acute blood loss anemia - bleeding from Impella site on 10/11 => heparin stopped 10/14 and patient had hematoma evacuation by Dr. Dorris Fetch =>  heparin restarted.    - On heparin gtt for PE + Impella  - Hgb stable 9.7 today. Monitor  - d/w Dr. Dorris Fetch   4. Shock liver - improving with hemodynamic support   5. AKI on CKD 3B - baseline SCr 1.8-2.0 - due to ATN from shock  - Creatinine normalized.  Scr 1.30 today  6. Thrombocytopenia - resolved - HIT panel negative.  - SRA negative    - Doubt significant hemolysis at this point (stable LDH and rising plts), no evidence for HIT.    7. HCV - viral RNA Quant negative   8. 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 Hypervolemic Hyponatremia - 10/12, 10/14 Tolvaptan  - Na 126 today - Diurese w/ IV Lasix - Fluid restrict.    10. NSVT - continue amio gtt while on inotropes - keep K > 4.0 and Mg >2.0   11. Constipation  - Resolved w/ reglan and sorbitol   12. [facility] needs - Dr Gala Romney spoke with his caseworker April Anders [phone]). He was incarcerated almost a decade ago but not since. He has been homeless for years but recently moved into a hotel in June through the [facility]. 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 No substance use. Always first to volunteer to help other [employer] members move and do other tasks. [employer] members are very attached to him and said they would drive him back and  forth to [facility] for transplant eval,if needed. Other [employer] contact: Brother John: [phone]. His sister and his mother have been in to see and support him.   Await Medicaid. Once he has insurance consider transfer to [facility] for transplant eval. I am concerned that we may be running out of time due to RV failure but he refuses RV mechanical support.   CRITICAL CARE Performed by: Arvilla Meres  Total critical care time: 35 minutes  Critical care time was exclusive of separately billable procedures and treating other patients.  Critical care was necessary to treat or prevent imminent or life-threatening deterioration.  Critical care was time spent personally by me (independent of midlevel providers or residents) on the following activities: development of treatment plan with patient and/or surrogate as well as nursing, discussions with consultants, evaluation of patient's response to treatment, examination of patient, obtaining history from patient or surrogate, ordering and performing treatments and interventions, ordering and review of laboratory studies, ordering and review of radiographic studies, pulse oximetry and re-evaluation of patient's condition.   Arvilla Meres, MD  9:34 AM

## 2021-01-02 NOTE — Progress Notes (Signed)
ANTICOAGULATION CONSULT NOTE - Follow Up Consult  Pharmacy Consult for Heparin Indication: Impella/ pulmonary embolus/ Impella  No Known Allergies  Patient Measurements: Height: 6\' 1"  (185.4 cm) Weight: 89 kg (196 lb 3.4 oz) IBW/kg (Calculated) : 79.9 Heparin Dosing Weight: 92.1 kg  Vital Signs: Temp: 98.3 F (36.8 C) (10/23 0400) Temp Source: Oral (10/23 0400) BP: 72/34 (10/22 1940) Pulse Rate: 92 (10/23 0700)  Labs: Recent Labs    12/31/20 0311 01/01/21 0357 01/01/21 1400 01/02/21 0459 01/02/21 0500  HGB 9.7* 9.4*  --  9.7*  --   HCT 28.8* 28.2*  --  28.7*  --   PLT 185 181  --  194  --   HEPARINUNFRC <0.10* 0.10*  --   --  0.15*  CREATININE 1.29* 1.32* 1.32* 1.30*  --      Estimated Creatinine Clearance: 76 mL/min (A) (by C-G formula based on SCr of 1.3 mg/dL (H)).   Medications:  Infusions:   sodium chloride 10 mL/hr at 01/02/21 0700   amiodarone 30 mg/hr (01/02/21 0700)   epinephrine 5 mcg/min (01/02/21 0700)   heparin 50 units/mL (Impella PURGE) in dextrose 5 % 1000 mL bag 10.9 mL/hr at 12/21/20 1100   heparin 600 Units/hr (01/02/21 0700)   milrinone 0.5 mcg/kg/min (01/02/21 0700)   norepinephrine (LEVOPHED) Adult infusion 26 mcg/min (01/02/21 0700)     Assessment: 50 yo male with acute bilateral PE, moderate clot burden. Pharmacy consulted to dose heparin drip for PE.  No prior AC noted.  Patient returned from surgery 10/11 s/p impella 5.5 placement. Patient taken to OR for evacuation 10/14. Oozing from impella site has improved. Minor nose bleed 10/19. Heparin remains at a fixed rate of 600 units/hour.   Impella purge providing 570 units/hr of heparin (concentration 50 units/mL, flow 11.4 mL/hr). Heparin level this morning was subtherapeutic at 0.15, on systemic 600 units/hr. Hemoglobin and platelets stable. LDH 386. Will follow heart failure team's plan for heparin dosing in the setting of hematoma. Still oozing at site - did have nose bleed  today.  Goal of Therapy:  Heparin level 0.3-0.5 units/ml for Impella + PE Monitor platelets by anticoagulation protocol: Yes   Plan:  Continue IV heparin at 600 units/hr Impella purge providing 565 units/hour of heparin for total dose of 1170 units/hour Daily CBC and heparin level Monitor for signs of bleeding  Thank you for allowing pharmacy to participate in this patient's care.  11/19, PharmD, BCCCP Clinical Pharmacist  Phone: [phone] 01/02/2021 7:23 AM  Please check AMION for all [facility] Pharmacy phone numbers After 10:00 PM, call Main Pharmacy [phone]

## 2021-01-02 NOTE — Plan of Care (Addendum)
Stable during shift. Slept ok. A&O but withdrawn and irritable at times.   Impella at P9, flows 4.9 - 5.2. Good waveforms, no suction alarms. Warm and dry with dopplerable pulses. Epi gtt @ 5, levo gtt @ 26, weaned slightly overnight for MAP > 70 on Impella. Milrinone gtt @ 0.5, coox 57.7. NSR with prolonged QT/QTc, amio gtt @ 30. PVCs, 1 run of 6 beats NSVT around 0230. K replaced during shift, 3.7 on am labs.   Diuresing well with urine output > 6 L/24 hr. CVP 20-21. Edema improved with unna boots.   Heparin via purge and systemic, Xa 0.15. Na 128, following free water/fluid restrictions.   Awaiting medicaid and transfer to [facility] for heart transplant work up.   Problem: Education: Goal: Knowledge of General Education information will improve Description: Including pain rating scale, medication(s)/side effects and non-pharmacologic comfort measures Outcome: Progressing   Problem: Clinical Measurements: Goal: Ability to maintain clinical measurements within normal limits will improve Outcome: Progressing Goal: Will remain free from infection Outcome: Progressing Goal: Diagnostic test results will improve Outcome: Progressing Goal: Respiratory complications will improve Outcome: Progressing Goal: Cardiovascular complication will be avoided Outcome: Progressing   Problem: Activity: Goal: Risk for activity intolerance will decrease Outcome: Progressing   Problem: Nutrition: Goal: Adequate nutrition will be maintained Outcome: Progressing   Problem: Coping: Goal: Level of anxiety will decrease Outcome: Progressing   Problem: Elimination: Goal: Will not experience complications related to bowel motility Outcome: Progressing Goal: Will not experience complications related to urinary retention Outcome: Progressing   Problem: Pain Managment: Goal: General experience of comfort will improve Outcome: Progressing   Problem: Safety: Goal: Ability to remain free from injury will  improve Outcome: Progressing   Problem: Skin Integrity: Goal: Risk for impaired skin integrity will decrease Outcome: Progressing

## 2021-01-03 LAB — BASIC METABOLIC PANEL
Anion gap: 10 (ref 5–15)
Anion gap: 10 (ref 5–15)
BUN: 22 mg/dL — ABNORMAL HIGH (ref 6–20)
BUN: 26 mg/dL — ABNORMAL HIGH (ref 6–20)
CO2: 30 mmol/L (ref 22–32)
CO2: 29 mmol/L (ref 22–32)
Calcium: 8.9 mg/dL (ref 8.9–10.3)
Calcium: 9.1 mg/dL (ref 8.9–10.3)
Chloride: 89 mmol/L — ABNORMAL LOW (ref 98–111)
Chloride: 88 mmol/L — ABNORMAL LOW (ref 98–111)
Creatinine, Ser: 1.33 mg/dL — ABNORMAL HIGH (ref 0.61–1.24)
Creatinine, Ser: 1.46 mg/dL — ABNORMAL HIGH (ref 0.61–1.24)
GFR, Estimated: >60 mL/min (ref >60)
GFR, Estimated: 58 mL/min — ABNORMAL LOW (ref >60)
Glucose, Bld: 156 mg/dL — ABNORMAL HIGH (ref 70–99)
Glucose, Bld: 160 mg/dL — ABNORMAL HIGH (ref 70–99)
Potassium: 3.6 mmol/L (ref 3.5–5.1)
Potassium: 4.5 mmol/L (ref 3.5–5.1)
Sodium: 129 mmol/L — ABNORMAL LOW (ref 135–145)
Sodium: 127 mmol/L — ABNORMAL LOW (ref 135–145)

## 2021-01-03 LAB — CBC
HCT: 29.6 % — ABNORMAL LOW (ref 39.0–52.0)
Hemoglobin: 9.9 g/dL — ABNORMAL LOW (ref 13.0–17.0)
MCH: 27.7 pg (ref 26.0–34.0)
MCHC: 33.4 g/dL (ref 30.0–36.0)
MCV: 82.7 fL (ref 80.0–100.0)
Platelets: 198 10*3/uL (ref 150–400)
RBC: 3.58 MIL/uL — ABNORMAL LOW (ref 4.22–5.81)
RDW: 14.4 % (ref 11.5–15.5)
WBC: 10.3 10*3/uL (ref 4.0–10.5)
nRBC: 0 % (ref 0.0–0.2)

## 2021-01-03 LAB — COOXEMETRY PANEL
Carboxyhemoglobin: 1.8 % — ABNORMAL HIGH (ref 0.5–1.5)
Methemoglobin: 1.1 % (ref 0.0–1.5)
O2 Saturation: 59.4 %
Total hemoglobin: 10.2 g/dL — ABNORMAL LOW (ref 12.0–16.0)

## 2021-01-03 LAB — GLUCOSE, CAPILLARY
Glucose-Capillary: 154 mg/dL — ABNORMAL HIGH (ref 70–99)
Glucose-Capillary: 191 mg/dL — ABNORMAL HIGH (ref 70–99)
Glucose-Capillary: 150 mg/dL — ABNORMAL HIGH (ref 70–99)
Glucose-Capillary: 222 mg/dL — ABNORMAL HIGH (ref 70–99)

## 2021-01-03 LAB — MAGNESIUM: Magnesium: 2.1 mg/dL (ref 1.7–2.4)

## 2021-01-03 LAB — HEPARIN LEVEL (UNFRACTIONATED): Heparin Unfractionated: 0.14 IU/mL — ABNORMAL LOW (ref 0.30–0.70)

## 2021-01-03 LAB — LACTATE DEHYDROGENASE: LDH: 391 U/L — ABNORMAL HIGH (ref 98–192)

## 2021-01-03 MED ORDER — POTASSIUM CHLORIDE 10 MEQ/50ML IV SOLN
10.0000 meq | INTRAVENOUS | Status: AC
  Administered 2021-01-03 (×4): 10 meq via INTRAVENOUS
  Filled 2021-01-03 (×4): qty 50

## 2021-01-03 NOTE — Progress Notes (Signed)
Physical Therapy Treatment Patient Details Name: Dylan Chaney MRN: [id number] DOB: 05/30/1970 Today's Date: 01/03/2021   History of Present Illness Pt is a 50 y.o. male admitted 12/17/20 with chest pain, SOB. Chest CT with bilateral PE. Echo showed EF < 20%. Decision made for mechanical support as possible bridge to transplant. Now s/p placement of Impella left ventricular assist device 10/11. S/p impella hematoma evacuation 10/15. PMH includes remote drug and alcohol abuse.    PT Comments    The pt was in good spirits this morning and eager to participate in therapy session. He was able to demo good continued stability and speed with gait, completing x6 speed intervals during hallway ambulation where he showed good improvement in gait speed and ability to change pace. The pt was then able to complete a series of LE strengthening exercises (described below) to improve functional strength and power in addition to continued endurance training with gait. Will continue to benefit from skilled PT acutely to maximize functional independence.    Recommendations for follow up therapy are one component of a multi-disciplinary discharge planning process, led by the attending physician.  Recommendations may be updated based on patient status, additional functional criteria and insurance authorization.  Follow Up Recommendations  Home health PT     Assistance Recommended at Discharge Intermittent Supervision/Assistance  Equipment Recommendations  3in1 (PT);Rollator (4 wheels)    Recommendations for Other Services       Precautions / Restrictions Precautions Precautions: Fall;Other (comment) Precaution Comments: Impella, LIJ CVC Restrictions Weight Bearing Restrictions: Yes RUE Weight Bearing: Partial weight bearing RUE Partial Weight Bearing Percentage or Pounds: R Axillary Impella     Mobility  Bed Mobility Overal bed mobility: Independent             General bed mobility comments:  pt OOB in recliner at start and end of session    Transfers Overall transfer level: Modified independent Equipment used: Rolling walker (2 wheels);None Transfers: Sit to/from Stand Sit to Stand: Modified independent (Device/Increase time);+2 safety/equipment           General transfer comment: pt initially pulling on RW, but once given cues for technique, completed >20 through session with no AD or assist other than line management    Ambulation/Gait Ambulation/Gait assistance: Supervision;+2 safety/equipment Gait Distance (Feet): 195 Feet Assistive device: Rolling walker (2 wheels) Gait Pattern/deviations: Step-through pattern;Decreased stride length;Trunk flexed Gait velocity: 0.44 m/s initially, 0.66 m/s during "speed" intervals, 0.57 m/s self-selected pace at end of session Gait velocity interpretation: 1.31 - 2.62 ft/sec, indicative of limited community ambulator General Gait Details: slow but steady gait speed, pt able to demo small changes in pace with cues. x6 bouts of increased speed intervals during ambulation       Balance Overall balance assessment: Needs assistance Sitting-balance support: Feet supported Sitting balance-Leahy Scale: Good     Standing balance support: No upper extremity supported Standing balance-Leahy Scale: Good Standing balance comment: static and dynamic movements without UE support, able to maintain elongated tandem stance for lunges without UE support, BUE support for gait                            Cognition Arousal/Alertness: Awake/alert Behavior During Therapy: Flat affect Overall Cognitive Status: Within Functional Limits for tasks assessed  General Comments: Feeling better since last therapy session, continues to be motivated        Exercises General Exercises - Lower Extremity Heel Raises: AROM;Both;15 reps;Seated (2 sets) Other Exercises Other Exercises: sit-stand x5  without use of UE (2 sets) Other Exercises: squat from standing with 2 beat pause before returning to stand x5 (2 sets) Other Exercises: standing lunges x5 each leg    General Comments General comments (skin integrity, edema, etc.): VSS on RA. HR 101 bpm max      Pertinent Vitals/Pain Pain Assessment: 0-10 Pain Score: 2  Pain Location: impella site, chest Pain Descriptors / Indicators: Guarding;Tiring;Discomfort Pain Intervention(s): Limited activity within patient's tolerance;Monitored during session;Repositioned     PT Goals (current goals can now be found in the care plan section) Acute Rehab PT Goals Patient Stated Goal: Just get back on my feet PT Goal Formulation: With patient Time For Goal Achievement: 01/05/21 Potential to Achieve Goals: Good Progress towards PT goals: Progressing toward goals    Frequency    Min 3X/week      PT Plan Current plan remains appropriate       AM-PAC PT "6 Clicks" Mobility   Outcome Measure  Help needed turning from your back to your side while in a flat bed without using bedrails?: None Help needed moving from lying on your back to sitting on the side of a flat bed without using bedrails?: None Help needed moving to and from a bed to a chair (including a wheelchair)?: None Help needed standing up from a chair using your arms (e.g., wheelchair or bedside chair)?: None Help needed to walk in hospital room?: A Little Help needed climbing 3-5 steps with a railing? : A Little 6 Click Score: 22    End of Session Equipment Utilized During Treatment: Gait belt Activity Tolerance: Patient tolerated treatment well Patient left: with call bell/phone within reach;with nursing/sitter in room;in chair Nurse Communication: Mobility status PT Visit Diagnosis: Unsteadiness on feet (R26.81);Difficulty in walking, not elsewhere classified (R26.2)     Time: [id number] PT Time Calculation (min) (ACUTE ONLY): 23 min  Charges:  $Gait Training:  8-22 mins $Therapeutic Exercise: 8-22 mins                     Vickki Muff, PT, DPT   Acute Rehabilitation Department Pager #: [phone]   Dylan Chaney 01/03/2021, 10:04 AM

## 2021-01-03 NOTE — Progress Notes (Signed)
CSW met at bedside with patient, sister and his mother. Patient was in good spirits and noted he was feeling a bit improved from last week and stated his family had a lot to do with his improved mood. CSW has not heard anything new regarding pending Medicaid and patient reports he has not had any updates yet either. CSW will follow up with [facility] and follow up with patient if new information obtained. CSW continues to follow. Raquel Sarna, [place], [place]

## 2021-01-03 NOTE — Progress Notes (Signed)
ANTICOAGULATION CONSULT NOTE - Follow Up Consult  Pharmacy Consult for Heparin Indication: Impella/ pulmonary embolus/ Impella  No Known Allergies  Patient Measurements: Height: 6\' 1"  (185.4 cm) Weight: 88.6 kg (195 lb 5.2 oz) IBW/kg (Calculated) : 79.9 Heparin Dosing Weight: 92.1 kg  Vital Signs: Temp: 98.9 F (37.2 C) (10/24 0800) Temp Source: Oral (10/24 0400) Pulse Rate: 96 (10/24 0830)  Labs: Recent Labs    01/01/21 0357 01/01/21 1400 01/02/21 0459 01/02/21 0500 01/03/21 0514  HGB 9.4*  --  9.7*  --  9.9*  HCT 28.2*  --  28.7*  --  29.6*  PLT 181  --  194  --  198  HEPARINUNFRC 0.10*  --   --  0.15* 0.14*  CREATININE 1.32* 1.32* 1.30*  --  1.33*     Estimated Creatinine Clearance: 74.3 mL/min (A) (by C-G formula based on SCr of 1.33 mg/dL (H)).   Medications:  Infusions:   sodium chloride 10 mL/hr at 01/03/21 0800   amiodarone 30 mg/hr (01/03/21 0800)   epinephrine 5 mcg/min (01/03/21 0800)   heparin 50 units/mL (Impella PURGE) in dextrose 5 % 1000 mL bag 10.9 mL/hr at 12/21/20 1100   heparin 600 Units/hr (01/03/21 0800)   milrinone 0.5 mcg/kg/min (01/03/21 0815)   norepinephrine (LEVOPHED) Adult infusion 25 mcg/min (01/03/21 0800)   potassium chloride 10 mEq (01/03/21 01/05/21)     Assessment: 50 yo male with acute bilateral PE, moderate clot burden. Pharmacy consulted to dose heparin drip for PE.  No prior AC noted.  Patient returned from surgery 10/11 s/p impella 5.5 placement. Patient taken to OR for evacuation 10/14. Oozing from impella site has improved. Minor nose bleed 10/19 and 10/23. Systemic heparin remains at a fixed rate of 600 units/hour.   Impella purge providing 555 units/hr of heparin (concentration 50 units/mL, flow 11.1 mL/hr). Heparin level this morning was subtherapeutic at 0.14, on systemic 600 units/hr. Hemoglobin and platelets stable. LDH 391. Will follow heart failure team's plan for heparin dosing in the setting of hematoma.   Goal  of Therapy:  Heparin level 0.3-0.5 units/ml for Impella + PE Monitor platelets by anticoagulation protocol: Yes   Plan:  Continue IV heparin at 600 units/hr Impella purge providing 555 units/hour of heparin for total dose of 1155 units/hour Daily CBC and heparin level Monitor for signs of bleeding  Thank you for allowing pharmacy to participate in this patient's care.  11/23, PharmD PGY1 Pharmacy Resident 01/03/2021 8:54 AM Check [email or web address] for unit specific pharmacy number

## 2021-01-03 NOTE — Progress Notes (Addendum)
Patient ID: Dylan Chaney, male   DOB: [birth year], 50 y.o.   MRN: [id number]    Advanced Heart Failure Rounding Note   Subjective:    - 10/11 Impella 5.5 placed - 12/22/20 Impllea turned up to P9 and milrinone increased to 0.5 mcg. Given dose of tolvaptan.  - 10/14 To OR for Impella site hematoma washout.   Continues on Impella at P-9 Flow 5.0 LDH stable 391. On heparin  Remains On Epi 5, Milrinone 0.5, NE at 25  Co-ox 59%  Yesterday diuresed with IV lasix. Negative 3.4 liters. CVP 16.   Feeling better today. Denies SOB. Denies nausea   Objective:   Weight Range:  Vital Signs:   Temp:  [97.6 F (36.4 C)-98.8 F (37.1 C)] 97.6 F (36.4 C) (10/24 0400) Pulse Rate:  [80-99] 94 (10/24 0645) Resp:  [10-25] 20 (10/24 0700) SpO2:  [76 %-100 %] 100 % (10/24 0700) Weight:  [88.6 kg] 88.6 kg (10/24 0645) Last BM Date: 01/03/21  Weight change: Filed Weights   01/02/21 0500 01/02/21 0630 01/03/21 0645  Weight: 89 kg 89 kg 88.6 kg    Intake/Output:   Intake/Output Summary (Last 24 hours) at 01/03/2021 0704 Last data filed at 01/03/2021 0700 Gross per 24 hour  Intake 3613.18 ml  Output 7100 ml  Net -3486.82 ml  CVP 16 personally checked.  PHYSICAL EXAM: General:  Sitting in the chair.  No resp difficulty HEENT: normal Neck: supple. JVP to the jaw.  Carotids 2+ bilat; no bruits. No lymphadenopathy or thryomegaly appreciated. Cor: PMI nondisplaced. Regular rate & rhythm. No rubs, gallops or murmurs.R Axillary Impella.  Lungs: Decreased LLL Abdomen: soft, nontender, nondistended. No hepatosplenomegaly. No bruits or masses. Good bowel sounds. Extremities: no cyanosis, clubbing, rash, R and LLE 2+ edema with unna boots.  Neuro: alert & orientedx3, cranial nerves grossly intact. moves all 4 extremities w/o difficulty. Affect pleasant anial nerves grossly intact. moves all 4 extremities w/o difficulty. Affect pleasant   Telemetry: SR with a few runs of NSVT  Labs: Basic  Metabolic Panel: Recent Labs  Lab 12/30/20 0233 12/31/20 0311 01/01/21 0357 01/01/21 1400 01/02/21 0459 01/03/21 0514  NA 126* 125* 126* 128* 128* 129*  K 3.9 3.6 3.1* 3.1* 3.7 3.6  CL 88* 89* 88* 91* 89* 89*  CO2 25 25 25 27 28 30   GLUCOSE 154* 181* 130* 160* 169* 156*  BUN 24* 23* 24* 23* 24* 22*  CREATININE 1.28* 1.29* 1.32* 1.32* 1.30* 1.33*  CALCIUM 8.7* 8.5* 8.6* 8.2* 8.8* 8.9  MG 1.9 2.3 2.2  --  2.1 2.1    Liver Function Tests: No results for input(s): AST, ALT, ALKPHOS, BILITOT, PROT, ALBUMIN in the last 168 hours.  No results for input(s): LIPASE, AMYLASE in the last 168 hours. No results for input(s): AMMONIA in the last 168 hours.  CBC: Recent Labs  Lab 12/30/20 0233 12/31/20 0311 01/01/21 0357 01/02/21 0459 01/03/21 0514  WBC 11.0* 11.5* 12.3* 11.2* 10.3  HGB 9.8* 9.7* 9.4* 9.7* 9.9*  HCT 28.4* 28.8* 28.2* 28.7* 29.6*  MCV 80.9 81.6 83.7 82.0 82.7  PLT 177 185 181 194 198    Cardiac Enzymes: No results for input(s): CKTOTAL, CKMB, CKMBINDEX, TROPONINI in the last 168 hours.   BNP: BNP (last 3 results) Recent Labs    12/15/20 1716 12/17/20 0457  BNP 2,024.7* 1,742.2*    ProBNP (last 3 results) No results for input(s): PROBNP in the last 8760 hours.    Other results:  Imaging: No  results found.   Medications:     Scheduled Medications:  (feeding supplement) PROSource Plus  30 mL Oral TID BM   aspirin EC  81 mg Oral Daily   Chlorhexidine Gluconate Cloth  6 each Topical Daily   digoxin  0.125 mg Oral Daily   docusate sodium  100 mg Oral BID   feeding supplement  1 Container Oral TID BM   furosemide  80 mg Intravenous BID   insulin aspart  0-15 Units Subcutaneous TID WC   insulin aspart  0-5 Units Subcutaneous QHS   insulin aspart  3 Units Subcutaneous TID WC   insulin glargine-yfgn  24 Units Subcutaneous Daily   metoCLOPramide (REGLAN) injection  10 mg Intravenous Q8H   multivitamin with minerals  1 tablet Oral Daily    pantoprazole  40 mg Oral Daily   polyethylene glycol  17 g Oral Daily   potassium chloride  40 mEq Oral BID   scopolamine  1 patch Transdermal Q72H   sodium chloride flush  10-40 mL Intracatheter Q12H   spironolactone  25 mg Oral Daily   tamsulosin  0.4 mg Oral QPC supper    Infusions:  sodium chloride 10 mL/hr at 01/03/21 0700   amiodarone 30 mg/hr (01/03/21 0700)   epinephrine 5 mcg/min (01/03/21 0700)   heparin 50 units/mL (Impella PURGE) in dextrose 5 % 1000 mL bag 10.9 mL/hr at 12/21/20 1100   heparin 600 Units/hr (01/03/21 0700)   milrinone 0.5 mcg/kg/min (01/03/21 0700)   norepinephrine (LEVOPHED) Adult infusion 25 mcg/min (01/03/21 0700)    PRN Medications: sodium chloride, acetaminophen, bisacodyl, lip balm, oxyCODONE, sodium chloride flush, traMADol   Assessment/Plan:   1. 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 Will need coronary angio eventually - Impella 5.5 placed 10/11.  Speed increased to P9. Would continue current speed. Clear urine.  LDH stable at 386 - Echo 10.23 RV function moderately reduced. Impella position ok 3.6 cm - Remains on milrinone 0.5 mcg + norepi 25 mcg + Epi 5 - CVP -16. Continue IV lasix . Refusing oral potassium. Give 4 runs K today.  - He refuses Protek Duo support for RV (does not want 2 pumps). I discussed with him multiple times - Continue  25 mg spiro daily.  .- Given social situation advanced therapies would be challenging but if he does not recover with medical therapy, he has extensive support from [facility]. Continue to f/u with medicaid application for transplant referral  - HCV quant is negative  - Blood type B+ - We spoke with Dr. Edwena Blow at [facility]. They would consider him for transplant evaluation (will need Medicaid  first).    2. Acute PE  - CTA + bilateral segmental/subsegmental PE likely due to low output - continue heparin  3. Acute blood loss anemia - bleeding from Impella site on 10/11 => heparin stopped 10/14 and patient had hematoma evacuation by Dr. Dorris Fetch => heparin restarted.    - On heparin gtt for PE + Impella  - Hgb stable 9.9 today. Monitor  - d/w Dr. Dorris Fetch   4. Shock liver - improving with hemodynamic support   5. AKI on CKD 3B - baseline SCr 1.8-2.0 - due to ATN from shock  - Creatinine normalized.  Creatinine 1.3  6. Thrombocytopenia - resolved - HIT panel negative.  - SRA negative    - Doubt significant hemolysis at this point (stable LDH and rising plts), no evidence for HIT.    7. HCV - viral RNA Quant negative   8. 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 Hypervolemic Hyponatremia - 10/12, 10/14 Tolvaptan  - Na 129 today - Restrict free water.   10. NSVT - continue amio gtt while on inotropes - keep K > 4.0 and Mg >2.0   11. Constipation  - Resolved w/ reglan and sorbitol   12. [facility] needs - Dr Gala Romney spoke with his caseworker April Anders [phone]). He was incarcerated almost a decade ago but not since. He has been homeless for years but recently moved into a hotel in June through the [facility]. 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 No substance use. Always first to volunteer to help other [employer] members move and do other tasks. [employer] members are very attached to him and said they would drive him back and forth to [facility] for transplant eval,if needed. Other IRC contact: Brother John: [phone]. His sister and his mother have been in to see and support him.   Await Medicaid. Once he has insurance consider transfer to [facility] for transplant eval. I am concerned that we  may be running out of time due to RV failure but he refuses RV mechanical support.   Ambulate today.   Tonye Becket, NP  7:04 AM  Patient seen and examined with the above-signed Advanced Practice Provider and/or Housestaff. I personally reviewed laboratory data, imaging studies and relevant notes. I independently examined the patient and formulated the important aspects of the plan. I have edited the note to reflect any of my changes or salient points. I have personally discussed the plan with the patient and/or family.  Remains on Impella 5.5 + Epi/NE/milrinone. Co-ox 59%. CVP 16. Diuresing well.   Remains on heparin.   Breathing better. Was able to ambulate some.   General: Sitting in chair HEENT: normal Neck: supple. JVP to ear Carotids 2+ bilat; no bruits. No lymphadenopathy or thryomegaly appreciated. Cor: PMI nondisplaced. Regular rate & rhythm. 2/6 TR Lungs: clear Abdomen: soft, nontender, nondistended. No hepatosplenomegaly. No bruits or masses. Good bowel sounds. Extremities: no cyanosis, clubbing, rash, 1+ edema Neuro: alert & orientedx3, cranial nerves grossly intact. moves all 4 extremities w/o difficulty. Affect pleasant  Remains tenuous. Co-ox improved on current regimen. CVP improving. Will continue current regimen.   Hopefully Medicaid will come through soon and we can get him to [facility] for transplant eval.   Impella parameters stable.   CRITICAL CARE Performed by: Arvilla Meres  Total critical care time: 45 minutes  Critical care time was exclusive of separately billable procedures and treating other patients.  Critical care was necessary to treat or prevent imminent or life-threatening deterioration.  Critical care was time spent personally by me (independent of midlevel providers or residents) on the following activities: development of treatment plan with patient and/or surrogate as well as nursing, discussions with consultants, evaluation of patient's  response to treatment, examination of patient, obtaining history from patient or surrogate, ordering and performing treatments and interventions, ordering and review of laboratory studies, ordering and review of radiographic studies, pulse oximetry and re-evaluation  of patient's condition.  Arvilla Meres, MD  8:20 AM

## 2021-01-03 NOTE — Plan of Care (Signed)
  Problem: Education: Goal: Knowledge of General Education information will improve Description: Including pain rating scale, medication(s)/side effects and non-pharmacologic comfort measures 01/03/2021 0834 by Flora Lipps, RN Outcome: Progressing 01/03/2021 0834 by Flora Lipps, RN Outcome: Progressing   Problem: Health Behavior/Discharge Planning: Goal: Ability to manage health-related needs will improve 01/03/2021 0834 by Flora Lipps, RN Outcome: Progressing 01/03/2021 0834 by Flora Lipps, RN Outcome: Progressing   Problem: Clinical Measurements: Goal: Respiratory complications will improve 01/03/2021 0834 by Flora Lipps, RN Outcome: Progressing 01/03/2021 0834 by Flora Lipps, RN Outcome: Progressing   Problem: Activity: Goal: Risk for activity intolerance will decrease 01/03/2021 0834 by Flora Lipps, RN Outcome: Progressing 01/03/2021 0834 by Flora Lipps, RN Outcome: Progressing   Problem: Nutrition: Goal: Adequate nutrition will be maintained 01/03/2021 0834 by Flora Lipps, RN Outcome: Progressing 01/03/2021 0834 by Flora Lipps, RN Outcome: Progressing   Problem: Elimination: Goal: Will not experience complications related to bowel motility Outcome: Progressing Goal: Will not experience complications related to urinary retention Outcome: Progressing   Problem: Safety: Goal: Ability to remain free from injury will improve Outcome: Progressing

## 2021-01-04 LAB — BASIC METABOLIC PANEL
Anion gap: 9 (ref 5–15)
BUN: 32 mg/dL — ABNORMAL HIGH (ref 6–20)
CO2: 29 mmol/L (ref 22–32)
Calcium: 8.8 mg/dL — ABNORMAL LOW (ref 8.9–10.3)
Chloride: 89 mmol/L — ABNORMAL LOW (ref 98–111)
Creatinine, Ser: 1.47 mg/dL — ABNORMAL HIGH (ref 0.61–1.24)
GFR, Estimated: 57 mL/min — ABNORMAL LOW (ref >60)
Glucose, Bld: 128 mg/dL — ABNORMAL HIGH (ref 70–99)
Potassium: 4 mmol/L (ref 3.5–5.1)
Sodium: 127 mmol/L — ABNORMAL LOW (ref 135–145)

## 2021-01-04 LAB — CBC
HCT: 29.3 % — ABNORMAL LOW (ref 39.0–52.0)
Hemoglobin: 9.6 g/dL — ABNORMAL LOW (ref 13.0–17.0)
MCH: 27.1 pg (ref 26.0–34.0)
MCHC: 32.8 g/dL (ref 30.0–36.0)
MCV: 82.8 fL (ref 80.0–100.0)
Platelets: 204 10*3/uL (ref 150–400)
RBC: 3.54 MIL/uL — ABNORMAL LOW (ref 4.22–5.81)
RDW: 14.4 % (ref 11.5–15.5)
WBC: 10 10*3/uL (ref 4.0–10.5)
nRBC: 0 % (ref 0.0–0.2)

## 2021-01-04 LAB — MAGNESIUM: Magnesium: 2 mg/dL (ref 1.7–2.4)

## 2021-01-04 LAB — COOXEMETRY PANEL
Carboxyhemoglobin: 1.6 % — ABNORMAL HIGH (ref 0.5–1.5)
Carboxyhemoglobin: 1.6 % — ABNORMAL HIGH (ref 0.5–1.5)
Carboxyhemoglobin: 1.6 % — ABNORMAL HIGH (ref 0.5–1.5)
Methemoglobin: 0.9 % (ref 0.0–1.5)
Methemoglobin: 0.9 % (ref 0.0–1.5)
Methemoglobin: 0.9 % (ref 0.0–1.5)
O2 Saturation: 44.2 %
O2 Saturation: 44.4 %
O2 Saturation: 48.4 %
Total hemoglobin: 9.9 g/dL — ABNORMAL LOW (ref 12.0–16.0)
Total hemoglobin: 10.5 g/dL — ABNORMAL LOW (ref 12.0–16.0)
Total hemoglobin: 10.1 g/dL — ABNORMAL LOW (ref 12.0–16.0)

## 2021-01-04 LAB — GLUCOSE, CAPILLARY
Glucose-Capillary: 151 mg/dL — ABNORMAL HIGH (ref 70–99)
Glucose-Capillary: 213 mg/dL — ABNORMAL HIGH (ref 70–99)
Glucose-Capillary: 178 mg/dL — ABNORMAL HIGH (ref 70–99)
Glucose-Capillary: 158 mg/dL — ABNORMAL HIGH (ref 70–99)
Glucose-Capillary: 249 mg/dL — ABNORMAL HIGH (ref 70–99)

## 2021-01-04 LAB — HEPARIN LEVEL (UNFRACTIONATED): Heparin Unfractionated: <0.1 IU/mL — ABNORMAL LOW (ref 0.30–0.70)

## 2021-01-04 LAB — LACTATE DEHYDROGENASE: LDH: 397 U/L — ABNORMAL HIGH (ref 98–192)

## 2021-01-04 MED ORDER — POTASSIUM CHLORIDE 10 MEQ/50ML IV SOLN
10.0000 meq | INTRAVENOUS | Status: AC
  Administered 2021-01-04 (×2): 10 meq via INTRAVENOUS
  Filled 2021-01-04 (×2): qty 50

## 2021-01-04 MED ORDER — FUROSEMIDE 10 MG/ML IJ SOLN
80.0000 mg | Freq: Every day | INTRAMUSCULAR | Status: DC
  Administered 2021-01-04 – 2021-01-05 (×2): 80 mg via INTRAVENOUS
  Filled 2021-01-04 (×2): qty 8

## 2021-01-04 MED ORDER — LORATADINE 10 MG PO TABS
10.0000 mg | ORAL_TABLET | Freq: Every day | ORAL | Status: DC
  Administered 2021-01-04 – 2021-01-05 (×2): 10 mg via ORAL
  Filled 2021-01-04 (×2): qty 1

## 2021-01-04 NOTE — Progress Notes (Signed)
Patient ID: Dylan Chaney, male   DOB: 1970-06-24, 50 y.o.   MRN: [id number]    Advanced Heart Failure Rounding Note   Subjective:    - 10/11 Impella 5.5 placed - 12/22/20 Impllea turned up to P9 and milrinone increased to 0.5 mcg. Given dose of tolvaptan.  - 10/14 To OR for Impella site hematoma washout.   Continues on Impella at P-9 Flow 4.9 LDH stable 391. On heparin  Remains On Epi 5, Milrinone 0.5, NE weaned down overnight  25 -> 16 Co-ox 59%-> 44% Maps in 50-60s this am.   Remains on IV lasix. Weight down another pound. CVP 14-15   SCc 1.2 -> 1.3 -> 1.46 -> 1.47   Objective:   Weight Range:  Vital Signs:   Temp:  [97.9 F (36.6 C)-98.9 F (37.2 C)] 97.9 F (36.6 C) (10/25 0400) Pulse Rate:  [81-105] 91 (10/25 0530) Resp:  [12-27] 13 (10/25 0530) SpO2:  [86 %-100 %] 96 % (10/25 0530) Weight:  [88.6 kg] 88.6 kg (10/24 0645) Last BM Date: 01/03/21  Weight change: Filed Weights   01/02/21 0500 01/02/21 0630 01/03/21 0645  Weight: 89 kg 89 kg 88.6 kg    Intake/Output:   Intake/Output Summary (Last 24 hours) at 01/04/2021 0633 Last data filed at 01/04/2021 0500 Gross per 24 hour  Intake 3582.7 ml  Output 4725 ml  Net -1142.3 ml   CVP 14 personally checked.  PHYSICAL EXAM: General:  Sitting in chair No resp difficulty HEENT: normal Neck: supple. JVP to jaw  Carotids 2+ bilat; no bruits. No lymphadenopathy or thryomegaly appreciated. Cor: PMI nondisplaced. Regular rate & rhythm. 2/6 TR  Impella site ok  Lungs: clear Abdomen: soft, nontender, nondistended. No hepatosplenomegaly. No bruits or masses. Good bowel sounds. Extremities: no cyanosis, clubbing, rash, 1+ edema wrapped Neuro: alert & orientedx3, cranial nerves grossly intact. moves all 4 extremities w/o difficulty. Affect pleasant  Telemetry: SR 90s Personally reviewed   Labs: Basic Metabolic Panel: Recent Labs  Lab 12/31/20 0311 01/01/21 0357 01/01/21 1400 01/02/21 0459 01/03/21 0514  01/03/21 1330 01/04/21 0350  NA 125* 126* 128* 128* 129* 127* 127*  K 3.6 3.1* 3.1* 3.7 3.6 4.5 4.0  CL 89* 88* 91* 89* 89* 88* 89*  CO2 25 25 27 28 30 29 29   GLUCOSE 181* 130* 160* 169* 156* 160* 128*  BUN 23* 24* 23* 24* 22* 26* 32*  CREATININE 1.29* 1.32* 1.32* 1.30* 1.33* 1.46* 1.47*  CALCIUM 8.5* 8.6* 8.2* 8.8* 8.9 9.1 8.8*  MG 2.3 2.2  --  2.1 2.1  --  2.0     Liver Function Tests: No results for input(s): AST, ALT, ALKPHOS, BILITOT, PROT, ALBUMIN in the last 168 hours.  No results for input(s): LIPASE, AMYLASE in the last 168 hours. No results for input(s): AMMONIA in the last 168 hours.  CBC: Recent Labs  Lab 12/31/20 0311 01/01/21 0357 01/02/21 0459 01/03/21 0514 01/04/21 0350  WBC 11.5* 12.3* 11.2* 10.3 10.0  HGB 9.7* 9.4* 9.7* 9.9* 9.6*  HCT 28.8* 28.2* 28.7* 29.6* 29.3*  MCV 81.6 83.7 82.0 82.7 82.8  PLT 185 181 194 198 204     Cardiac Enzymes: No results for input(s): CKTOTAL, CKMB, CKMBINDEX, TROPONINI in the last 168 hours.   BNP: BNP (last 3 results) Recent Labs    12/15/20 1716 12/17/20 0457  BNP 2,024.7* 1,742.2*     ProBNP (last 3 results) No results for input(s): PROBNP in the last 8760 hours.    Other results:  Imaging: No results found.   Medications:     Scheduled Medications:  (feeding supplement) PROSource Plus  30 mL Oral TID BM   aspirin EC  81 mg Oral Daily   Chlorhexidine Gluconate Cloth  6 each Topical Daily   digoxin  0.125 mg Oral Daily   docusate sodium  100 mg Oral BID   feeding supplement  1 Container Oral TID BM   furosemide  80 mg Intravenous BID   insulin aspart  0-15 Units Subcutaneous TID WC   insulin aspart  0-5 Units Subcutaneous QHS   insulin aspart  3 Units Subcutaneous TID WC   insulin glargine-yfgn  24 Units Subcutaneous Daily   metoCLOPramide (REGLAN) injection  10 mg Intravenous Q8H   multivitamin with minerals  1 tablet Oral Daily   pantoprazole  40 mg Oral Daily   polyethylene glycol  17  g Oral Daily   potassium chloride  40 mEq Oral BID   scopolamine  1 patch Transdermal Q72H   sodium chloride flush  10-40 mL Intracatheter Q12H   spironolactone  25 mg Oral Daily   tamsulosin  0.4 mg Oral QPC supper    Infusions:  sodium chloride 10 mL/hr at 01/04/21 0500   amiodarone 30 mg/hr (01/04/21 0500)   epinephrine 5 mcg/min (01/04/21 0500)   heparin 50 units/mL (Impella PURGE) in dextrose 5 % 1000 mL bag 10.9 mL/hr at 12/21/20 1100   heparin 600 Units/hr (01/04/21 0500)   milrinone 0.5 mcg/kg/min (01/04/21 0500)   norepinephrine (LEVOPHED) Adult infusion 14 mcg/min (01/04/21 0500)    PRN Medications: sodium chloride, acetaminophen, bisacodyl, lip balm, oxyCODONE, sodium chloride flush, traMADol   Assessment/Plan:   1. 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 Will need coronary angio eventually - Impella 5.5 placed 10/11.  Speed increased to P9. Would continue current speed. Clear urine.  LDH stable at 397. Having purge flow alarms. Reviewed with Impella rep. Felt to be stable Will check position by echo today - Echo 10/23 RV function moderately reduced. Impella position ok 3.6 cm - Remains on milrinone 0.5 mcg +  Epi 5 + NE which was weaned from 25 -> 16 overnight. MAP and co-ox low this am. Would try to avoid rapid weans. I increased back to 24 this am. Repeat co-ox later this am  - CVP -14-15 I am going to decrease lasix to 80 IV daily today to give kidneys a break  - He refuses Protek Duo support for RV (does not want 2 pumps). I discussed with him multiple times - Continue  25 mg spiro daily.  .- Given social situation advanced therapies would be challenging but if he does not recover with medical therapy, he has extensive support from [facility]. Continue  to f/u with medicaid application for transplant referral  - HCV quant is negative  - Blood type B+ - We spoke with Dr. Edwena Blow at [facility]. They would consider him for transplant evaluation (will need Medicaid first).  - D/w SW team. Medicaid still pending. I will f/u today   2. Acute PE  - CTA + bilateral segmental/subsegmental PE likely due to low output - continue heparin. No bleeding  3. Acute blood loss anemia - bleeding from Impella site on 10/11 => heparin stopped 10/14  and patient had hematoma evacuation by Dr. Dorris Fetch => heparin restarted.    - On heparin gtt for PE + Impella  - Hgb stable 9.6 today. Monitor    4. Shock liver - improving with hemodynamic support   5. AKI on CKD 3B - baseline SCr 1.8-2.0 - due to ATN from shock  - Creatinine increasing slowly. Creatinine 1.3 -> 1.4  - Decrease lasix. Keep MAP > 70  6. Thrombocytopenia - resolved - HIT panel negative.  - SRA negative    - Doubt significant hemolysis at this point (stable LDH and rising plts), no evidence for HIT.    7. HCV - viral RNA Quant negative   8. 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 Hypervolemic Hyponatremia - 10/12, 10/14 Tolvaptan  - Na 127 today - Restrict free water.   10. NSVT - continue amio gtt while on inotropes - keep K > 4.0 and Mg >2.0   11. Constipation  - Resolved w/ reglan and sorbitol   12. [facility]DoH needs - Dr Gala Romney spoke with his caseworker April Anders [phone]). He was incarcerated almost a decade ago but not since. He has been homeless for years but recently moved into a hotel in June through the [facility]. 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 No substance use. Always first to volunteer to help other [employer] members move and do other tasks. IRC members are very attached to him and  said they would drive him back and forth to [facility] for transplant eval,if needed. Other IRC contact: Brother John: [phone]. His sister and his mother have been in to see and support him.   Await Medicaid. Once he has insurance consider transfer to [facility] for transplant eval. I am concerned that we may be running out of time due to RV failure but he refuses RV mechanical support.   CRITICAL CARE Performed by: Arvilla Meres  Total critical care time: 45 minutes  Critical care time was exclusive of separately billable procedures and treating other patients.  Critical care was necessary to treat or prevent imminent or life-threatening deterioration.  Critical care was time spent personally by me (independent of midlevel providers or residents) on the following activities: development of treatment plan with patient and/or surrogate as well as nursing, discussions with consultants, evaluation of patient's response to treatment, examination of patient, obtaining history from patient or surrogate, ordering and performing treatments and interventions, ordering and review of laboratory studies, ordering and review of radiographic studies, pulse oximetry and re-evaluation of patient's condition.   Arvilla Meres, MD  6:33 AM

## 2021-01-04 NOTE — Progress Notes (Signed)
      [address]       [name] [id number]             [phone]      Taking a bath  Occasional shooting pain down right arm  Overall feels better   Impella site ok, p9 with 5 L flow  BP (!) 72/34 (BP Location: Left Arm) Comment: not accurate, trending Impella MAP  Pulse 92   Temp 98 F (36.7 C) (Oral)   Resp (!) 28   Ht 6\' 1"  (1.854 m)   Wt 89.9 kg   SpO2 99%   BMI 26.15 kg/m   CVP= 19   Intake/Output Summary (Last 24 hours) at 01/04/2021 1819 Last data filed at 01/04/2021 1800 Gross per 24 hour  Intake 3913.99 ml  Output 5420 ml  Net -1506.01 ml   Still on multiple inotropes  01/06/2021 C. Viviann Spare, MD [facility] [phone]

## 2021-01-04 NOTE — Progress Notes (Signed)
Nutrition Follow-up  DOCUMENTATION CODES:   Severe malnutrition in context of chronic illness  INTERVENTION:    - Continue Boost Breeze po TID, each supplement provides 250 kcal and 9 grams of protein   - Continue ProSource Plus po TID, each supplement provides 100 kcals and 15 grams protein   - Continue MVI with minerals   - Continue double protein portions on meal trays  NUTRITION DIAGNOSIS:   Severe Malnutrition related to chronic illness (PE, CHF, renal insufficiency) as evidenced by moderate fat depletion, moderate muscle depletion, percent weight loss, energy intake < 75% for > or equal to 1 month.  Ongoing, being addressed via supplements  GOAL:   Patient will meet greater than or equal to 90% of their needs  Progressing  MONITOR:   PO intake, Supplement acceptance, Weight trends  REASON FOR ASSESSMENT:   Rounds (poor PO intake)    ASSESSMENT:   Pt admitted 10/6 with SOB, chest and abdominal pain x3 months . 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 PMH includes remote drug and alcohol abuse, prostate enlargement, and renal insufficiency.  10/14 - OR for Impella site hematoma washout  Pt is pending transfer to [facility] for transplant evaluation.   Spoke with RN prior to entering pt's room. RN reports pt with good appetite and PO intake. Pt taking ProSource Plus but did not want Boost Breeze this morning.  Spoke with pt at bedside. Pt endorses good appetite and PO intake. He reports consuming 100% of breakfast meal that included double portions of eggs and sausage among other items. Pt is consistently consuming ProSource Plus TID and drinks 1-2 Boost Breeze daily. Encouraged pt to consume at least 2 Boost Breeze daily for added calories and protein. Pt in agreement.  Admit weight: 92.1 kg Current weight: 89.9 kg  Meal Completion: 90-100% x last 6 meals  Medications reviewed and  include: ProSource Plus TID, colace, Boost Breeze TID, IV lasix, SSI, novolog 3 units TID with meals, semglee 24 units daily, IV reglan 10 mg q 8 hours, MVI with minerals, protonix, miralax, klor-con 40 mEq BID, scopolamine patch, spironolactone  Drips: amiodarone, epinephrine, heparin, milrinone, levophed  Labs reviewed: sodium 127, BUN 32, creatinine 1.47, hemoglobin 9.6 CBG's: 150-222 x 24 hours  UOP: 4725 ml x 24 hours I/O's: -29.8 L since admit  Diet Order:   Diet Order             Diet heart healthy/carb modified Room service appropriate? Yes; Fluid consistency: Thin; Fluid restriction: 1800 mL Fluid  Diet effective now                   EDUCATION NEEDS:   Education needs have been addressed  Skin:  Skin Assessment: Skin Integrity Issues: Incisions: R chest  Last BM:  01/04/21 small type 4  Height:   Ht Readings from Last 1 Encounters:  12/17/20 6\' 1"  (1.854 m)    Weight:   Wt Readings from Last 1 Encounters:  01/04/21 89.9 kg    BMI:  Body mass index is 26.15 kg/m.  Estimated Nutritional Needs:   Kcal:  2400-2700  Protein:  120-135  Fluid:  >2.4L    01/06/21, MS, RD, LDN Inpatient Clinical Dietitian Please see AMiON for contact information.

## 2021-01-04 NOTE — Progress Notes (Signed)
  Impella P9  Flow 5.1  Remains on milrinone 0.5 mcg + epi 5 mcg + norepi 32 mcg + amio drip.   No complaints. Walked 400 feet earlier.   Nafis Farnan NP-C  3:10 PM

## 2021-01-04 NOTE — Progress Notes (Addendum)
ANTICOAGULATION CONSULT NOTE - Follow Up Consult  Pharmacy Consult for Heparin Indication: Impella/ pulmonary embolus/ Impella  No Known Allergies  Patient Measurements: Height: 6\' 1"  (185.4 cm) Weight: 89.9 kg (198 lb 3.1 oz) IBW/kg (Calculated) : 79.9 Heparin Dosing Weight: 92.1 kg  Vital Signs: Temp: 98.4 F (36.9 C) (10/25 1100) Temp Source: Oral (10/25 0730) Pulse Rate: 92 (10/25 1500)  Labs: Recent Labs    01/02/21 0459 01/02/21 0500 01/03/21 0514 01/03/21 1330 01/04/21 0350  HGB 9.7*  --  9.9*  --  9.6*  HCT 28.7*  --  29.6*  --  29.3*  PLT 194  --  198  --  204  HEPARINUNFRC  --  0.15* 0.14*  --  <0.10*  CREATININE 1.30*  --  1.33* 1.46* 1.47*     Estimated Creatinine Clearance: 67.2 mL/min (A) (by C-G formula based on SCr of 1.47 mg/dL (H)).   Medications:  Infusions:   sodium chloride 10 mL/hr at 01/04/21 1500   amiodarone 30 mg/hr (01/04/21 1508)   epinephrine 5 mcg/min (01/04/21 1500)   heparin 50 units/mL (Impella PURGE) in dextrose 5 % 1000 mL bag 10.9 mL/hr at 12/21/20 1100   heparin 600 Units/hr (01/04/21 1500)   milrinone 0.5 mcg/kg/min (01/04/21 1500)   norepinephrine (LEVOPHED) Adult infusion 32 mcg/min (01/04/21 1500)   potassium chloride 10 mEq (01/04/21 1512)     Assessment: 50 yo male with acute bilateral PE, moderate clot burden. Pharmacy consulted to dose heparin drip for PE.  No prior AC noted.  Patient returned from surgery 10/11 s/p impella 5.5 placement. Patient taken to OR for evacuation 10/14. Oozing from impella site has improved. Minor nose bleed 10/19 and 10/23. Systemic heparin remains at a fixed rate of 600 units/hour.   Impella purge providing 560 units/hr of heparin (concentration 50 units/mL, flow 11.2 mL/hr). Heparin level this morning was subtherapeutic at <0.14, on systemic 600 units/hr. Hemoglobin and platelets stable. No bleeding reported. Will follow heart failure team's plan for heparin dosing in the setting of  hematoma resolution.  Goal of Therapy:  Heparin level 0.3-0.5 units/ml for Impella + PE Monitor platelets by anticoagulation protocol: Yes   Plan:  Continue IV heparin at 600 units/hr Impella purge providing 560 units/hour of heparin for total dose of 1160 units/hour Daily CBC and heparin level Monitor for signs of bleeding  Thank you for allowing pharmacy to participate in this patient's care.  11/23, PharmD PGY1 Pharmacy Resident 01/04/2021 3:43 PM Check [email or web address] for unit specific pharmacy number

## 2021-01-05 LAB — BASIC METABOLIC PANEL
Anion gap: 7 (ref 5–15)
BUN: 30 mg/dL — ABNORMAL HIGH (ref 6–20)
CO2: 27 mmol/L (ref 22–32)
Calcium: 8.7 mg/dL — ABNORMAL LOW (ref 8.9–10.3)
Chloride: 94 mmol/L — ABNORMAL LOW (ref 98–111)
Creatinine, Ser: 1.32 mg/dL — ABNORMAL HIGH (ref 0.61–1.24)
GFR, Estimated: >60 mL/min (ref >60)
Glucose, Bld: 117 mg/dL — ABNORMAL HIGH (ref 70–99)
Potassium: 4.3 mmol/L (ref 3.5–5.1)
Sodium: 128 mmol/L — ABNORMAL LOW (ref 135–145)

## 2021-01-05 LAB — COOXEMETRY PANEL
Carboxyhemoglobin: 1.4 % (ref 0.5–1.5)
Methemoglobin: 0.9 % (ref 0.0–1.5)
O2 Saturation: 46.3 %
Total hemoglobin: 10.9 g/dL — ABNORMAL LOW (ref 12.0–16.0)

## 2021-01-05 LAB — CBC
HCT: 31.4 % — ABNORMAL LOW (ref 39.0–52.0)
Hemoglobin: 10.3 g/dL — ABNORMAL LOW (ref 13.0–17.0)
MCH: 27.5 pg (ref 26.0–34.0)
MCHC: 32.8 g/dL (ref 30.0–36.0)
MCV: 84 fL (ref 80.0–100.0)
Platelets: 210 10*3/uL (ref 150–400)
RBC: 3.74 MIL/uL — ABNORMAL LOW (ref 4.22–5.81)
RDW: 14.7 % (ref 11.5–15.5)
WBC: 10.4 10*3/uL (ref 4.0–10.5)
nRBC: 0 % (ref 0.0–0.2)

## 2021-01-05 LAB — RESP PANEL BY RT-PCR (FLU A&B, COVID) ARPGX2
Influenza A by PCR: NEGATIVE
Influenza B by PCR: NEGATIVE
SARS Coronavirus 2 by RT PCR: NEGATIVE

## 2021-01-05 LAB — GLUCOSE, CAPILLARY
Glucose-Capillary: 137 mg/dL — ABNORMAL HIGH (ref 70–99)
Glucose-Capillary: 163 mg/dL — ABNORMAL HIGH (ref 70–99)
Glucose-Capillary: 156 mg/dL — ABNORMAL HIGH (ref 70–99)
Glucose-Capillary: 239 mg/dL — ABNORMAL HIGH (ref 70–99)
Glucose-Capillary: 195 mg/dL — ABNORMAL HIGH (ref 70–99)

## 2021-01-05 LAB — LACTATE DEHYDROGENASE: LDH: 424 U/L — ABNORMAL HIGH (ref 98–192)

## 2021-01-05 LAB — MAGNESIUM: Magnesium: 2.1 mg/dL (ref 1.7–2.4)

## 2021-01-05 LAB — HEPARIN LEVEL (UNFRACTIONATED): Heparin Unfractionated: <0.1 IU/mL — ABNORMAL LOW (ref 0.30–0.70)

## 2021-01-05 MED ORDER — OXYCODONE HCL 5 MG PO TABS: 5.0000 mg | ORAL_TABLET | Freq: Two times a day (BID) | ORAL | 0 refills | Status: AC | PRN

## 2021-01-05 MED ORDER — CHLORHEXIDINE GLUCONATE CLOTH 2 % EX PADS: 6.0000 | MEDICATED_PAD | Freq: Every day | CUTANEOUS | Status: AC

## 2021-01-05 MED ORDER — BISACODYL 5 MG PO TBEC: 10.0000 mg | DELAYED_RELEASE_TABLET | Freq: Every day | ORAL | 0 refills | Status: AC | PRN

## 2021-01-05 MED ORDER — LIP MEDEX EX OINT: TOPICAL_OINTMENT | CUTANEOUS | 0 refills | Status: AC | PRN

## 2021-01-05 MED ORDER — ACETAMINOPHEN 325 MG PO TABS: 650.0000 mg | ORAL_TABLET | Freq: Four times a day (QID) | ORAL | Status: AC | PRN

## 2021-01-05 MED ORDER — INSULIN ASPART 100 UNIT/ML IJ SOLN: 0.0000 [IU] | Freq: Every day | INTRAMUSCULAR | 11 refills | Status: AC

## 2021-01-05 MED ORDER — LORATADINE 10 MG PO TABS: 10.0000 mg | ORAL_TABLET | Freq: Every day | ORAL | Status: AC

## 2021-01-05 MED ORDER — INSULIN ASPART 100 UNIT/ML IJ SOLN: 3.0000 [IU] | Freq: Three times a day (TID) | INTRAMUSCULAR | 11 refills | Status: AC

## 2021-01-05 MED ORDER — METOCLOPRAMIDE HCL 5 MG/ML IJ SOLN: 10.0000 mg | Freq: Three times a day (TID) | INTRAMUSCULAR | 0 refills | Status: AC

## 2021-01-05 MED ORDER — SCOPOLAMINE 1 MG/3DAYS TD PT72: 1.0000 | MEDICATED_PATCH | TRANSDERMAL | 12 refills | Status: AC

## 2021-01-05 MED ORDER — HEPARIN SODIUM (PORCINE) 5000 UNIT/ML IJ SOLN: 600.0000 [IU]/h | INTRAVENOUS | Status: AC

## 2021-01-05 MED ORDER — SODIUM CHLORIDE 0.9% FLUSH: 10.0000 mL | INTRAVENOUS | Status: AC | PRN

## 2021-01-05 MED ORDER — PANTOPRAZOLE SODIUM 40 MG PO TBEC: 40.0000 mg | DELAYED_RELEASE_TABLET | Freq: Every day | ORAL | Status: AC

## 2021-01-05 MED ORDER — DOCUSATE SODIUM 100 MG PO CAPS: 100.0000 mg | ORAL_CAPSULE | Freq: Two times a day (BID) | ORAL | 0 refills | Status: AC

## 2021-01-05 MED ORDER — INSULIN GLARGINE-YFGN 100 UNIT/ML ~~LOC~~ SOLN: 24.0000 [IU] | Freq: Every day | SUBCUTANEOUS | 11 refills | Status: AC

## 2021-01-05 MED ORDER — TRAMADOL HCL 50 MG PO TABS: 50.0000 mg | ORAL_TABLET | Freq: Four times a day (QID) | ORAL | Status: AC | PRN

## 2021-01-05 MED ORDER — INSULIN ASPART 100 UNIT/ML IJ SOLN: 0.0000 [IU] | Freq: Three times a day (TID) | INTRAMUSCULAR | 11 refills | Status: AC

## 2021-01-05 MED ORDER — HEPARIN (PORCINE) 25000 UT/250ML-% IV SOLN: 600.0000 [IU]/h | INTRAVENOUS | Status: AC

## 2021-01-05 MED ORDER — FUROSEMIDE 10 MG/ML IJ SOLN: 80.0000 mg | Freq: Every day | INTRAMUSCULAR | 0 refills | Status: AC

## 2021-01-05 MED ORDER — POTASSIUM CHLORIDE 20 MEQ PO PACK: 40.0000 meq | PACK | Freq: Two times a day (BID) | ORAL | Status: AC

## 2021-01-05 MED ORDER — DIGOXIN 125 MCG PO TABS: 0.1250 mg | ORAL_TABLET | Freq: Every day | ORAL | Status: AC

## 2021-01-05 MED ORDER — SODIUM CHLORIDE 0.9% FLUSH: 10.0000 mL | Freq: Two times a day (BID) | INTRAVENOUS | Status: AC

## 2021-01-05 MED ORDER — POLYETHYLENE GLYCOL 3350 17 G PO PACK: 17.0000 g | PACK | Freq: Every day | ORAL | 0 refills | Status: AC

## 2021-01-05 MED ORDER — AMIODARONE HCL IN DEXTROSE 360-4.14 MG/200ML-% IV SOLN: 30.0000 mg/h | INTRAVENOUS | Status: AC

## 2021-01-05 MED ORDER — SODIUM CHLORIDE 0.9% IV SOLUTION: 10.0000 mL | INTRAVENOUS | 0 refills | Status: AC | PRN

## 2021-01-05 MED ORDER — ADULT MULTIVITAMIN W/MINERALS CH: 1.0000 | ORAL_TABLET | Freq: Every day | ORAL | Status: AC

## 2021-01-05 MED ORDER — EPINEPHRINE HCL 5 MG/250ML IV SOLN IN NS: 5.0000 ug/min | INTRAVENOUS | Status: AC

## 2021-01-05 MED ORDER — ASPIRIN 81 MG PO TBEC: 81.0000 mg | DELAYED_RELEASE_TABLET | Freq: Every day | ORAL | 11 refills | Status: AC

## 2021-01-05 MED ORDER — SPIRONOLACTONE 25 MG PO TABS: 25.0000 mg | ORAL_TABLET | Freq: Every day | ORAL | Status: AC

## 2021-01-05 MED ORDER — PROSOURCE PLUS PO LIQD: 30.0000 mL | Freq: Three times a day (TID) | ORAL | Status: AC

## 2021-01-05 MED ORDER — NOREPINEPHRINE 16 MG/250ML-% IV SOLN: 0.0000 ug/min | INTRAVENOUS | Status: AC

## 2021-01-05 MED ORDER — MILRINONE LACTATE IN DEXTROSE 20-5 MG/100ML-% IV SOLN: 0.5000 ug/kg/min | INTRAVENOUS | Status: AC

## 2021-01-05 MED ORDER — TAMSULOSIN HCL 0.4 MG PO CAPS: 0.4000 mg | ORAL_CAPSULE | Freq: Every day | ORAL | Status: AC

## 2021-01-05 NOTE — Progress Notes (Addendum)
Patient ID: Dylan Chaney, male   DOB: 12-17-1970, 50 y.o.   MRN: [id number]    Advanced Heart Failure Rounding Note   Subjective:    - 10/11 Impella 5.5 placed - 12/22/20 Impllea turned up to P9 and milrinone increased to 0.5 mcg. Given dose of tolvaptan.  - 10/14 To OR for Impella site hematoma washout.  1-0/25 given 1 time dose of IV lasix. Pressors increased with no plan to wean.   Continues on Impella at P-9 Flow 4.9 LDH stable  424 On heparin  Remains On Epi 5, Milrinone 0.5, NE 35 mcg , amio 30 mg . CO-OX 46% Creatinine improved  Feels ok. Feeling stronger walking. Denies nausea.     Objective:   Weight Range:  Vital Signs:   Temp:  [98 F (36.7 C)-98.4 F (36.9 C)] 98 F (36.7 C) (10/26 0345) Pulse Rate:  [69-112] 95 (10/26 0700) Resp:  [11-28] 19 (10/26 0700) SpO2:  [83 %-100 %] 98 % (10/26 0700) Weight:  [90.6 kg] 90.6 kg (10/26 0500) Last BM Date: 01/03/21  Weight change: Filed Weights   01/03/21 0645 01/04/21 0500 01/05/21 0500  Weight: 88.6 kg 89.9 kg 90.6 kg    Intake/Output:   Intake/Output Summary (Last 24 hours) at 01/05/2021 0714 Last data filed at 01/05/2021 0700 Gross per 24 hour  Intake 4637.07 ml  Output 4045 ml  Net 592.07 ml  CVP 15 PHYSICAL EXAM: General:  Sitting in the chair.  No resp difficulty HEENT: normal Neck: supple.JVP 11-12 . Carotids 2+ bilat; no bruits. No lymphadenopathy or thryomegaly appreciated. Cor: PMI nondisplaced. Regular rate & rhythm. No rubs, gallops or murmurs. R axillary Impella.  Lungs: clear Abdomen: soft, nontender, nondistended. No hepatosplenomegaly. No bruits or masses. Good bowel sounds. Extremities: no cyanosis, clubbing, rash, R and LLE 2+ edema above unna boots.  Neuro: alert & orientedx3, cranial nerves grossly intact. moves all 4 extremities w/o difficulty. Affect pleasant  Telemetry: SR 90s with PVCs Labs: Basic Metabolic Panel: Recent Labs  Lab 01/01/21 0357 01/01/21 1400  01/02/21 0459 01/03/21 0514 01/03/21 1330 01/04/21 0350 01/05/21 0313  NA 126*   < > 128* 129* 127* 127* 128*  K 3.1*   < > 3.7 3.6 4.5 4.0 4.3  CL 88*   < > 89* 89* 88* 89* 94*  CO2 25   < > 28 30 29 29 27   GLUCOSE 130*   < > 169* 156* 160* 128* 117*  BUN 24*   < > 24* 22* 26* 32* 30*  CREATININE 1.32*   < > 1.30* 1.33* 1.46* 1.47* 1.32*  CALCIUM 8.6*   < > 8.8* 8.9 9.1 8.8* 8.7*  MG 2.2  --  2.1 2.1  --  2.0 2.1   < > = values in this interval not displayed.    Liver Function Tests: No results for input(s): AST, ALT, ALKPHOS, BILITOT, PROT, ALBUMIN in the last 168 hours.  No results for input(s): LIPASE, AMYLASE in the last 168 hours. No results for input(s): AMMONIA in the last 168 hours.  CBC: Recent Labs  Lab 01/01/21 0357 01/02/21 0459 01/03/21 0514 01/04/21 0350 01/05/21 0313  WBC 12.3* 11.2* 10.3 10.0 10.4  HGB 9.4* 9.7* 9.9* 9.6* 10.3*  HCT 28.2* 28.7* 29.6* 29.3* 31.4*  MCV 83.7 82.0 82.7 82.8 84.0  PLT 181 194 198 204 210    Cardiac Enzymes: No results for input(s): CKTOTAL, CKMB, CKMBINDEX, TROPONINI in the last 168 hours.   BNP: BNP (last 3 results)  Recent Labs    12/15/20 1716 12/17/20 0457  BNP 2,024.7* 1,742.2*    ProBNP (last 3 results) No results for input(s): PROBNP in the last 8760 hours.    Other results:  Imaging: No results found.   Medications:     Scheduled Medications:  (feeding supplement) PROSource Plus  30 mL Oral TID BM   aspirin EC  81 mg Oral Daily   Chlorhexidine Gluconate Cloth  6 each Topical Daily   digoxin  0.125 mg Oral Daily   docusate sodium  100 mg Oral BID   feeding supplement  1 Container Oral TID BM   furosemide  80 mg Intravenous Daily   insulin aspart  0-15 Units Subcutaneous TID WC   insulin aspart  0-5 Units Subcutaneous QHS   insulin aspart  3 Units Subcutaneous TID WC   insulin glargine-yfgn  24 Units Subcutaneous Daily   loratadine  10 mg Oral Daily   metoCLOPramide (REGLAN) injection  10  mg Intravenous Q8H   multivitamin with minerals  1 tablet Oral Daily   pantoprazole  40 mg Oral Daily   polyethylene glycol  17 g Oral Daily   potassium chloride  40 mEq Oral BID   scopolamine  1 patch Transdermal Q72H   sodium chloride flush  10-40 mL Intracatheter Q12H   spironolactone  25 mg Oral Daily   tamsulosin  0.4 mg Oral QPC supper    Infusions:  sodium chloride 10 mL/hr at 01/05/21 0700   amiodarone 30 mg/hr (01/05/21 0700)   epinephrine 5 mcg/min (01/05/21 0700)   heparin 50 units/mL (Impella PURGE) in dextrose 5 % 1000 mL bag 10.9 mL/hr at 12/21/20 1100   heparin 600 Units/hr (01/05/21 0700)   milrinone 0.5 mcg/kg/min (01/05/21 0700)   norepinephrine (LEVOPHED) Adult infusion 35 mcg/min (01/05/21 0700)    PRN Medications: sodium chloride, acetaminophen, bisacodyl, lip balm, oxyCODONE, sodium chloride flush, traMADol   Assessment/Plan:   1. 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 Will need coronary angio eventually - Impella 5.5 placed 10/11.  Speed increased to P9. Would continue current speed. Clear urine.  LDH stable at 397. Having purge flow alarms. Reviewed with Impella rep. Felt to be stable Will check position by echo today - Echo 10/23 RV function moderately reduced. Impella position ok 3.6 cm - Remains on milrinone 0.5 mcg +  Epi 5 + NE 35. CO-OX 46%.  - CVP 15-16. Continue  80 mg IV lasix  daily will follow up later with CVP. May need another dose. .  - He refuses Protek Duo support for RV (does not want 2 pumps). I discussed with him multiple times - Continue  25 mg spiro daily.  .- Given social situation advanced therapies would be challenging but if he does not recover with medical therapy, he has extensive support from [facility]. Continue  to f/u with medicaid application for transplant referral  - HCV quant is negative  - Blood type B+ - We spoke with Dr. Edwena Blow at [facility]. They would consider him for transplant evaluation (will need Medicaid first).  - D/w SW team. Medicaid still pending.   2. Acute PE  - CTA + bilateral segmental/subsegmental PE likely due to low output - continue heparin. No bleeding - Hgb stable.  3. Acute blood loss anemia - bleeding from Impella site on 10/11 => heparin stopped 10/14 and patient had hematoma evacuation by Dr. Dorris Fetch => heparin restarted.    - On heparin gtt for PE + Impella  - Hgb stable 10.3 .6 today. Monitor    4. Shock liver - improving with hemodynamic support   5. AKI on CKD 3B - baseline SCr 1.8-2.0 - due to ATN from shock  - Creatinine back to 1.3 today  - Decrease lasix. Keep MAP > 70  6. Thrombocytopenia - Resolved.  - HIT panel negative.  - SRA negative    - Doubt significant hemolysis at this point (stable LDH and rising plts), no evidence for HIT.    7. HCV - viral RNA Quant negative   8. 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 Hypervolemic Hyponatremia - 10/12, 10/14 Tolvaptan  - Na 128 today  - Restrict free water.   10. NSVT - continue amio gtt while on inotropes - keep K > 4.0 and Mg >2.0. Stable today.   11. Constipation  - Resolved w/ reglan and sorbitol   12. [facility] needs - Dr Gala Romney spoke with his caseworker April Anders [phone]). He was incarcerated almost a decade ago but not since. He has been homeless for years but recently moved into a hotel in June through the [facility]. 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 No substance use. Always first to volunteer to help other [employer] members move and do other tasks. [employer] members are very attached to him and said  they would drive him back and forth to [facility] for transplant eval,if needed. Other [employer] contact: Brother John: [phone]. His sister and his mother have been in to see and support him.   Await Medicaid. Once he has insurance consider transfer to [facility] for transplant eval. I am concerned that we may be running out of time due to RV failure but he refuses RV mechanical support.    Tonye Becket, NP  7:14 AM   Agree with above.  Remains on Impella at P-9 flows/waveforms ok and multiple pressors. Feeling better and SCr improved but co-ox still low. CVP still ~ 15. Lasix cut back to daily yesterday with rising SCr. Denies CP or SOB. Feels like he has more energy.  General:  Sitting in chair No resp difficulty HEENT: normal Neck: supple. JVP to ear  Carotids 2+ bilat; no bruits. No lymphadenopathy or thryomegaly appreciated. Cor: PMI nondisplaced. Irregular rate & rhythm. 2/6 TR  Impella site ok  Lungs: clear Abdomen: soft, nontender, nondistended. No hepatosplenomegaly. No bruits or masses. Good bowel sounds. Extremities: no cyanosis, clubbing, rash, 1-2+  edema Neuro: alert & orientedx3, cranial nerves grossly intact. moves all 4 extremities w/o difficulty. Affect pleasant  Remains tenuous on high-dose support. Still struggling with RV failure but end-organ function stable. Continue current regimen. May need second dose of lasix today. Will need to get in touch with Medicaid before he runs out of time.   CRITICAL CARE Performed by: Arvilla Meres  Total critical care time: 45 minutes  Critical care time was exclusive of separately billable procedures and treating other patients.  Critical care was necessary to treat or prevent imminent or life-threatening deterioration.  Critical care was time spent personally by me (  independent of midlevel providers or residents) on the following activities: development of treatment plan with patient and/or surrogate as well as nursing, discussions with  consultants, evaluation of patient's response to treatment, examination of patient, obtaining history from patient or surrogate, ordering and performing treatments and interventions, ordering and review of laboratory studies, ordering and review of radiographic studies, pulse oximetry and re-evaluation of patient's condition.  Arvilla Meres, MD  7:58 AM

## 2021-01-05 NOTE — TOC Progression Note (Signed)
Transition of Care [facility]) - Progression Note    Patient Details  Name: [name] MRN: [id number] Date of Birth: February 22, 1971  Transition of Care [facility]) CM/SW Contact  Mckaila Duffus, LCSWA Phone Number: 01/05/2021, 5:46 PM  Clinical Narrative:    HF CSW received a call from [facility] Outpatient HF CSW stating that the patient's Medicaid came through and that he has a Medicaid number. Jackie notified Dr. Gala Romney who shared with Mr. Dylan Chaney and his family regarding the Medicaid approval and then going to [facility] for the heart transplant.  CSW will continue to follow throughout discharge.   Expected Discharge Plan: Home w Home Health Services Barriers to Discharge: Continued Medical Work up  Expected Discharge Plan and Services Expected Discharge Plan: Home w Home Health Services In-house Referral: Clinical Social Work Discharge Planning Services: CM Consult Post Acute Care Choice: Home Health Living arrangements for the past 2 months: Hotel/Motel Expected Discharge Date: 01/05/21                                     Social Determinants of Health (SDOH) Interventions Financial Strain Interventions: Artist Housing Interventions: Other (Comment) (Pt. reports living at a [facility] [address] [place]) Transportation Interventions: [employer] [facility]  Readmission Risk Interventions No flowsheet data found.  Alondra Vandeven, MSW, LCSWA [phone] Heart Failure Social Worker

## 2021-01-05 NOTE — Plan of Care (Signed)
Paged re AO signal placement alarm with minimal pulsatility. Dylan Chaney was resting comfortably when I saw him with no change in his symptoms. Bedside echo performed. Inlet is just under 5 cm from AV. No changes made.

## 2021-01-05 NOTE — Progress Notes (Signed)
ANTICOAGULATION CONSULT NOTE - Follow Up Consult  Pharmacy Consult for Heparin Indication: Impella/ pulmonary embolus/ Impella  No Known Allergies  Patient Measurements: Height: 6\' 1"  (185.4 cm) Weight: 90.6 kg (199 lb 11.8 oz) IBW/kg (Calculated) : 79.9 Heparin Dosing Weight: 92.1 kg  Vital Signs: Temp: 98 F (36.7 C) (10/26 0345) Temp Source: Oral (10/26 0345) Pulse Rate: 93 (10/26 1000)  Labs: Recent Labs    01/03/21 0514 01/03/21 1330 01/04/21 0350 01/05/21 0313  HGB 9.9*  --  9.6* 10.3*  HCT 29.6*  --  29.3* 31.4*  PLT 198  --  204 210  HEPARINUNFRC 0.14*  --  <0.10* <0.10*  CREATININE 1.33* 1.46* 1.47* 1.32*     Estimated Creatinine Clearance: 74.8 mL/min (A) (by C-G formula based on SCr of 1.32 mg/dL (H)).   Medications:  Infusions:   sodium chloride 10 mL/hr at 01/05/21 1000   amiodarone 30 mg/hr (01/05/21 1000)   epinephrine 5 mcg/min (01/05/21 1000)   heparin 50 units/mL (Impella PURGE) in dextrose 5 % 1000 mL bag 10.9 mL/hr at 12/21/20 1100   heparin 600 Units/hr (01/05/21 1000)   milrinone 0.5 mcg/kg/min (01/05/21 1000)   norepinephrine (LEVOPHED) Adult infusion 33 mcg/min (01/05/21 1000)     Assessment: 50 yo male with acute bilateral PE, moderate clot burden. Pharmacy consulted to dose heparin drip for PE.  No prior AC noted.  Patient returned from surgery 10/11 s/p impella 5.5 placement. Patient taken to OR for evacuation 10/14. Oozing from impella site has improved. Minor nose bleed 10/19 and 10/23. Systemic heparin remains at a fixed rate of 600 units/hour.   Impella purge providing 575 units/hr of heparin (concentration 50 units/mL, flow 11.5 mL/hr). Heparin level this morning was subtherapeutic at <0.1, on systemic 600 units/hr. Hemoglobin and platelets stable. No bleeding reported. Will follow heart failure team's plan for heparin dosing in the setting of hematoma resolution.  Goal of Therapy:  Heparin level 0.3-0.5 units/ml for Impella +  PE Monitor platelets by anticoagulation protocol: Yes   Plan:  Continue IV heparin at 600 units/hr Impella purge providing 575 units/hour of heparin for total dose of 1175 units/hour Daily CBC and heparin level Monitor for signs of bleeding  Thank you for allowing pharmacy to participate in this patient's care.  11/23, PharmD PGY1 Pharmacy Resident 01/05/2021 11:06 AM Check [email or web address] for unit specific pharmacy number

## 2021-01-05 NOTE — Progress Notes (Signed)
Report called to Florentina Addison, RN, [id number] nurse at [facility]. Questions answered.

## 2021-01-05 NOTE — Discharge Summary (Addendum)
Advanced Heart Failure Team  Discharge Summary   Patient ID: Dylan Chaney MRN: [id number], DOB/AGE: 11-03-1970 50 y.o. Admit date: 12/15/2020 D/C date:     01/05/2021   Primary Discharge Diagnoses:  1. 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 Acute PE & bilateral LE DVT 3. Acute blood loss anemia 4. Shock liver 5. AKI on CKD 3B 6. Thrombocytopenia 7. H/O ETOH/Substance Abuse.  8. Hypervolemic Hyponatremia 9. NSVT 10. Constipation    Hospital Course:  Dylan Chaney is a 50 year old with history of substance abuse and subsequent prolonged incarceration, CKD IIIB, remote ETOH/drug abuse.    Presented to [facility] on 12/15/20 with chest pain and increased shortness of breath. CTA with distal bilateral PE and was placed on heparin drip. Echo completed with severely reduced EF <20%. Developed hypotension and tachycardia. Due to concern for  cardiogenic shock transferred to [facility] on 12/17/20 for Advanced HF Team consultation. Initial CO-OX 59% and lactic acid 1.8. Take to cath lab for RHC/swan placement. Coronary angio not done due to AKI initially. Initial hemodynamics:   RA = 14 RV = 48/18 PA = 51/23 (36) PCW = 29 Fick cardiac output/index = 4.5/2.11 Thermo CO/Ci = 4.6/2.2 PVR = 1.5 SVR = 1120  FA sat = 95%  PA sat = 63%, 69% PAPi = 2.0  Over the next few days, developed progressive shock. Placed on norepi and milrinone. Hemodynamics followed closely. Despite escalating pressors, developed refractory shock.   Given patient's social situation. (Drug free x 2 years. 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 [employer] case managers very supportive of patient and said they would drive him back and forth to [facility] if needed. Patient's mother and sister also involved.) Case was discussed with Dr. Edwena Blow who felt he might be possible transplant  candidate if we could get him Medicaid placement.   CT surgery consulted and placed  Impella 5.5 on 12/21/20. Impella placement complicated by hematoma. On 10/12 Impella increased to P9 with adequate flow. On 10/14 returned to the OR for hematoma washout. Despite Impella support was still very tenuous due to severe RV failure. Patient refused Protek Dup for RV mechanical support. Has been sustained on norepi 25-30 mcg, epi 5 mcg and milrinone 5 mcg. Mixed venous saturation has been in the 45-58% range. LDH stable in the high 300s   He was treated with IV lasix for diuresis but with RV failure was hard to get CVP < 15 due to severe RV failure     Also on amio drip for NSVT/ high PVC burden. He has been ambulating 740 feet daily with therapy. Please see below for family contacts.  See below for detailed problem list.    HFSW team helped with Medicaid application and approval. Medicaid approval obtained 10/26//22. Medicaid # is [id number]  Images power shared with [facility] Transferring to [facility] for transplant evaluation. Transfer was delayed until he was approved for Medicaid.   For breakdown by problem. See below.   1. 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 Will need coronary angio eventually - Impella 5.5 placed 10/11.  Speed increased to P9.  - Echo 10/23  RV function moderately reduced. Impella position ok 3.6 cm - Remains on milrinone 0.5 mcg +  Epi 5 + NE 32. CO-OX 46%.  - CVP 15-16 today. Continue  80 mg IV lasix daily.  .  - He refuses Protek Duo support for RV (does not want 2 pumps). Dr Gala Romney discussed  with him multiple times - Continue  25 mg spiro daily.  .- Given social situation advanced therapies would be challenging but if he does not recover with medical  therapy, he has extensive support from [employer]. Continue to f/u with medicaid application for transplant referral  - HCV quant is negative  - Blood type B+ - We spoke with Dr. Edwena Blow at [facility]. They would consider him for transplant evaluation (will need Medicaid first). See above for number.  2. Acute PE  - CTA + bilateral segmental/subsegmental PE likely due to low output - Maintained on heparin drip  - Hgb stable.   3. Acute blood loss anemia - bleeding from Impella site on 10/11 => heparin stopped 10/14 and patient had hematoma evacuation by Dr. Dorris Fetch => heparin restarted.    - On heparin gtt for PE + Impella  - Hgb stable 10.3   4. Shock liver - improving with hemodynamic support  5. AKI on CKD 3B - baseline SCr 1.8-2.0 - due to ATN from shock  - Creatinine back to 1.3 today  6. Thrombocytopenia - Resolved.  - HIT panel negative.  - SRA negative    - Doubt significant hemolysis at this point (stable LDH and rising plts), no evidence for HIT.  7. HCV - viral RNA Quant negative  8. 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 Hypervolemic Hyponatremia - 10/12, 10/14 Tolvaptan  - Na 128 today  - Restrict free water.  10. NSVT - continue amio gtt while on inotropes - keep K > 4.0 and Mg >2.0. Stable today.   11. Constipation  - Resolved w/ reglan and sorbitol   12. [facility] needs - Dr Gala Romney spoke with his caseworker April Anders [phone]). He was incarcerated almost a decade ago but not since. He has been homeless for years but recently moved into a hotel in June through the [facility]. 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 No substance use. Always first to volunteer to help other IRC members move and do other tasks. IRC members are very attached to him and said they would drive him back and forth to  [facility] for transplant eval,if needed. Other IRC contact: Brother John: [phone].).  His sister , Gust Rung( [phone]) and his mother have been in to see and support him.   Discharge Vitals: Blood pressure (!) 72/34, pulse 95, temperature 97.6 F (36.4 C), temperature source Oral, resp. rate (!) 22, height 6\' 1"  (1.854 m), weight 90.6 kg, SpO2 100 %.  Labs: Lab Results  Component Value Date   WBC 10.4 01/05/2021   HGB 10.3 (L) 01/05/2021   HCT 31.4 (L) 01/05/2021   MCV 84.0 01/05/2021   PLT 210 01/05/2021    Recent Labs  Lab 01/05/21 0313  NA 128*  K 4.3  CL 94*  CO2 27  BUN 30*  CREATININE 1.32*  CALCIUM 8.7*  GLUCOSE 117*   No results found for: CHOL, HDL, LDLCALC, TRIG BNP (  last 3 results) Recent Labs    12/15/20 1716 12/17/20 0457  BNP 2,024.7* 1,742.2*    ProBNP (last 3 results) No results for input(s): PROBNP in the last 8760 hours.   Diagnostic Studies/Procedures  RHC 12/17/2020   RA = 14 RV = 48/18 PA = 51/23 (36) PCW = 29 Fick cardiac output/index = 4.5/2.11 Thermo CO/Ci = 4.6/2.2 PVR = 1.5 SVR = 1120  FA sat = 95%  PA sat = 63%, 69%  Assessment: 1. Elevated biventricular pressures with moderately reduced output  Echo 12/17/2020 1. Left ventricular ejection fraction, by estimation, is <20%. The left  ventricle has severely decreased function. The left ventricle demonstrates  global hypokinesis. The left ventricular internal cavity size was severely  dilated. Left ventricular  diastolic parameters are consistent with Grade III diastolic dysfunction  (restrictive).   2. Right ventricular systolic function is severely reduced. The right  ventricular size is mildly enlarged. There is moderately elevated  pulmonary artery systolic pressure. The estimated right ventricular  systolic pressure is 48.6 mmHg.   3. Left atrial size was severely dilated.   4. Right atrial size was severely dilated.   5. Functional Dylan from dilated LV. The mitral  valve is normal in  structure. Severe mitral valve regurgitation. No evidence of mitral  stenosis.   6. Tricuspid valve regurgitation is moderate.   7. The aortic valve is normal in structure. Aortic valve regurgitation is  trivial. No aortic stenosis is present.   8. The inferior vena cava is normal in size with greater than 50%  respiratory variability, suggesting right atrial pressure of 3 mmHg.   Discharge Medications   Allergies as of 01/05/2021   No Known Allergies      Medication List     STOP taking these medications    omeprazole 20 MG capsule Commonly known as: PRILOSEC Replaced by: pantoprazole 40 MG tablet   promethazine 25 MG tablet Commonly known as: PHENERGAN       TAKE these medications    (feeding supplement) PROSource Plus liquid Take 30 mLs by mouth 3 (three) times daily between meals.   acetaminophen 325 MG tablet Commonly known as: TYLENOL Take 2 tablets (650 mg total) by mouth every 6 (six) hours as needed for mild pain.   amiodarone 360-4.14 MG/200ML-% Soln Commonly known as: NEXTERONE PREMIX Inject 30 mg/hr into the vein continuous.   aspirin 81 MG EC tablet Take 1 tablet (81 mg total) by mouth daily. Swallow whole. Start taking on: January 06, 2021   bisacodyl 5 MG EC tablet Commonly known as: DULCOLAX Take 2 tablets (10 mg total) by mouth daily as needed for moderate constipation.   Chlorhexidine Gluconate Cloth 2 % Pads Apply 6 each topically daily.   digoxin 0.125 MG tablet Commonly known as: LANOXIN Take 1 tablet (0.125 mg total) by mouth daily. Start taking on: January 06, 2021   docusate sodium 100 MG capsule Commonly known as: COLACE Take 1 capsule (100 mg total) by mouth 2 (two) times daily.   EPINEPHrine NaCl premix infusion Inject 5 mcg/min into the vein continuous.   furosemide 10 MG/ML injection Commonly known as: LASIX Inject 8 mLs (80 mg total) into the vein daily. Start taking on: January 06, 2021    heparin 48546 UT/250ML infusion Inject 600 Units/hr into the vein continuous.   heparin 50,000 Units in dextrose 5 % 990 mL 600 Units/hr by Intracatheter route continuous.   insulin aspart 100 UNIT/ML injection Commonly known as: novoLOG Inject  0-5 Units into the skin at bedtime.   insulin aspart 100 UNIT/ML injection Commonly known as: novoLOG Inject 0-15 Units into the skin 3 (three) times daily with meals. Start taking on: January 06, 2021   insulin aspart 100 UNIT/ML injection Commonly known as: novoLOG Inject 3 Units into the skin 3 (three) times daily with meals. Start taking on: January 06, 2021   insulin glargine-yfgn 100 UNIT/ML injection Commonly known as: SEMGLEE Inject 0.24 mLs (24 Units total) into the skin daily. Start taking on: January 06, 2021   lip balm ointment Apply topically as needed for lip care.   loratadine 10 MG tablet Commonly known as: CLARITIN Take 1 tablet (10 mg total) by mouth daily. Start taking on: January 06, 2021   metoCLOPramide 5 MG/ML injection Commonly known as: REGLAN Inject 2 mLs (10 mg total) into the vein every 8 (eight) hours.   milrinone 20 MG/100 ML Soln infusion Commonly known as: PRIMACOR Inject 0.0448 mg/min into the vein continuous.   multivitamin with minerals Tabs tablet Take 1 tablet by mouth daily. Start taking on: January 06, 2021   norepinephrine 16-5 MG/250ML-% Soln Commonly known as: LEVOPHED Inject 0-40 mcg/min into the vein continuous.   oxyCODONE 5 MG immediate release tablet Commonly known as: Oxy IR/ROXICODONE Take 1 tablet (5 mg total) by mouth 2 (two) times daily as needed for moderate pain.   pantoprazole 40 MG tablet Commonly known as: PROTONIX Take 1 tablet (40 mg total) by mouth daily. Start taking on: January 06, 2021 Replaces: omeprazole 20 MG capsule   polyethylene glycol 17 g packet Commonly known as: MIRALAX / GLYCOLAX Take 17 g by mouth daily. Start taking on: January 06, 2021    potassium chloride 20 MEQ packet Commonly known as: KLOR-CON Take 40 mEq by mouth 2 (two) times daily.   scopolamine 1 MG/3DAYS Commonly known as: TRANSDERM-SCOP Place 1 patch (1.5 mg total) onto the skin every 3 (three) days. Start taking on: January 08, 2021   sodium chloride 0.9 % infusion Inject 10 mLs into the artery as needed (Impella arterial line).   sodium chloride flush 0.9 % Soln Commonly known as: NS 10-40 mLs by Intracatheter route every 12 (twelve) hours.   sodium chloride flush 0.9 % Soln Commonly known as: NS 10-40 mLs by Intracatheter route as needed (flush).   spironolactone 25 MG tablet Commonly known as: ALDACTONE Take 1 tablet (25 mg total) by mouth daily. Start taking on: January 06, 2021   tamsulosin 0.4 MG Caps capsule Commonly known as: FLOMAX Take 1 capsule (0.4 mg total) by mouth daily after supper. What changed:  how much to take when to take this   traMADol 50 MG tablet Commonly known as: ULTRAM Take 1 tablet (50 mg total) by mouth every 6 (six) hours as needed for moderate pain.        Disposition   The patient will be discharged to [facility] with Impella .  Discharge Instructions     Diet - low sodium heart healthy   Complete by: As directed    Increase activity slowly   Complete by: As directed           Duration of Discharge Encounter: Greater than 35 minutes   Signed, Amy Clegg NP-C  01/05/2021, 5:57 PM  Patient seen and examined with the above-signed Advanced Practice Provider and/or Housestaff. I personally reviewed laboratory data, imaging studies and relevant notes. I independently examined the patient and formulated the important aspects of the plan.  I have edited the note to reflect any of my changes or salient points. I have personally discussed the plan with the patient and/or family.  Agree with above. I have edited d/c summary with my changes.   Arvilla Meres, MD  6:32 PM

## 2021-01-05 NOTE — Progress Notes (Signed)
Physical Therapy Treatment Patient Details Name: Dylan Chaney MRN: [id number] DOB: 1970-06-03 Today's Date: 01/05/2021   History of Present Illness Pt is a 50 y.o. male admitted 12/17/20 with chest pain, SOB. Chest CT with bilateral PE. Echo showed EF < 20%. Decision made for mechanical support as possible bridge to transplant. Now s/p placement of Impella left ventricular assist device 10/11. S/p impella hematoma evacuation 10/15. PMH includes remote drug and alcohol abuse.    PT Comments    Pt making gradual progress. Session focused on improving activity level and exercises.  Cues for correct form with exercises.  RN assisting with Impella management. All VSS.  Goals extended and updated.    Recommendations for follow up therapy are one component of a multi-disciplinary discharge planning process, led by the attending physician.  Recommendations may be updated based on patient status, additional functional criteria and insurance authorization.  Follow Up Recommendations  Home health PT     Assistance Recommended at Discharge Intermittent Supervision/Assistance  Equipment Recommendations  3in1 (PT);Rollator (4 wheels)    Recommendations for Other Services       Precautions / Restrictions Precautions Precautions: Fall;Other (comment) Precaution Comments: Impella, LIJ CVC Restrictions RUE Weight Bearing: Partial weight bearing RUE Partial Weight Bearing Percentage or Pounds: R axillary impella     Mobility  Bed Mobility               General bed mobility comments: pt OOB in recliner at start and end of session    Transfers Overall transfer level: Modified independent Equipment used: Rolling walker (2 wheels) Transfers: Sit to/from Stand Sit to Stand: Modified independent (Device/Increase time)           General transfer comment: Assist for lines    Ambulation/Gait Ambulation/Gait assistance: Supervision;+2 safety/equipment Gait Distance (Feet): 390  Feet Assistive device: Rolling walker (2 wheels) Gait Pattern/deviations: Step-through pattern     General Gait Details: Slow but steady gait; Assist of 2 for impella management; VSS   Stairs             Wheelchair Mobility    Modified Rankin (Stroke Patients Only)       Balance Overall balance assessment: Needs assistance Sitting-balance support: Feet supported Sitting balance-Leahy Scale: Good     Standing balance support: No upper extremity supported Standing balance-Leahy Scale: Good Standing balance comment: Uses RW to ambulate for support but able to stand , weight shift, exercise without UE support                            Cognition Arousal/Alertness: Awake/alert Behavior During Therapy: WFL for tasks assessed/performed Overall Cognitive Status: Within Functional Limits for tasks assessed                                          Exercises Other Exercises Other Exercises: Standing lunges, squats, heel raises x 10; cues for controlled motion and corect form; assist to maintain lines    General Comments General comments (skin integrity, edema, etc.): VSS on RA      Pertinent Vitals/Pain Pain Assessment: No/denies pain    Home Living                          Prior Function            PT Goals (  current goals can now be found in the care plan section) Acute Rehab PT Goals Patient Stated Goal: Just get back on my feet PT Goal Formulation: With patient Time For Goal Achievement: 01/19/21 Potential to Achieve Goals: Good Progress towards PT goals: Progressing toward goals    Frequency    Min 3X/week      PT Plan Current plan remains appropriate    Co-evaluation              AM-PAC PT "6 Clicks" Mobility   Outcome Measure  Help needed turning from your back to your side while in a flat bed without using bedrails?: None Help needed moving from lying on your back to sitting on the side of a  flat bed without using bedrails?: None Help needed moving to and from a bed to a chair (including a wheelchair)?: None Help needed standing up from a chair using your arms (e.g., wheelchair or bedside chair)?: None Help needed to walk in hospital room?: A Little Help needed climbing 3-5 steps with a railing? : A Little 6 Click Score: 22    End of Session Equipment Utilized During Treatment: Gait belt Activity Tolerance: Patient tolerated treatment well Patient left: with call bell/phone within reach;in chair;with family/visitor present Nurse Communication: Mobility status PT Visit Diagnosis: Unsteadiness on feet (R26.81);Difficulty in walking, not elsewhere classified (R26.2)     Time: 2025-4270 PT Time Calculation (min) (ACUTE ONLY): 21 min  Charges:  $Therapeutic Exercise: 8-22 mins                     Dylan Chaney, PT Acute Rehab Services Pager [phone] [facility] [phone]    Rayetta Humphrey 01/05/2021, 3:30 PM

## 2021-01-06 LAB — SARS CORONAVIRUS 2 (TAT 6-24 HRS): SARS Coronavirus 2: NEGATIVE

## 2021-01-06 NOTE — Progress Notes (Signed)
CSW assisting with obtaining medicaid approval for transfer to [facility] for transplant. CSW spoke with Melissa unit #9 Supervisor at [place] [phone] who shared that she was aware of the case but had not yet received formal referral from the county. CSW then contacted Ida Rogue at [facility] in [facility] [phone] and shared the need to forward the forms for referral to DDS. Caseworker assisted with her supervisor to get the referral process and forwarded to DDS in [place]. CSW later assisted with requested documents to DDS per request and followed up with both DDS and DSS. CSW received request for additional pay stubs and was able to provide to Ms. McBroom at [place] via Tillie Fantasia [phone] at the [facility]. CSW received call from Ms. McBroom late this afternoon with medicaid approval (Medicaid #[id number]).  CSW informed Dr. Haroldine Laws and met with patient, sister and mother at bedside to make them aware of the approval. Patient was grateful for all the support and the approval of medicaid. CSW available if needed further. Raquel Sarna, [place], [place]

## 2021-01-13 ENCOUNTER — Telehealth (HOSPITAL_COMMUNITY): Payer: Self-pay | Admitting: Licensed Clinical Social Worker

## 2021-01-13 NOTE — Telephone Encounter (Signed)
Patient's sister called to ask questions regarding disability application. She states she has received paperwork to be completed and asking for some assistance to review and fax. Patient's sister Kendal Hymen will come by [facility] on Tuesday January 18, 2021 to meet with CSW. CSW informed sister that [facility] was assisting with the disability and provided contact information.  She reports patient is doing well at [facility] and keeping his spirits up. CSW continues to be available. Lasandra Beech, LCSW, CCSW-MCS [phone]

## 2022-04-19 ENCOUNTER — Emergency Department (HOSPITAL_COMMUNITY): Payer: Medicaid Other

## 2022-04-19 ENCOUNTER — Encounter (HOSPITAL_COMMUNITY): Payer: Self-pay

## 2022-04-19 ENCOUNTER — Other Ambulatory Visit: Payer: Self-pay

## 2022-04-19 ENCOUNTER — Emergency Department (HOSPITAL_COMMUNITY)
Admission: EM | Admit: 2022-04-19 | Discharge: 2022-04-20 | Disposition: A | Discharge status: Other Acute Inpatient Hospital | Payer: Medicaid Other | Attending: Emergency Medicine | Admitting: Emergency Medicine

## 2022-04-19 DIAGNOSIS — Z20822 Contact with and (suspected) exposure to covid-19: Secondary | ICD-10-CM | POA: Diagnosis not present

## 2022-04-19 DIAGNOSIS — R000 Tachycardia, unspecified: Secondary | ICD-10-CM | POA: Diagnosis not present

## 2022-04-19 DIAGNOSIS — R059 Cough, unspecified: Secondary | ICD-10-CM | POA: Diagnosis present

## 2022-04-19 DIAGNOSIS — N179 Acute kidney failure, unspecified: Secondary | ICD-10-CM | POA: Diagnosis not present

## 2022-04-19 DIAGNOSIS — Z794 Long term (current) use of insulin: Secondary | ICD-10-CM | POA: Insufficient documentation

## 2022-04-19 DIAGNOSIS — J210 Acute bronchiolitis due to respiratory syncytial virus: Secondary | ICD-10-CM | POA: Insufficient documentation

## 2022-04-19 HISTORY — DX: Essential (primary) hypertension: I10

## 2022-04-19 LAB — BASIC METABOLIC PANEL
Anion gap: 12 (ref 5–15)
BUN: 16 mg/dL (ref 6–20)
CO2: 23 mmol/L (ref 22–32)
Calcium: 9.3 mg/dL (ref 8.9–10.3)
Chloride: 99 mmol/L (ref 98–111)
Creatinine, Ser: 2.11 mg/dL — ABNORMAL HIGH (ref 0.61–1.24)
GFR, Estimated: 37 mL/min — ABNORMAL LOW (ref >60)
Glucose, Bld: 160 mg/dL — ABNORMAL HIGH (ref 70–99)
Potassium: 5.1 mmol/L (ref 3.5–5.1)
Sodium: 134 mmol/L — ABNORMAL LOW (ref 135–145)

## 2022-04-19 LAB — RESP PANEL BY RT-PCR (RSV, FLU A&B, COVID)  RVPGX2
Influenza A by PCR: NEGATIVE
Influenza B by PCR: NEGATIVE
Resp Syncytial Virus by PCR: POSITIVE — AB
SARS Coronavirus 2 by RT PCR: NEGATIVE

## 2022-04-19 LAB — URINALYSIS, ROUTINE W REFLEX MICROSCOPIC
Bilirubin Urine: NEGATIVE
Glucose, UA: NEGATIVE mg/dL
Hgb urine dipstick: NEGATIVE
Ketones, ur: NEGATIVE mg/dL
Leukocytes,Ua: NEGATIVE
Nitrite: NEGATIVE
Protein, ur: 30 mg/dL — AB
Specific Gravity, Urine: 1.019 (ref 1.005–1.030)
pH: 5 (ref 5.0–8.0)

## 2022-04-19 LAB — CBC
HCT: 36.2 % — ABNORMAL LOW (ref 39.0–52.0)
Hemoglobin: 12 g/dL — ABNORMAL LOW (ref 13.0–17.0)
MCH: 26.6 pg (ref 26.0–34.0)
MCHC: 33.1 g/dL (ref 30.0–36.0)
MCV: 80.3 fL (ref 80.0–100.0)
Platelets: 277 10*3/uL (ref 150–400)
RBC: 4.51 MIL/uL (ref 4.22–5.81)
RDW: 12.9 % (ref 11.5–15.5)
WBC: 8.1 10*3/uL (ref 4.0–10.5)
nRBC: 0 % (ref 0.0–0.2)

## 2022-04-19 MED ORDER — METOCLOPRAMIDE HCL 5 MG/ML IJ SOLN
10.0000 mg | Freq: Once | INTRAMUSCULAR | Status: AC
  Administered 2022-04-19: 10 mg via INTRAVENOUS
  Filled 2022-04-19: qty 2

## 2022-04-19 MED ORDER — LACTATED RINGERS IV BOLUS
1000.0000 mL | Freq: Once | INTRAVENOUS | Status: AC
  Administered 2022-04-19: 1000 mL via INTRAVENOUS

## 2022-04-19 NOTE — ED Notes (Signed)
Report called to Chipper Herb at [place].

## 2022-04-19 NOTE — ED Provider Notes (Signed)
[facility] [place] Provider Note   CSN: [id number] Arrival date & time: 04/19/22  1740     History  Chief Complaint  Patient presents with   Cough   Fever    Titus Drone is a 52 y.o. male.  HPI Presents with cough, fatigue, weakness.  History is most notable for prior cardiac transplant, November, 2022.  He notes over the past 2 weeks has had increasing fatigue, cough, inability to get relief in spite of multiple medication trials. He receives cardiac care [employer] and here in [place]. He is unsure of fever. He has been taking his immunosuppressants.     Home Medications Prior to Admission medications   Medication Sig Start Date End Date Taking? Authorizing Provider  acetaminophen (TYLENOL) 325 MG tablet Take 2 tablets (650 mg total) by mouth every 6 (six) hours as needed for mild pain. 01/05/21   Clegg, Amy D, NP  amiodarone (NEXTERONE PREMIX) 360-4.14 MG/200ML-% SOLN Inject 30 mg/hr into the vein continuous. 01/05/21   Clegg, Amy D, NP  aspirin EC 81 MG EC tablet Take 1 tablet (81 mg total) by mouth daily. Swallow whole. 01/06/21   Clegg, Amy D, NP  bisacodyl (DULCOLAX) 5 MG EC tablet Take 2 tablets (10 mg total) by mouth daily as needed for moderate constipation. 01/05/21   Clegg, Amy D, NP  Chlorhexidine Gluconate Cloth 2 % PADS Apply 6 each topically daily. 01/05/21   Clegg, Amy D, NP  digoxin (LANOXIN) 0.125 MG tablet Take 1 tablet (0.125 mg total) by mouth daily. 01/06/21   Clegg, Amy D, NP  docusate sodium (COLACE) 100 MG capsule Take 1 capsule (100 mg total) by mouth 2 (two) times daily. 01/05/21   Clegg, Amy D, NP  EPINEPHrine HCl-NaCl (EPINEPHRINE NACL) premix infusion Inject 5 mcg/min into the vein continuous. 01/05/21   Clegg, Amy D, NP  furosemide (LASIX) 10 MG/ML injection Inject 8 mLs (80 mg total) into the vein daily. 01/06/21   Clegg, Amy D, NP  heparin 25000 UT/250ML infusion Inject 600 Units/hr into the vein  continuous. 01/05/21   Clegg, Amy D, NP  heparin 50,000 Units in dextrose 5 % 990 mL 600 Units/hr by Intracatheter route continuous. 01/05/21   Clegg, Amy D, NP  insulin aspart (NOVOLOG) 100 UNIT/ML injection Inject 0-5 Units into the skin at bedtime. 01/05/21   Clegg, Amy D, NP  insulin aspart (NOVOLOG) 100 UNIT/ML injection Inject 0-15 Units into the skin 3 (three) times daily with meals. 01/06/21   Clegg, Amy D, NP  insulin aspart (NOVOLOG) 100 UNIT/ML injection Inject 3 Units into the skin 3 (three) times daily with meals. 01/06/21   Clegg, Amy D, NP  insulin glargine-yfgn (SEMGLEE) 100 UNIT/ML injection Inject 0.24 mLs (24 Units total) into the skin daily. 01/06/21   Clegg, Amy D, NP  lip balm (CARMEX) ointment Apply topically as needed for lip care. 01/05/21   Clegg, Amy D, NP  loratadine (CLARITIN) 10 MG tablet Take 1 tablet (10 mg total) by mouth daily. 01/06/21   Clegg, Amy D, NP  metoCLOPramide (REGLAN) 5 MG/ML injection Inject 2 mLs (10 mg total) into the vein every 8 (eight) hours. 01/05/21   Clegg, Amy D, NP  milrinone (PRIMACOR) 20 MG/100 ML SOLN infusion Inject 0.0448 mg/min into the vein continuous. 01/05/21   Clegg, Amy D, NP  Multiple Vitamin (MULTIVITAMIN WITH MINERALS) TABS tablet Take 1 tablet by mouth daily. 01/06/21   Darrick Grinder D, NP  norepinephrine (  LEVOPHED) 16-5 MG/250ML-% SOLN Inject 0-40 mcg/min into the vein continuous. 01/05/21   Clegg, Amy D, NP  Nutritional Supplements (,FEEDING SUPPLEMENT, PROSOURCE PLUS) liquid Take 30 mLs by mouth 3 (three) times daily between meals. 01/05/21   Clegg, Amy D, NP  oxyCODONE (OXY IR/ROXICODONE) 5 MG immediate release tablet Take 1 tablet (5 mg total) by mouth 2 (two) times daily as needed for moderate pain. 01/05/21   Clegg, Amy D, NP  pantoprazole (PROTONIX) 40 MG tablet Take 1 tablet (40 mg total) by mouth daily. 01/06/21   Clegg, Amy D, NP  polyethylene glycol (MIRALAX / GLYCOLAX) 17 g packet Take 17 g by mouth daily. 01/06/21    Clegg, Amy D, NP  potassium chloride (KLOR-CON) 20 MEQ packet Take 40 mEq by mouth 2 (two) times daily. 01/05/21   Clegg, Amy D, NP  scopolamine (TRANSDERM-SCOP) 1 MG/3DAYS Place 1 patch (1.5 mg total) onto the skin every 3 (three) days. 01/08/21   Clegg, Amy D, NP  sodium chloride 0.9 % infusion Inject 10 mLs into the artery as needed (Impella arterial line). 01/05/21   Clegg, Amy D, NP  sodium chloride flush (NS) 0.9 % SOLN 10-40 mLs by Intracatheter route every 12 (twelve) hours. 01/05/21   Clegg, Amy D, NP  sodium chloride flush (NS) 0.9 % SOLN 10-40 mLs by Intracatheter route as needed (flush). 01/05/21   Clegg, Amy D, NP  spironolactone (ALDACTONE) 25 MG tablet Take 1 tablet (25 mg total) by mouth daily. 01/06/21   Clegg, Amy D, NP  tamsulosin (FLOMAX) 0.4 MG CAPS capsule Take 1 capsule (0.4 mg total) by mouth daily after supper. 01/05/21   Clegg, Amy D, NP  traMADol (ULTRAM) 50 MG tablet Take 1 tablet (50 mg total) by mouth every 6 (six) hours as needed for moderate pain. 01/05/21   Tonye Becket D, NP      Allergies    Patient has no known allergies.    Review of Systems   Review of Systems  All other systems reviewed and are negative.   Physical Exam Updated Vital Signs BP 112/80   Pulse (!) 114   Temp 99.2 F (37.3 C)   Resp 20   Ht 6\' 1"  (1.854 m)   Wt 81.6 kg   SpO2 97%   BMI 23.75 kg/m  Physical Exam Vitals and nursing note reviewed.  Constitutional:      General: He is not in acute distress.    Appearance: He is well-developed.  HENT:     Head: Normocephalic and atraumatic.  Eyes:     Conjunctiva/sclera: Conjunctivae normal.  Cardiovascular:     Rate and Rhythm: Regular rhythm. Tachycardia present.  Pulmonary:     Effort: Respiratory distress present.     Breath sounds: No stridor.  Abdominal:     General: There is no distension.  Skin:    General: Skin is warm and dry.  Neurological:     Mental Status: He is alert and oriented to person, place, and time.      ED Results / Procedures / Treatments   Labs (all labs ordered are listed, but only abnormal results are displayed) Labs Reviewed  RESP PANEL BY RT-PCR (RSV, FLU A&B, COVID)  RVPGX2 - Abnormal; Notable for the following components:      Result Value   Resp Syncytial Virus by PCR POSITIVE (*)    All other components within normal limits  BASIC METABOLIC PANEL - Abnormal; Notable for the following components:   Sodium  134 (*)    Glucose, Bld 160 (*)    Creatinine, Ser 2.11 (*)    GFR, Estimated 37 (*)    All other components within normal limits  CBC - Abnormal; Notable for the following components:   Hemoglobin 12.0 (*)    HCT 36.2 (*)    All other components within normal limits  URINALYSIS, ROUTINE W REFLEX MICROSCOPIC - Abnormal; Notable for the following components:   APPearance HAZY (*)    Protein, ur 30 (*)    Bacteria, UA RARE (*)    All other components within normal limits  CBG MONITORING, ED    EKG EKG Interpretation  Date/Time:  Wednesday April 19 2022 18:01:07 EST Ventricular Rate:  116 PR Interval:  151 QRS Duration: 74 QT Interval:  285 QTC Calculation: 396 R Axis:   47 Text Interpretation: Sinus tachycardia Probable left atrial enlargement Borderline T abnormalities, anterior leads Artifact in lead(s) I III aVR aVL aVF Abnormal ECG Confirmed by Carmin Muskrat [phone]) on 04/19/2022 8:46:08 PM  Radiology DG Chest 2 View  Result Date: 04/19/2022 CLINICAL DATA:  Productive cough, fever, and chest congestion for 2 weeks. Heart transplant patient. EXAM: CHEST - 2 VIEW COMPARISON:  12/24/2020 FINDINGS: The heart size and mediastinal contours are within normal limits. Prior median sternotomy. Both lungs are clear. The visualized skeletal structures are unremarkable. IMPRESSION: No active cardiopulmonary disease. Electronically Signed   By: Marlaine Hind M.D.   On: 04/19/2022 18:32    Procedures Procedures    Medications Ordered in ED Medications  lactated  ringers bolus 1,000 mL (has no administration in time range)  metoCLOPramide (REGLAN) injection 10 mg (has no administration in time range)    ED Course/ Medical Decision Making/ A&P                             Medical Decision Making Due to suppressed adult male with history of cardiac transplant presents with cough, fatigue, is found to have mild fever, is tachycardic, concern for bacteremia, sepsis, pneumonia.  Patient placed on cardiac monitors, received fluid resuscitation, though judiciously given his history of heart failure. Patient tachycardic, without appreciable hypoxia.  Cardiac 115 sinus tach abnormal Pulse ox 97% room air normal   Amount and/or Complexity of Data Reviewed External Data Reviewed: notes.    Details: Going management of heart transplant from [facility]: ordered. Decision-making details documented in ED Course. Radiology: ordered and independent interpretation performed. Decision-making details documented in ED Course.    Details: Unremarkable chest x-ray ECG/medicine tests: ordered and independent interpretation performed. Decision-making details documented in ED Course.  Risk Prescription drug management.   9:40 PM With concern for RSV positive, acute kidney injury discussed this case with our [employer].  10:25 PM Patient aware of all findings, I discussed results with him and his sister via video chat, patient [facility] - Dr. Katy Apo / Gwynneth Aliment         Final Clinical Impression(s) / ED Diagnoses Final diagnoses:  RSV (acute bronchiolitis due to respiratory syncytial virus)  AKI (acute kidney injury) ([place])  CRITICAL CARE Performed by: Carmin Muskrat Total critical care time: 40 minutes Critical care time was exclusive of separately billable procedures and treating other patients. Critical care was necessary to treat or prevent imminent or  life-threatening deterioration. Critical care was time spent personally by me on the following activities: development of  treatment plan with patient and/or surrogate as well as nursing, discussions with consultants, evaluation of patient's response to treatment, examination of patient, obtaining history from patient or surrogate, ordering and performing treatments and interventions, ordering and review of laboratory studies, ordering and review of radiographic studies, pulse oximetry and re-evaluation of patient's condition.    Carmin Muskrat, MD 04/19/22 2226

## 2022-04-19 NOTE — ED Provider Triage Note (Signed)
Emergency Medicine Provider Triage Evaluation Note  Demir Titsworth , a 52 y.o. male  was evaluated in triage.  Pt complains of cold sxs. Report cough, sob, congestion, and fatigue x 2 weeks.  Cough is productive. Did report n/v/d first week which resolved.  Has heart transplant 2 years ago  Review of Systems  Positive: As above Negative: As above  Physical Exam  BP 127/86 (BP Location: Right Arm)   Pulse (!) 116   Temp 99.2 F (37.3 C)   Resp 18   Ht 6\' 1"  (1.854 m)   Wt 81.6 kg   SpO2 96%   BMI 23.75 kg/m  Gen:   Awake, no distress   Resp:  Normal effort  MSK:   Moves extremities without difficulty  Other:    Medical Decision Making  Medically screening exam initiated at 5:48 PM.  Appropriate orders placed.  Shaw Dobek was informed that the remainder of the evaluation will be completed by another provider, this initial triage assessment does not replace that evaluation, and the importance of remaining in the ED until their evaluation is complete.     Domenic Moras, PA-C 04/19/22 1753

## 2022-04-19 NOTE — ED Triage Notes (Signed)
Pt bib ems from home; c/o productive cough and congestion x 2 weeks; heart transplant pt Nov 2022,; also c/o generalized pain, back aches, n/v/ fever; 128/86, HR 116, 95% RA, RR 20; endorses known sick contacts

## 2022-04-19 NOTE — ED Notes (Signed)
[facility] called for transport. 

## 2022-04-20 DIAGNOSIS — J210 Acute bronchiolitis due to respiratory syncytial virus: Secondary | ICD-10-CM | POA: Diagnosis not present

## 2022-04-20 DIAGNOSIS — R000 Tachycardia, unspecified: Secondary | ICD-10-CM | POA: Diagnosis not present

## 2022-04-20 DIAGNOSIS — N179 Acute kidney failure, unspecified: Secondary | ICD-10-CM | POA: Diagnosis not present

## 2022-04-20 DIAGNOSIS — Z794 Long term (current) use of insulin: Secondary | ICD-10-CM | POA: Diagnosis not present

## 2022-04-20 DIAGNOSIS — Z20822 Contact with and (suspected) exposure to covid-19: Secondary | ICD-10-CM | POA: Diagnosis not present

## 2022-04-20 DIAGNOSIS — R059 Cough, unspecified: Secondary | ICD-10-CM | POA: Diagnosis present

## 2022-04-20 NOTE — ED Notes (Signed)
Report given to [facility]. 

## 2023-07-18 IMAGING — DX DG ABD PORTABLE 1V
1 series · 1 of 1 positions shown · non-contrast
Comparison: CT abdomen/pelvis December 16, 2020.

CLINICAL DATA: Ileus (HCC) KYV.G (AEH-QE-CM)

EXAM:
PORTABLE ABDOMEN - 1 VIEW

[abdomen]
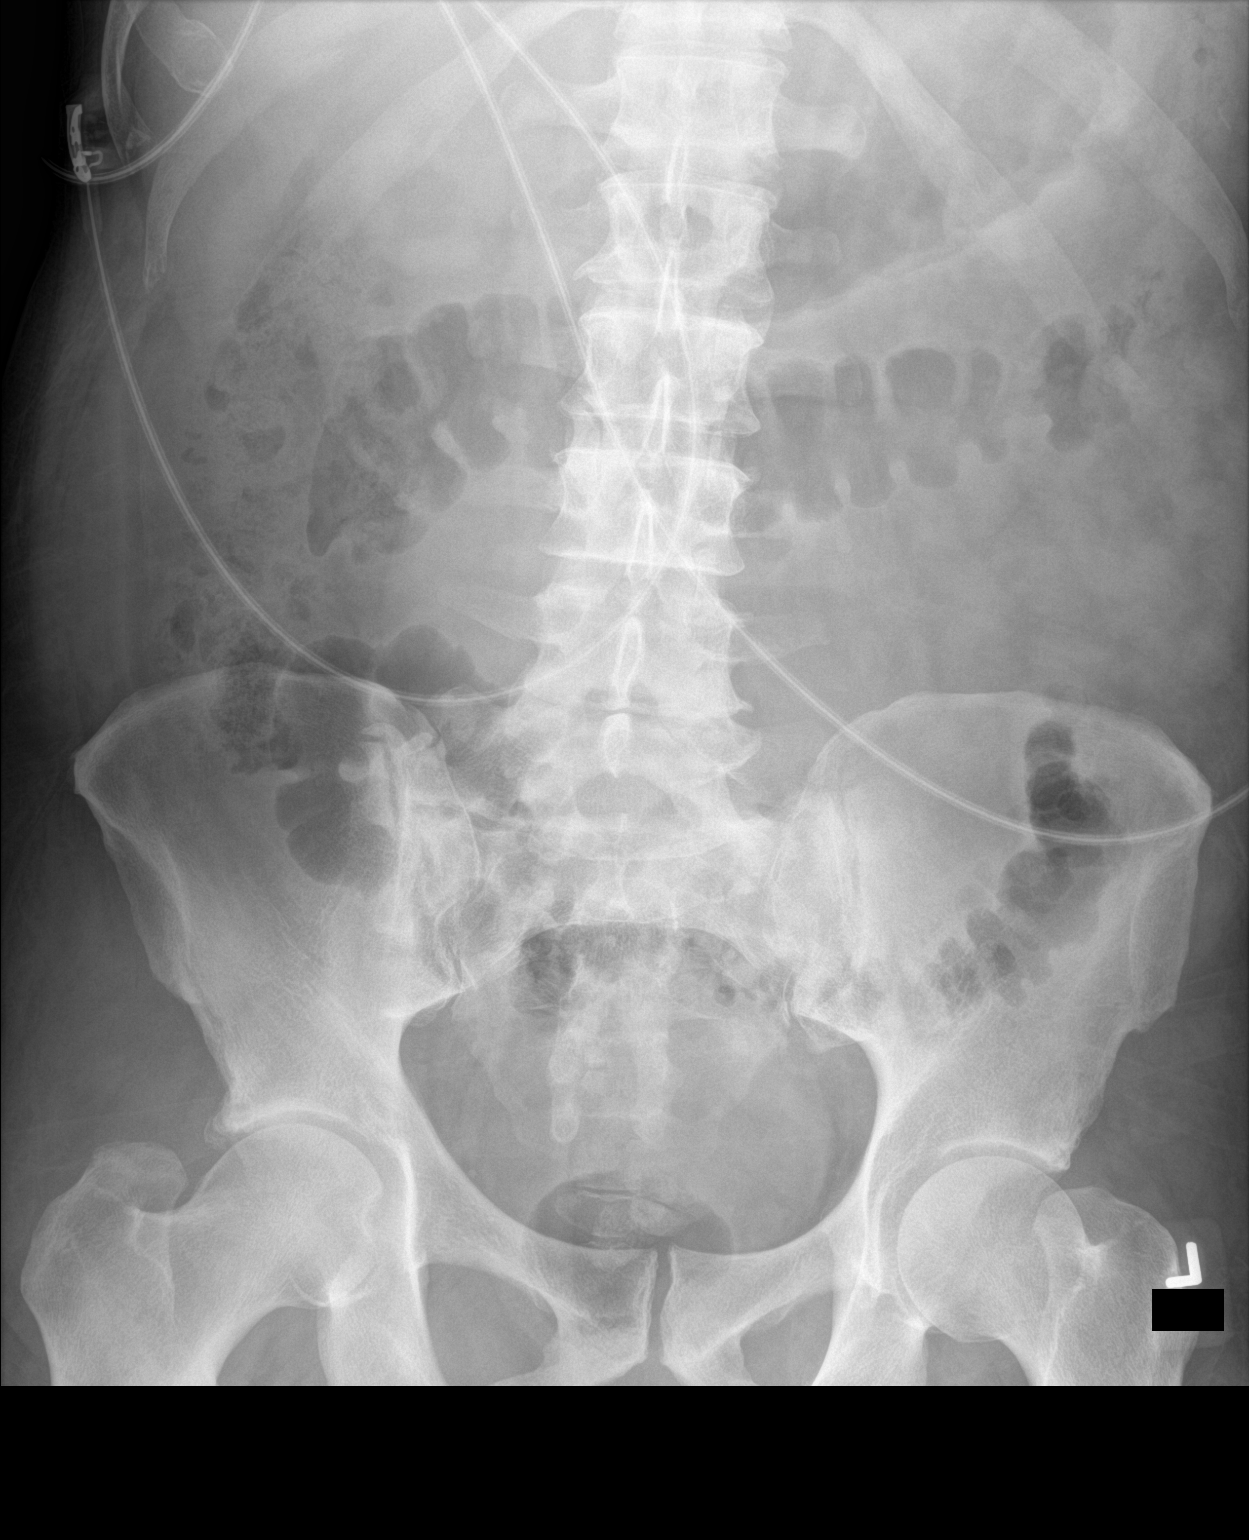

[1 of 1 positions shown; findings below may reference images not displayed]

FINDINGS: Gas with nondilated colon with paucity of small bowel gas. No
dilated bowel loops to suggest obstruction. No obvious evidence of
free air on this supine radiograph. No abnormal calcifications or
mass effect. Lower lumbar degenerative change.
IMPRESSION: Gas with nondilated colon with paucity of small bowel gas. No
dilated bowel loops to suggest obstruction.

## 2023-07-18 IMAGING — DX DG CHEST 1V PORT
1 series · 1 of 1 positions shown · non-contrast
Comparison: December 17, 2020.

CLINICAL DATA: CHF (congestive heart failure) (HCC)
([id number])

EXAM:
PORTABLE CHEST 1 VIEW

[chest]
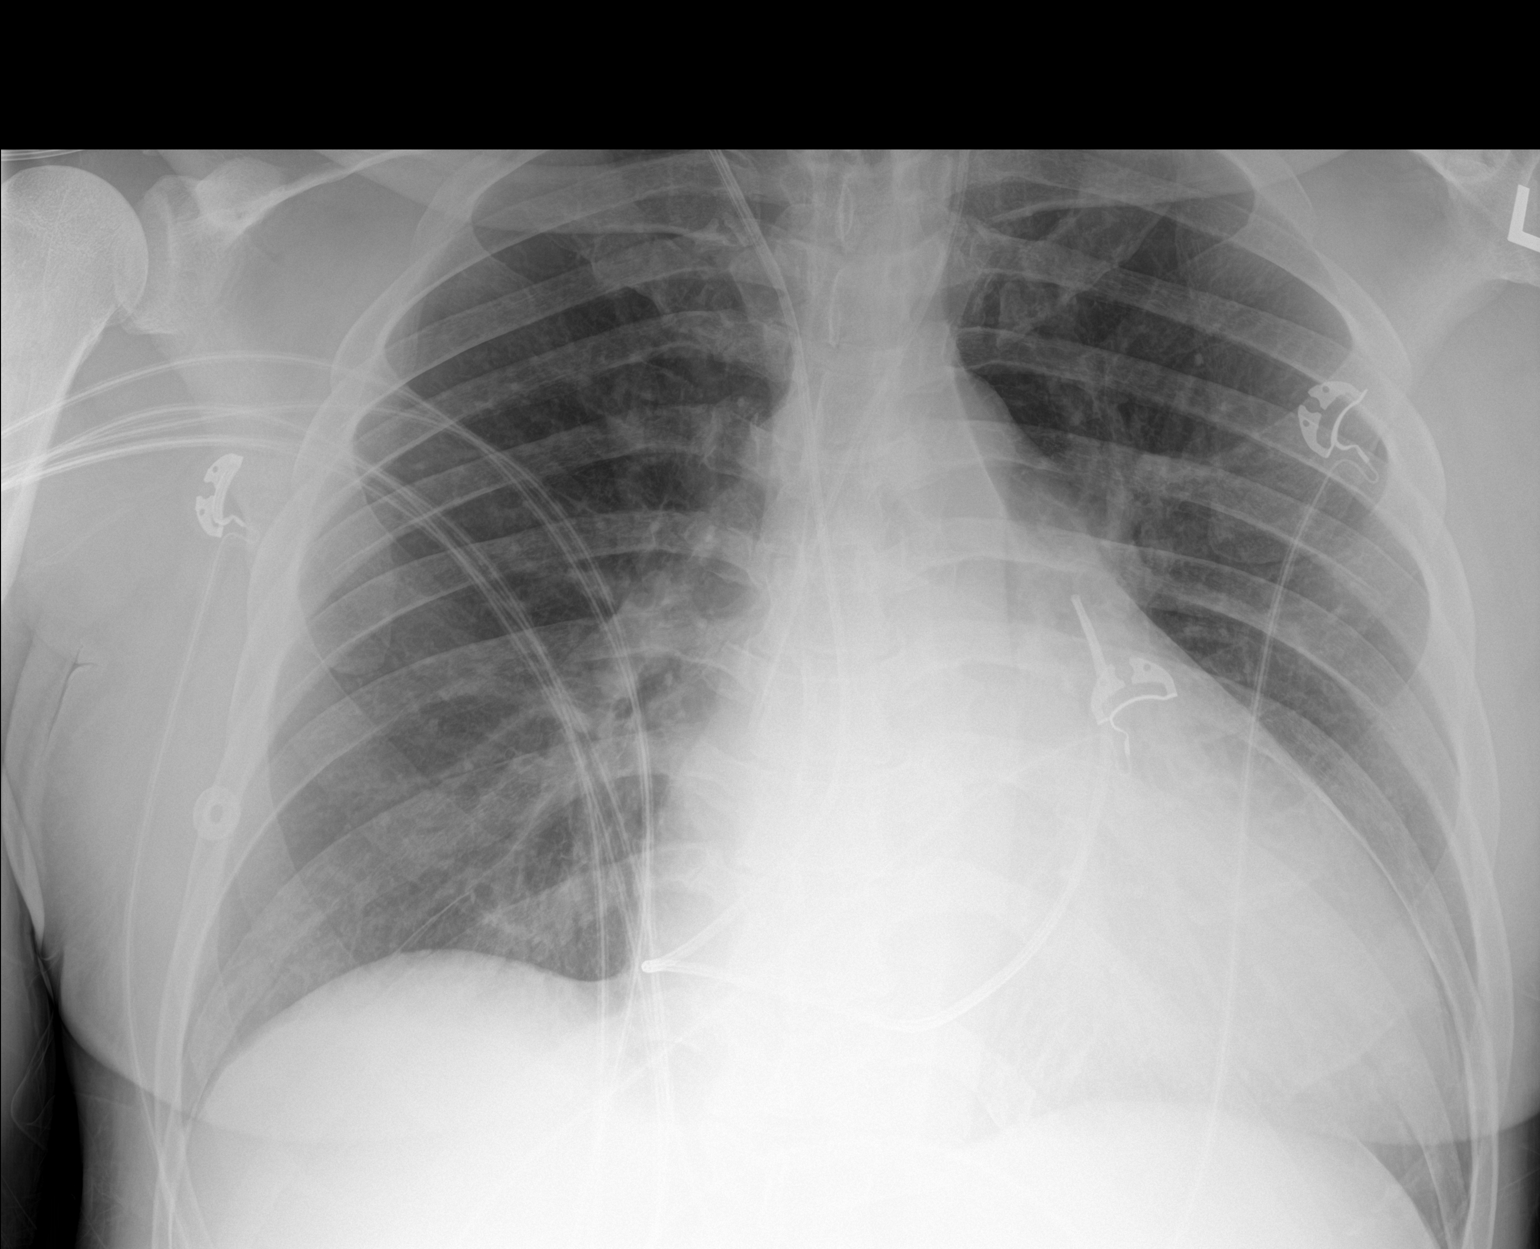

[1 of 1 positions shown; findings below may reference images not displayed]

FINDINGS: Enlarged cardiac silhouette, similar. Swan-Ganz catheter tip
projects in the expected region of the main pulmonary artery.
Similar positioning of a left IJ approach central venous catheter
with the tip projecting at the superior cavoatrial junction. No
consolidation. No visible pleural effusions or pneumothorax.
IMPRESSION: Similar marked cardiomegaly. No evidence of new acute
cardiopulmonary disease.

## 2023-07-19 IMAGING — RF DG CHEST 1V
1 series · 1 of 1 positions shown · non-contrast
Comparison: December 20, 2020.

CLINICAL DATA: Placement of Impella device.

EXAM:
CHEST  1 VIEW; DG C-ARM 1-60 MIN-NO REPORT
Radiation exposure index: 19.83 mGy.

[Series 1: run · 1 of 1 slices shown]
[im 1/1]
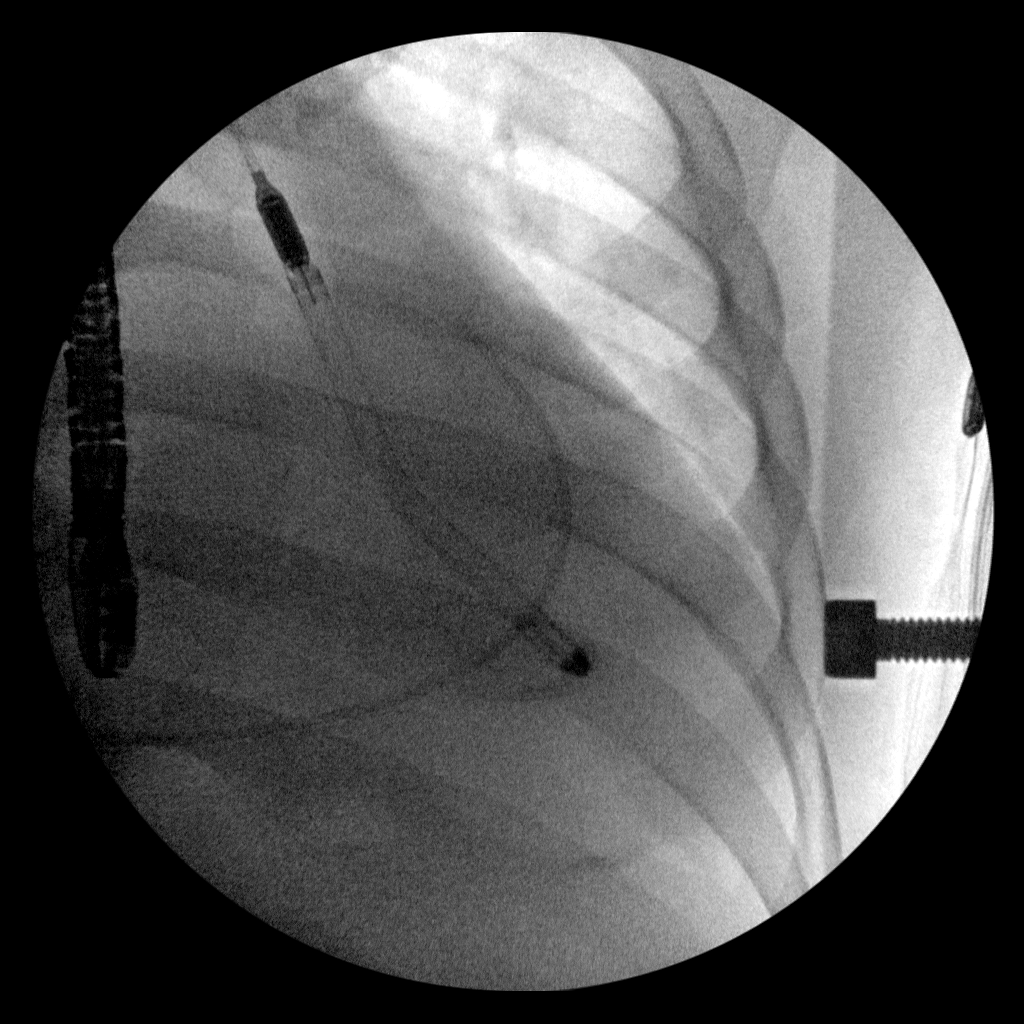

[1 of 1 positions shown; findings below may reference images not displayed]

FINDINGS: One intraoperative fluoroscopic image of the left chest was
obtained. This demonstrates an Impella device projected over the
cardiac silhouette.
IMPRESSION: Fluoroscopic guidance provided during Impella device placement.

## 2023-07-20 IMAGING — DX DG CHEST 1V PORT
1 series · 1 of 1 positions shown · non-contrast
Comparison: 12/21/2020 and 12/20/2020.

CLINICAL DATA: Atelectasis, chest pain.

EXAM:
PORTABLE CHEST 1 VIEW

[chest]
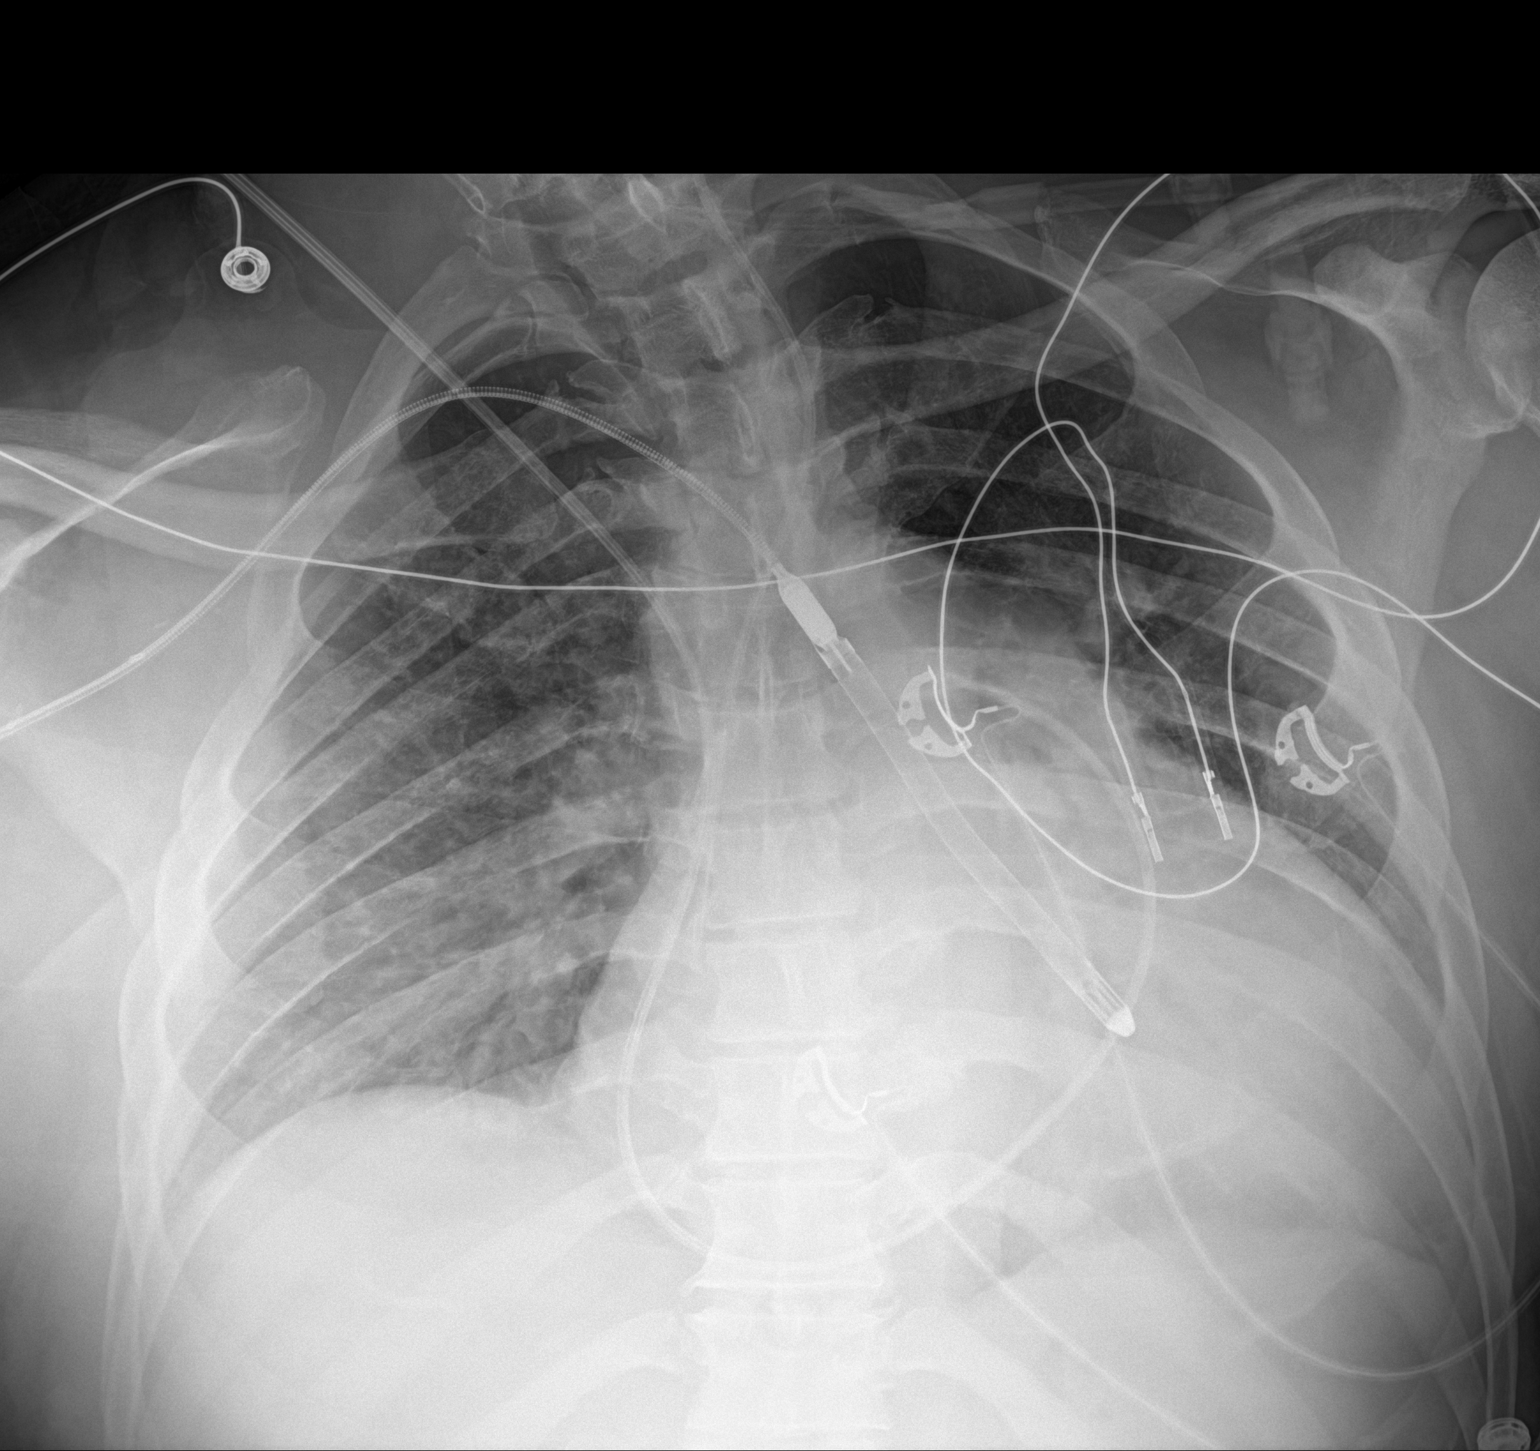

[1 of 1 positions shown; findings below may reference images not displayed]

FINDINGS: Left IJ central line tip is at the SVC RA junction. Right IJ
Swan-Ganz catheter tip is in the expected location of the proximal
right pulmonary artery. Left ventricular Impella device is in place.
The pigtail tip is not visualized. Heart is enlarged. Mild pulmonary
vascular congestion versus is interstitial prominence and
indistinctness. No definite pleural fluid. No pneumothorax.
IMPRESSION: 1. Left ventricular Impella device in place. Pigtail tip is not
visualized.
2. Pulmonary vascular congestion versus mild pulmonary edema.

## 2023-07-22 IMAGING — DX DG CHEST 1V PORT
1 series · 1 of 1 positions shown · non-contrast
Comparison: 12/22/2020

CLINICAL DATA: Atelectasis.

EXAM:
PORTABLE CHEST 1 VIEW

[chest ap]
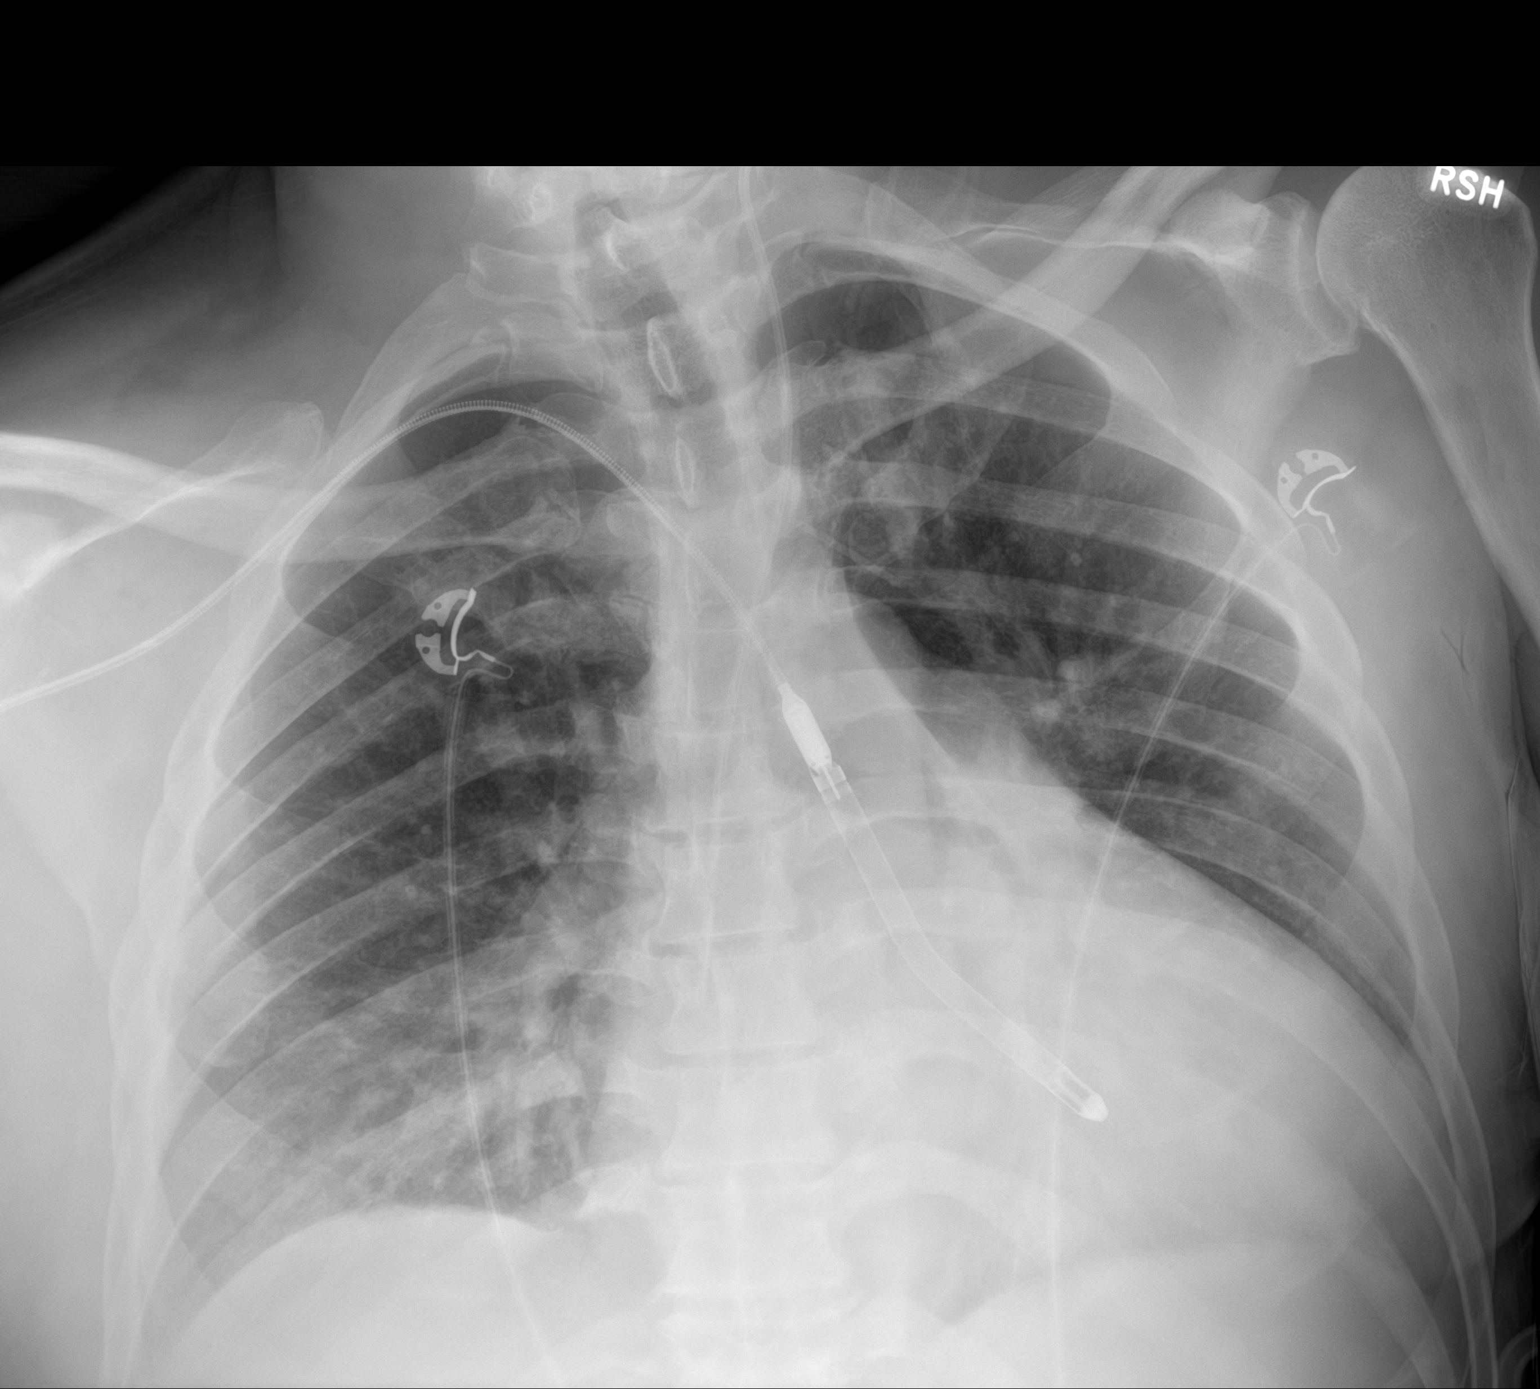

[1 of 1 positions shown; findings below may reference images not displayed]

FINDINGS: 2853 hours. Low lung volumes. The cardio pericardial silhouette is
enlarged. Bibasilar atelectasis noted with vascular congestion. Left
IJ central line tip overlies the distal SVC level. Impella device
again noted. Telemetry leads overlie the chest.
IMPRESSION: Low volume film with vascular congestion and bibasilar atelectasis.
